# Patient Record
Sex: Male | Born: 1965
Health system: Southern US, Community
[De-identification: ages and names within clinical notes are randomized; demographics above are authoritative.]

## PROBLEM LIST (undated history)

## (undated) DIAGNOSIS — T7840XA Allergy, unspecified, initial encounter: Secondary | ICD-10-CM

## (undated) DIAGNOSIS — E119 Type 2 diabetes mellitus without complications: Secondary | ICD-10-CM

## (undated) DIAGNOSIS — R400 Somnolence: Principal | ICD-10-CM

## (undated) HISTORY — DX: Allergy, unspecified, initial encounter: T78.40XA

## (undated) HISTORY — DX: Somnolence: R40.0

---
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
## 2009-03-25 ENCOUNTER — Encounter (INDEPENDENT_AMBULATORY_CARE_PROVIDER_SITE_OTHER): Payer: Self-pay | Admitting: *Deleted

## 2009-04-11 ENCOUNTER — Encounter (INDEPENDENT_AMBULATORY_CARE_PROVIDER_SITE_OTHER): Payer: Self-pay | Admitting: *Deleted

## 2009-04-12 ENCOUNTER — Ambulatory Visit: Payer: Self-pay | Admitting: Gastroenterology

## 2009-04-16 ENCOUNTER — Encounter (INDEPENDENT_AMBULATORY_CARE_PROVIDER_SITE_OTHER): Payer: Self-pay | Admitting: *Deleted

## 2009-05-13 ENCOUNTER — Ambulatory Visit: Payer: Self-pay | Admitting: Gastroenterology

## 2010-06-10 NOTE — Procedures (Signed)
Summary: Colonoscopy  Patient: Alex Watson Note: All result statuses are Final unless otherwise noted.  Tests: (1) Colonoscopy (COL)   COL Colonoscopy           DONE     [facility]     [address]     [place], [place]  [address]           COLONOSCOPY PROCEDURE REPORT           PATIENT:  Armour, Villanueva  MR#:  [id number]     BIRTHDATE:  November 02, 1965, 43 yrs. old  GENDER:  male           ENDOSCOPIST:  Vania Rea. Jarold Motto, MD, [facility]     Referred by:           PROCEDURE DATE:  05/13/2009     PROCEDURE:  Higher-risk screening colonoscopy G0105           ASA CLASS:  Class I     INDICATIONS:  family history of colon cancer, history of     pre-cancerous (adenomatous) colon polyps           MEDICATIONS:   Fentanyl 100 mcg IV, Versed 10 mg IV           DESCRIPTION OF PROCEDURE:   After the risks benefits and     alternatives of the procedure were thoroughly explained, informed     consent was obtained.  Digital rectal exam was performed and     revealed no abnormalities.   The LB [id number]-[id number] endoscope     was introduced through the anus and advanced to the cecum, which     was identified by both the appendix and ileocecal valve, without     limitations.  The quality of the prep was excellent, using     Nulytley.  The instrument was then slowly withdrawn as the colon     was fully examined.     <<PROCEDUREIMAGES>>           FINDINGS:  External hemorrhoids were found.  No polyps or cancers     were seen.  This was otherwise a normal examination of the colon.     Retroflexed views in the rectum revealed no abnormalities.    The     scope was then withdrawn from the patient and the procedure     completed.           COMPLICATIONS:  None           ENDOSCOPIC IMPRESSION:     1) External hemorrhoids     2) No polyps or cancers     3) Otherwise normal examination     RECOMMENDATIONS:     1) high fiber diet     2) Given your significant family history of colon cancer,  you     should have a repeat colonoscopy in 5 years           REPEAT EXAM:  No           ______________________________     Vania Rea. Jarold Motto, MD, [facility]           CC:           n.     eSIGNED:   Vania Rea. Raima Geathers at 05/13/2009 11:04 AM           Olga Coaster, [id number]  Note: An exclamation mark (!) indicates a result that was not dispersed into the flowsheet. Document Creation Date:  05/13/2009 11:04 AM _______________________________________________________________________  (1) Order result status: Final Collection or observation date-time: 05/13/2009 10:48 Requested date-time:  Receipt date-time:  Reported date-time:  Referring Physician:   Ordering Physician: Sheryn Bison [phone]) Specimen Source:  Source: Launa Grill Order Number: [id number] Lab site:   Appended Document: Colonoscopy    Clinical Lists Changes  Observations: Added new observation of COLONNXTDUE: 05/2014 (05/13/2009 12:48)      Appended Document: Colonoscopy     Procedures Next Due Date:    Colonoscopy: 05/2014

## 2012-05-09 ENCOUNTER — Encounter: Payer: Self-pay | Admitting: Physician Assistant

## 2012-05-16 ENCOUNTER — Ambulatory Visit (INDEPENDENT_AMBULATORY_CARE_PROVIDER_SITE_OTHER): Payer: BC Managed Care – PPO | Admitting: Emergency Medicine

## 2012-05-16 ENCOUNTER — Ambulatory Visit: Payer: BC Managed Care – PPO

## 2012-05-16 ENCOUNTER — Encounter: Payer: Self-pay | Admitting: Emergency Medicine

## 2012-05-16 VITALS — BP 104/74 | HR 80 | Temp 98.7°F | Resp 16 | Ht 75.5 in | Wt 211.0 lb

## 2012-05-16 DIAGNOSIS — F17200 Nicotine dependence, unspecified, uncomplicated: Secondary | ICD-10-CM

## 2012-05-16 DIAGNOSIS — Z0000 Encounter for general adult medical examination without abnormal findings: Secondary | ICD-10-CM

## 2012-05-16 DIAGNOSIS — R0683 Snoring: Secondary | ICD-10-CM

## 2012-05-16 LAB — CBC WITH DIFFERENTIAL/PLATELET
Basophils Absolute: 0.1 10*3/uL (ref 0.0–0.1)
Basophils Relative: 1 % (ref 0–1)
Eosinophils Relative: 1 % (ref 0–5)
HCT: 45.7 % (ref 39.0–52.0)
MCHC: 35.4 g/dL (ref 30.0–36.0)
Monocytes Absolute: 0.7 10*3/uL (ref 0.1–1.0)
Neutro Abs: 5 10*3/uL (ref 1.7–7.7)
Platelets: 337 10*3/uL (ref 150–400)
RDW: 13.9 % (ref 11.5–15.5)

## 2012-05-16 LAB — POCT URINALYSIS DIPSTICK
Glucose, UA: NEGATIVE
Ketones, UA: NEGATIVE
Leukocytes, UA: NEGATIVE
Nitrite, UA: NEGATIVE

## 2012-05-16 LAB — POCT UA - MICROSCOPIC ONLY: Yeast, UA: NEGATIVE

## 2012-05-16 LAB — COMPREHENSIVE METABOLIC PANEL
AST: 15 U/L (ref 0–37)
Alkaline Phosphatase: 76 U/L (ref 39–117)
BUN: 14 mg/dL (ref 6–23)
Creat: 0.9 mg/dL (ref 0.50–1.35)
Potassium: 4.1 mEq/L (ref 3.5–5.3)

## 2012-05-16 LAB — LIPID PANEL
HDL: 34 mg/dL — ABNORMAL LOW (ref >39)
LDL Cholesterol: 108 mg/dL — ABNORMAL HIGH (ref 0–99)
Triglycerides: 74 mg/dL (ref <150)
VLDL: 15 mg/dL (ref 0–40)

## 2012-05-16 LAB — IFOBT (OCCULT BLOOD): IFOBT: NEGATIVE

## 2012-05-16 MED ORDER — VARENICLINE TARTRATE 1 MG PO TABS: 1.0000 mg | ORAL_TABLET | Freq: Two times a day (BID) | ORAL | Status: DC

## 2012-05-16 MED ORDER — VARENICLINE TARTRATE 0.5 MG PO TABS: 0.5000 mg | ORAL_TABLET | Freq: Two times a day (BID) | ORAL | Status: DC

## 2012-05-16 NOTE — Progress Notes (Deleted)
  Subjective:    Patient ID: Alex Watson, male    DOB: [birth year], 47 y.o.   MRN: [id number]  HPI    Review of Systems     Objective:   Physical Exam        Assessment & Plan:

## 2012-05-16 NOTE — Progress Notes (Signed)
  Subjective:    Patient ID: Alex Watson, male    DOB: [birth year], 47 y.o.   MRN: [id number]  HPI    Review of Systems  Constitutional: Positive for fatigue.  HENT: Negative.   Eyes: Positive for visual disturbance.  Respiratory: Positive for cough.   Cardiovascular: Negative.   Gastrointestinal: Positive for constipation.  Genitourinary: Negative.   Musculoskeletal: Positive for myalgias and arthralgias.  Skin: Negative.   Neurological: Negative.   Hematological: Negative.   Psychiatric/Behavioral: Negative.        Objective:   Physical Exam HEENT exam is normal. Pupils equal round regular react to light direct and consensual extraocular muscle movements are intact. Cranial nerves II through XII intact. Gums are inflamed. The pharynx is clear no oral lesions are seen. Neck is supple. Chest is clear to both auscultation and percussion. Cardiac exam reveals a regular rate with a 1/6 systolic murmur at the apical area of the heart and the abdomen is soft liver spleen are not enlarged there no areas of tenderness. GU exam reveals a normal male testicles descended no hernia felt. Echo exam reveals a normal prostate heme sure was done . There is tenderness over the left lateral epicondyles with pain with extension of the left wrist.  [facility] reading (PRIMARY) by  Dr. Cleta Alberts no acute disease Results for orders placed in visit on 05/16/12  IFOBT (OCCULT BLOOD)      Component Value Range   IFOBT Negative    POCT UA - MICROSCOPIC ONLY      Component Value Range   WBC, Ur, HPF, POC 0-2     RBC, urine, microscopic 0-2     Bacteria, U Microscopic neg     Mucus, UA neg     Epithelial cells, urine per micros 0-1     Crystals, Ur, HPF, POC neg     Casts, Ur, LPF, POC neg     Yeast, UA neg    POCT URINALYSIS DIPSTICK      Component Value Range   Color, UA yellow     Clarity, UA clear     Glucose, UA neg     Bilirubin, UA small     Ketones, UA neg     Spec Grav, UA 1.020     Blood, UA neg      pH, UA 6.0     Protein, UA neg     Urobilinogen, UA 0.2     Nitrite, UA neg     Leukocytes, UA Negative     PFT normal      Assessment & Plan:  Patient had routine labs done but I have added celiac panel because of a family history of celiac disease. We'll try Chantix to see if we can get him off cigarettes. Referral is made to the sleep center for evaluation of sleep disorder. Regarding his left elbow pain I suggested he get an elbow strap and ice the area around the lateral epicondyle after work.

## 2012-05-17 LAB — GLIA (IGA/G) + TTG IGA
Gliadin IgA: 12.3 U/mL (ref <20)
Gliadin IgG: 67.6 U/mL — ABNORMAL HIGH (ref <20)
Tissue Transglutaminase Ab, IgA: 6.3 U/mL (ref <20)

## 2012-05-26 ENCOUNTER — Telehealth: Payer: Self-pay | Admitting: Radiology

## 2012-05-26 NOTE — Telephone Encounter (Signed)
Referrals received back the referral request for this patient because not enough in formation in chart for snoring as the diagnosis for the sleep study please let me know when it is added so i can resend the information so they can schedule Please advise, is there any further information we can provide to get this scheduled for him, you may need to do an addendum to the office visit.

## 2012-05-27 NOTE — Telephone Encounter (Signed)
Can you add this information in? Or do we need to do an addendum?

## 2012-05-27 NOTE — Telephone Encounter (Signed)
The patient is also having difficulty with sleeping. He also has issues with fatigue during the day. The study will be done to rule out sleep apnea.

## 2012-09-08 ENCOUNTER — Ambulatory Visit: Payer: BC Managed Care – PPO

## 2012-09-08 ENCOUNTER — Ambulatory Visit (INDEPENDENT_AMBULATORY_CARE_PROVIDER_SITE_OTHER): Payer: BC Managed Care – PPO | Admitting: Family Medicine

## 2012-09-08 VITALS — BP 114/60 | HR 87 | Temp 98.3°F | Resp 16 | Ht 77.5 in | Wt 223.0 lb

## 2012-09-08 DIAGNOSIS — M25462 Effusion, left knee: Secondary | ICD-10-CM

## 2012-09-08 DIAGNOSIS — M25562 Pain in left knee: Secondary | ICD-10-CM

## 2012-09-08 DIAGNOSIS — M25469 Effusion, unspecified knee: Secondary | ICD-10-CM

## 2012-09-08 DIAGNOSIS — M25569 Pain in unspecified knee: Secondary | ICD-10-CM

## 2012-09-08 MED ORDER — NAPROXEN 500 MG PO TABS: 500.0000 mg | ORAL_TABLET | Freq: Two times a day (BID) | ORAL | Status: DC

## 2012-09-08 MED ORDER — CYCLOBENZAPRINE HCL 10 MG PO TABS: 10.0000 mg | ORAL_TABLET | Freq: Every evening | ORAL | Status: DC | PRN

## 2012-09-08 NOTE — Progress Notes (Signed)
[facility]:  Office Visit  Chief Complaint:  Chief Complaint  Patient presents with  . Knee Pain    L knee x 12 days    HPI: Alex Watson is a 47 y.o. male who complains of left knee pain x 12 days. NKI. [place]y, burning pain 5/10. Tried Tylenol without relief. Can't sleep. Constantly sore. No relief. Drives very often 6-10 hrs. No calf pain. Mild swelling at knee. No prior surgeries/injuries. No instability, no crepitus, no popping, clicking. Works for [employer] so he travels a lot and has been away down in [place] and also [place] since weather has been bad.   Past Medical History  Diagnosis Date  . Allergy    Past Surgical History  Procedure Laterality Date  . Mandible surgery  1987   History   Social History  . Marital Status: Married    Spouse Name: N/A    Number of Children: N/A  . Years of Education: N/A   Social History Main Topics  . Smoking status: Current Every Day Smoker -- 24 years    Types: Cigarettes  . Smokeless tobacco: None  . Alcohol Use: Yes     Comment: BEER  . Drug Use: No  . Sexually Active: Yes     Comment: number of sex partners in the last 12 months 1   Other Topics Concern  . None   Social History Narrative  . None   Family History  Problem Relation Age of Onset  . Asthma Mother   . Arthritis Mother   . COPD Father   . Heart disease Sister     eisenberger syndrome  . Lupus Sister    Allergies  Allergen Reactions  . Morphine     REACTION: difficulty breathing   Prior to Admission medications   Medication Sig Start Date End Date Taking? Authorizing Provider  diphenhydramine-acetaminophen (TYLENOL PM) 25-500 MG TABS Take 1 tablet by mouth at bedtime as needed.   Yes Historical Provider, MD  varenicline (CHANTIX CONTINUING MONTH PAK) 1 MG tablet Take 1 tablet (1 mg total) by mouth 2 (two) times daily. 05/16/12   Collene Gobble, MD  varenicline (CHANTIX) 0.5 MG tablet Take 1 tablet (0.5 mg total) by mouth 2 (two)  times daily. 05/16/12   Collene Gobble, MD     ROS: The patient denies fevers, chills, night sweats, unintentional weight loss, chest pain, palpitations, wheezing, dyspnea on exertion, nausea, vomiting, abdominal pain, dysuria, hematuria, melena, numbness, weakness, or tingling.   All other systems have been reviewed and were otherwise negative with the exception of those mentioned in the HPI and as above.    PHYSICAL EXAM: Filed Vitals:   09/08/12 1541  BP: 114/60  Pulse: 87  Temp: 98.3 F (36.8 C)  Resp: 16   Filed Vitals:   09/08/12 1541  Height: 6' 5.5" (1.969 m)  Weight: 223 lb (101.152 kg)   Body mass index is 26.09 kg/(m^2).  General: Alert, no acute distress HEENT:  Normocephalic, atraumatic, oropharynx patent.  Cardiovascular:  Regular rate and rhythm, no rubs murmurs or gallops.  No Carotid bruits, radial pulse intact. No pedal edema.  Respiratory: Clear to auscultation bilaterally.  No wheezes, rales, or rhonchi.  No cyanosis, no use of accessory musculature GI: No organomegaly, abdomen is soft and non-tender, positive bowel sounds.  No masses. Skin: No rashes. Neurologic: Facial musculature symmetric. Psychiatric: Patient is appropriate throughout our interaction. Lymphatic: No cervical lymphadenopathy Musculoskeletal: Gait intact. Left knee-no  deformities, minimal  Medial effusion,no crepitus + tender at jt line ( minimal), tender at MCL, stable to varus and valgus stress Full ROM, 5/5 strength Sensation intact 2/2 DTRs knees, ankles   LABS: Results for orders placed in visit on 05/16/12  CBC WITH DIFFERENTIAL      Result Value Range   WBC 8.4  4.0 - 10.5 K/uL   RBC 4.97  4.22 - 5.81 MIL/uL   Hemoglobin 16.2  13.0 - 17.0 g/dL   HCT 69.6  29.5 - 28.4 %   MCV 92.0  78.0 - 100.0 fL   MCH 32.6  26.0 - 34.0 pg   MCHC 35.4  30.0 - 36.0 g/dL   RDW 13.2  44.0 - 10.2 %   Platelets 337  150 - 400 K/uL   Neutrophils Relative 60  43 - 77 %   Neutro Abs 5.0  1.7 -  7.7 K/uL   Lymphocytes Relative 30  12 - 46 %   Lymphs Abs 2.5  0.7 - 4.0 K/uL   Monocytes Relative 8  3 - 12 %   Monocytes Absolute 0.7  0.1 - 1.0 K/uL   Eosinophils Relative 1  0 - 5 %   Eosinophils Absolute 0.1  0.0 - 0.7 K/uL   Basophils Relative 1  0 - 1 %   Basophils Absolute 0.1  0.0 - 0.1 K/uL   Smear Review Criteria for review not met    COMPREHENSIVE METABOLIC PANEL      Result Value Range   Sodium 139  135 - 145 mEq/L   Potassium 4.1  3.5 - 5.3 mEq/L   Chloride 103  96 - 112 mEq/L   CO2 27  19 - 32 mEq/L   Glucose, Bld 91  70 - 99 mg/dL   BUN 14  6 - 23 mg/dL   Creat 7.25  3.66 - 4.40 mg/dL   Total Bilirubin 0.9  0.3 - 1.2 mg/dL   Alkaline Phosphatase 76  39 - 117 U/L   AST 15  0 - 37 U/L   ALT 10  0 - 53 U/L   Total Protein 6.6  6.0 - 8.3 g/dL   Albumin 4.5  3.5 - 5.2 g/dL   Calcium 9.4  8.4 - 34.7 mg/dL  TSH      Result Value Range   TSH 1.251  0.350 - 4.500 uIU/mL  LIPID PANEL      Result Value Range   Cholesterol 157  0 - 200 mg/dL   Triglycerides 74  <425 mg/dL   HDL 34 (*) >95 mg/dL   Total CHOL/HDL Ratio 4.6     VLDL 15  0 - 40 mg/dL   LDL Cholesterol 638 (*) 0 - 99 mg/dL  PSA      Result Value Range   PSA 0.74  <=4.00 ng/mL  CELIAC PANEL      Result Value Range   Tissue Transglutaminase Ab, IgA 6.3  <20 U/mL   Gliadin IgG 67.6 (*) <20 U/mL   Gliadin IgA 12.3  <20 U/mL  IFOBT (OCCULT BLOOD)      Result Value Range   IFOBT Negative    POCT UA - MICROSCOPIC ONLY      Result Value Range   WBC, Ur, HPF, POC 0-2     RBC, urine, microscopic 0-2     Bacteria, U Microscopic neg     Mucus, UA neg     Epithelial cells, urine per micros 0-1  Crystals, Ur, HPF, POC neg     Casts, Ur, LPF, POC neg     Yeast, UA neg    POCT URINALYSIS DIPSTICK      Result Value Range   Color, UA yellow     Clarity, UA clear     Glucose, UA neg     Bilirubin, UA small     Ketones, UA neg     Spec Grav, UA 1.020     Blood, UA neg     pH, UA 6.0     Protein, UA  neg     Urobilinogen, UA 0.2     Nitrite, UA neg     Leukocytes, UA Negative       EKG/XRAY:   Primary read interpreted by Dr. Conley Rolls at [facility]. No fractures or dislocation  ? Minimal medial jt line narrowing otherwise negative xray   ASSESSMENT/PLAN: Encounter Diagnoses  Name Primary?  . Knee pain, acute, left Yes  . Knee swelling, left    Most likely arthritis vs sprain and strain of left knee with involvement of MCL vs ? Meniscus , meniscus injury unlikey due to [employer].  He drive a lot and sit in his car for long periods of time in one position, ie 7-10 hrs, does not stretch feels better when he stretches Advise to get out more frequently ie every 1-2 hrs to stretch and also to prevent risk of DVTs Rx Brace in house Rx Naproxen, Flexeril  Risks and benefits of medicine given F/u prn steroid injection if no improvement in 2-4 weeks    Lorin Hauck PHUONG, DO 09/08/2012 5:11 PM    Fit and train for hinged knee brace-Erin Cardwell, CMA

## 2012-10-04 ENCOUNTER — Encounter: Payer: Self-pay | Admitting: Family Medicine

## 2013-03-07 ENCOUNTER — Ambulatory Visit (INDEPENDENT_AMBULATORY_CARE_PROVIDER_SITE_OTHER): Payer: BC Managed Care – PPO | Admitting: Neurology

## 2013-03-07 ENCOUNTER — Encounter: Payer: Self-pay | Admitting: Neurology

## 2013-03-07 VITALS — BP 129/61 | HR 69 | Temp 98.5°F | Ht 75.5 in | Wt 231.0 lb

## 2013-03-07 DIAGNOSIS — R400 Somnolence: Secondary | ICD-10-CM

## 2013-03-07 DIAGNOSIS — R404 Transient alteration of awareness: Secondary | ICD-10-CM

## 2013-03-07 DIAGNOSIS — M25561 Pain in right knee: Secondary | ICD-10-CM

## 2013-03-07 DIAGNOSIS — R0609 Other forms of dyspnea: Secondary | ICD-10-CM

## 2013-03-07 DIAGNOSIS — M25569 Pain in unspecified knee: Secondary | ICD-10-CM

## 2013-03-07 DIAGNOSIS — F17200 Nicotine dependence, unspecified, uncomplicated: Secondary | ICD-10-CM

## 2013-03-07 DIAGNOSIS — M542 Cervicalgia: Secondary | ICD-10-CM

## 2013-03-07 DIAGNOSIS — Z789 Other specified health status: Secondary | ICD-10-CM

## 2013-03-07 DIAGNOSIS — M549 Dorsalgia, unspecified: Secondary | ICD-10-CM

## 2013-03-07 DIAGNOSIS — R0683 Snoring: Secondary | ICD-10-CM

## 2013-03-07 DIAGNOSIS — Z9189 Other specified personal risk factors, not elsewhere classified: Secondary | ICD-10-CM

## 2013-03-07 HISTORY — DX: Somnolence: R40.0

## 2013-03-07 NOTE — Patient Instructions (Addendum)
Based on your symptoms and your exam I believe you are at risk for obstructive sleep apnea or OSA, and I think we should proceed with a sleep study to determine whether you do or do not have OSA and how severe it is. If you have more than mild OSA, I want you to consider treatment with CPAP. Please remember, the risks and ramifications of moderate to severe obstructive sleep apnea or OSA are: Cardiovascular disease, including congestive heart failure, stroke, difficult to control hypertension, arrhythmias, and even type 2 diabetes has been linked to untreated OSA. Sleep apnea causes disruption of sleep and sleep deprivation in most cases, which, in turn, can cause recurrent headaches, problems with memory, mood, concentration, focus, and vigilance. Most people with untreated sleep apnea report excessive daytime sleepiness, which can affect their ability to drive. Please do not drive if you feel sleepy.  I will see you back after your sleep study to go over the test results and where to go from there. We will call you after your sleep study and to set up an appointment at the time.   Furthermore, you may have a sleep disorder with excessive daytime sleepiness and I would like to proceed with a nap study as well.   In preparation of your sleep studies, gradually reduce the use of tylenol PM, so you don't have to take it for the nighttime study. Also, gradually reduce your caffeine intake to where you don't have caffeine after 3 PM.   Please remember to try to maintain good sleep hygiene, which means: Keep a regular sleep and wake schedule, try not to exercise or have a meal within 2 hours of your bedtime, try to keep your bedroom conducive for sleep, that is, cool and dark, without light distractors such as an illuminated alarm clock, and refrain from watching TV right before sleep or in the middle of the night and do not keep the TV or radio on during the night. Also, try not to use or play on electronic  devices at bedtime, such as your cell phone, tablet PC or laptop. If you like to read at bedtime on an electronic device, try to dim the background light as much as possible.

## 2013-03-07 NOTE — Progress Notes (Signed)
Subjective:    Patient ID: Alex Watson is a 47 y.o. male.  HPI  Alex Foley, MD, PhD [facility] [address], [address] [address] [place], [place] [address]  Dear Dr. Cleta Alberts,   I saw your patient, Alex Watson, upon your kind request in my neurologic clinic today for initial consultation of his sleep disorder, in particular, concern for obstructive sleep apnea. The patient is accompanied by his wife, Alex Watson, today. As you know, Alex Watson is a very friendly 47 year old right-handed gentleman with an underlying medical history of allergies and smoking who presents with a history of snoring and daytime tiredness. As a teenager he was told he had sleep phase delay. He has had issues with sleep maintenance since childhood.   He works for a [employer] and can be called out at any time. His regular work hours are from 7 AM to 5-6 PM.  His typical bedtime is reported to be around 10 to 11 PM and usual wake time is around 5 to 6 AM. Sleep onset typically occurs within a few minutes. He reports feeling poorly rested upon awakening and feels sluggish for 30 minutes. He wakes up on an average 0 times in the middle of the night and has to go to the bathroom 0 times on a typical night. He does not achieve more than 6 hours of sleep, irrespective of when he goes to sleep. He denies morning headaches.   He reports excessive daytime somnolence (EDS) and His Epworth Sleepiness Score (ESS) is 13/24 today. He has fallen asleep while driving once or twice, but thankfully has not had an accident. He has had veering off the lane. The patient has not been taking a planned nap, but falls asleep in the chair after coming home from work.    He has been known to snore for the past few years. Snoring is reportedly moderate, and rarely associated with choking sounds and witnessed apneas. The patient denies a sense of choking or strangling feeling. There is report of nighttime reflux,  with rare nighttime cough experienced. The patient has not noted any RLS symptoms but is known to kick while asleep or before falling asleep. There is no family history of RLS or OSA.  He is not a very restless sleeper.   He denies cataplexy, sleep paralysis, hypnagogic or hypnopompic hallucinations, or sleep attacks. He does report vivid dreams, and has had dream enactments, and parasomnias, primarily sleep talking and used to sleep walk as a child. The patient has not had a sleep study or a home sleep test.  He consumes 6 to 9 caffeinated beverages per day, usually in the form of 3 cups of coffee in the morning, sodas during the day (2-3), and tea as late as bedtime.  His bedroom is usually dark and cool. There is a TV in the bedroom and usually it is not on at night. He has been taking Tylenol PM each night, mainly for knee pain, and neck and back pain. He indicates, he does not really need the PM part of the medication.   His Past Medical History Is Significant For: Past Medical History  Diagnosis Date  . Allergy     His Past Surgical History Is Significant For: Past Surgical History  Procedure Laterality Date  . Mandible surgery  1987    His Family History Is Significant For: Family History  Problem Relation Age of Onset  . Asthma Mother   . Arthritis Mother   .  COPD Father   . Heart disease Sister     eisenberger syndrome  . Lupus Sister     His Social History Is Significant For: History   Social History  . Marital Status: Married    Spouse Name: N/A    Number of Children: N/A  . Years of Education: N/A   Social History Main Topics  . Smoking status: Current Every Day Smoker -- 24 years    Types: Cigarettes  . Smokeless tobacco: None  . Alcohol Use: Yes     Comment: BEER  . Drug Use: No  . Sexual Activity: Yes     Comment: number of sex partners in the last 12 months 1   Other Topics Concern  . None   Social History Narrative  . None    His Allergies Are:   Allergies  Allergen Reactions  . Morphine     REACTION: difficulty breathing  :   His Current Medications Are:  Outpatient Encounter Prescriptions as of 03/07/2013  Medication Sig Dispense Refill  . cyclobenzaprine (FLEXERIL) 10 MG tablet Take 1 tablet (10 mg total) by mouth at bedtime as needed for muscle spasms.  30 tablet  0  . diphenhydramine-acetaminophen (TYLENOL PM) 25-500 MG TABS Take 1 tablet by mouth at bedtime as needed.      . naproxen (NAPROSYN) 500 MG tablet Take 1 tablet (500 mg total) by mouth 2 (two) times daily with a meal. No other NSAIDs with this medication  30 tablet  0  . varenicline (CHANTIX CONTINUING MONTH PAK) 1 MG tablet Take 1 tablet (1 mg total) by mouth 2 (two) times daily.  60 tablet  1  . varenicline (CHANTIX) 0.5 MG tablet Take 1 tablet (0.5 mg total) by mouth 2 (two) times daily.  60 tablet  0   No facility-administered encounter medications on file as of 03/07/2013.  :  Review of Systems:  Out of a complete 14 point review of systems, all are reviewed and negative with the exception of these symptoms as listed below:  Review of Systems  Constitutional: Positive for fatigue.  HENT: Negative.   Eyes: Positive for visual disturbance (blurred vision).  Respiratory:       Snoring  Cardiovascular: Negative.   Gastrointestinal: Negative.   Endocrine: Negative.   Genitourinary: Negative.   Musculoskeletal: Positive for myalgias.  Skin: Negative.   Allergic/Immunologic: Negative.   Neurological: Negative.   Hematological: Negative.   Psychiatric/Behavioral: Positive for sleep disturbance.    Objective:  Neurologic Exam  Physical Exam Physical Examination:   Filed Vitals:   03/07/13 0833  BP: 129/61  Pulse: 69  Temp: 98.5 F (36.9 C)    General Examination: The patient is a very pleasant 47 y.o. male in no acute distress. He appears well-developed and well-nourished and adequately groomed.   HEENT: Normocephalic, atraumatic, pupils are  equal, round and reactive to light and accommodation. Funduscopic exam is normal with sharp disc margins noted. Extraocular tracking is good without limitation to gaze excursion or nystagmus noted. Normal smooth pursuit is noted. Hearing is grossly intact. Tympanic membranes are clear bilaterally. Face is symmetric with normal facial animation and normal facial sensation. Speech is clear with no dysarthria noted. There is no hypophonia. There is no lip, neck/head, jaw or voice tremor. Neck is supple with full range of passive and active motion. There are no carotid bruits on auscultation. Oropharynx exam reveals: mild mouth dryness, adequate dental hygiene and moderate airway crowding, due to enlarged tongue, narrow  airway entry. Mallampati is class II. Tongue protrudes centrally and palate elevates symmetrically. Tonsils are 1+. Neck size is 16 3/8 inches.   Chest: Clear to auscultation without wheezing, rhonchi or crackles noted.  Heart: S1+S2+0, regular and normal without murmurs, rubs or gallops noted.   Abdomen: Soft, non-tender and non-distended with normal bowel sounds appreciated on auscultation.  Extremities: There is no pitting edema in the distal lower extremities bilaterally. Pedal pulses are intact.  Skin: Warm and dry without trophic changes noted. There are no varicose veins.  Musculoskeletal: exam reveals no obvious joint deformities, tenderness or joint swelling or erythema.   Neurologically:  Mental status: The patient is awake, alert and oriented in all 4 spheres. His memory, attention, language and knowledge are appropriate. There is no aphasia, agnosia, apraxia or anomia. Speech is clear with normal prosody and enunciation. Thought process is linear. Mood is congruent and affect is normal.  Cranial nerves are as described above under HEENT exam. In addition, shoulder shrug is normal with equal shoulder height noted. Motor exam: Normal bulk, strength and tone is noted. There is  no drift, tremor or rebound. Romberg is negative. Reflexes are 2+ throughout. Toes are downgoing bilaterally. Fine motor skills are intact with normal finger taps, normal hand movements, normal rapid alternating patting, normal foot taps and normal foot agility.  Cerebellar testing shows no dysmetria or intention tremor on finger to nose testing. Heel to shin is unremarkable bilaterally. There is no truncal or gait ataxia.  Sensory exam is intact to light touch, pinprick, vibration, temperature sense and proprioception in the upper and lower extremities.  Gait, station and balance are unremarkable. No veering to one side is noted. No leaning to one side is noted. Posture is age-appropriate and stance is narrow based. No problems turning are noted. He turns en bloc. Tandem walk is unremarkable. Intact toe and heel stance is noted.               Assessment and Plan:   In summary, Alex Watson is a very pleasant 47 y.o.-year old male with a history of severe sleepiness and while he may have underlying obstructive sleep apnea (OSA), his history is not telltale for severe OSA. He is not obese either. He has an ESS of 13 despite consuming a lot of caffeine. His daytime somnolence seems out of proportion to his potential underlying sleep related breathing disorder. He does not have historical features consistent with narcolepsy but may have idiopathic hypersomnolence. I had a long chat with the patient and his wife about my findings and the diagnosis of OSA, its prognosis and treatment options. We talked also talked about hypersomnolence disorders, and theier medical treatments and non-pharmacological approaches. We talked about improving sleep hygiene. I also explained in particular the risks and ramifications of untreated moderate to severe OSA, especially with respect to developing cardiovascular disease down the Road, including congestive heart failure, difficult to treat hypertension, cardiac arrhythmias, or  stroke. Even type 2 diabetes has in part been linked to untreated OSA. We talked about smoking cessation and trying to maintain a healthy lifestyle in general, as well as the importance of weight control. I encouraged the patient to eat healthy, exercise daily and keep well hydrated, to keep a scheduled bedtime and wake time routine, to not skip any meals and eat healthy snacks in between meals.  I recommended the following at this time: sleep study with subsequent nap study, unless he has OSA. He has been taking Tylenol  PM each night, mainly for knee pain, and neck and back pain.  In preparation of his sleep studies, I asked him to gradually reduce the use of tylenol PM, so he does not have to take it for the nighttime study; he can take regular tylenol. Also, he is advised to gradually reduce his caffeine intake to where he does not have caffeine after 3 PM.   I explained the sleep test procedure to the patient and also outlined possible surgical and non-surgical treatment options of OSA, including the use of a custom-made dental device, upper airway surgical options, such as pillar implants, radiofrequency surgery, tongue base surgery, and UPPP. I also explained the CPAP treatment option to the patient, who indicated that he would be willing to try CPAP if the need arises. I explained the importance of being compliant with PAP treatment, not only for insurance purposes but primarily to improve His symptoms, and for the patient's long term health benefit, including to reduce His cardiovascular risks. I answered all their questions today and the patient and his wife were in agreement. I would like to see him back after the sleep studies are completed and encouraged them to call with any interim questions, concerns, problems or updates.   Thank you very much for allowing me to participate in the care of this nice patient. If I can be of any further assistance to you please do not hesitate to call me at  [phone].  Sincerely,   Alex Foley, MD, PhD

## 2013-03-08 ENCOUNTER — Telehealth: Payer: Self-pay | Admitting: Neurology

## 2013-03-08 NOTE — Telephone Encounter (Signed)
Wife called to cancel pt's sleep study appointment due to high deductible.  Unable to afford it at this time and will call back after new benefit enrollment period.  Wife reports that the patient did lower his caffeine intake and there is noticeable improvement with his sleep.

## 2013-08-02 ENCOUNTER — Ambulatory Visit (INDEPENDENT_AMBULATORY_CARE_PROVIDER_SITE_OTHER): Payer: BC Managed Care – PPO | Admitting: Emergency Medicine

## 2013-08-02 VITALS — BP 106/70 | HR 92 | Temp 98.0°F | Resp 16 | Ht 76.0 in | Wt 220.0 lb

## 2013-08-02 DIAGNOSIS — L03319 Cellulitis of trunk, unspecified: Principal | ICD-10-CM

## 2013-08-02 DIAGNOSIS — M25519 Pain in unspecified shoulder: Secondary | ICD-10-CM

## 2013-08-02 DIAGNOSIS — L02219 Cutaneous abscess of trunk, unspecified: Secondary | ICD-10-CM

## 2013-08-02 MED ORDER — SULFAMETHOXAZOLE-TMP DS 800-160 MG PO TABS: 1.0000 | ORAL_TABLET | Freq: Two times a day (BID) | ORAL | Status: DC

## 2013-08-02 NOTE — Progress Notes (Signed)
   Patient ID: Alex Watson MRN: [id number], DOB: 31-May-1965, 48 y.o. Date of Encounter: 08/02/2013, 9:31 AM    PROCEDURE NOTE: Verbal consent obtained. Risks and benefits of the procedure were explained to the patient. Patient made an informed decision to proceed with the procedure. Betadine prep per usual protocol. Local anesthesia obtained with 2% lidocaine with epi 2 cc.  1 cm incision made with 11 blade along lesion.  Culture taken. Moderate purulence expressed. Lesion explored revealing no loculations. Irrigated with lidocaine. Packed with 1/4 inch plain packing. Dressed. Wound care instructions including precautions with patient. Patient tolerated the procedure well. Patient is traveling to [place] for the next week. He prefers to not seek follow up while there. He will remove the packing in 48 hours and do salt water soaks following. He was given syringes for this. Follow up if any concerns.       Signed, Eula Listenyan Durk Carmen, MHS, PA-C [facility] [place], [place] [id number] [phone] [facility] 08/02/2013 9:31 AM

## 2013-08-02 NOTE — Patient Instructions (Signed)
Abscess An abscess is an infected area that contains a collection of pus and debris.It can occur in almost any part of the body. An abscess is also known as a furuncle or boil. CAUSES  An abscess occurs when tissue gets infected. This can occur from blockage of oil or sweat glands, infection of hair follicles, or a minor injury to the skin. As the body tries to fight the infection, pus collects in the area and creates pressure under the skin. This pressure causes pain. People with weakened immune systems have difficulty fighting infections and get certain abscesses more often.  SYMPTOMS Usually an abscess develops on the skin and becomes a painful mass that is red, warm, and tender. If the abscess forms under the skin, you may feel a moveable soft area under the skin. Some abscesses break open (rupture) on their own, but most will continue to get worse without care. The infection can spread deeper into the body and eventually into the bloodstream, causing you to feel ill.  DIAGNOSIS  Your caregiver will take your medical history and perform a physical exam. A sample of fluid may also be taken from the abscess to determine what is causing your infection. TREATMENT  Your caregiver may prescribe antibiotic medicines to fight the infection. However, taking antibiotics alone usually does not cure an abscess. Your caregiver may need to make a small cut (incision) in the abscess to drain the pus. In some cases, gauze is packed into the abscess to reduce pain and to continue draining the area. HOME CARE INSTRUCTIONS   Only take over-the-counter or prescription medicines for pain, discomfort, or fever as directed by your caregiver.  If you were prescribed antibiotics, take them as directed. Finish them even if you start to feel better.  If gauze is used, follow your caregiver's directions for changing the gauze.  To avoid spreading the infection:  Keep your draining abscess covered with a  bandage.  Wash your hands well.  Do not share personal care items, towels, or whirlpools with others.  Avoid skin contact with others.  Keep your skin and clothes clean around the abscess.  Keep all follow-up appointments as directed by your caregiver. SEEK MEDICAL CARE IF:   You have increased pain, swelling, redness, fluid drainage, or bleeding.  You have muscle aches, chills, or a general ill feeling.  You have a fever. MAKE SURE YOU:   Understand these instructions.  Will watch your condition.  Will get help right away if you are not doing well or get worse. Document Released: 02/04/2005 Document Revised: 10/27/2011 Document Reviewed: 07/10/2011 Exit[employer] Patient Information 2014 [employer].  

## 2013-08-02 NOTE — Progress Notes (Signed)
[facility] [address], [place] [address] [phone]  Date:  08/02/2013   Name:  Alex Decaul H Ventola   DOB:  Sep 19, 1965   MRN:  [id number]  PCP:  No PCP Per Patient    Chief Complaint: cyst   History of Present Illness:  Alex Watson is a 48 y.o. very pleasant male patient who presents with the following:  Patient has been treating an abscess on the posterior right shoulder for several days with heat.  No fever or chills.  No improvement with over the counter medications or other home remedies. Denies other complaint or health concern today.   Patient Active Problem List   Diagnosis Date Noted  . Uncontrolled daytime somnolence 03/07/2013    Past Medical History  Diagnosis Date  . Allergy   . Uncontrolled daytime somnolence 03/07/2013    Past Surgical History  Procedure Laterality Date  . Mandible surgery  1987    History  Substance Use Topics  . Smoking status: Current Every Day Smoker -- 24 years    Types: Cigarettes  . Smokeless tobacco: Not on file  . Alcohol Use: Yes     Comment: BEER    Family History  Problem Relation Age of Onset  . Asthma Mother   . Arthritis Mother   . COPD Father   . Heart disease Sister     eisenberger syndrome  . Lupus Sister     Allergies  Allergen Reactions  . Morphine     REACTION: difficulty breathing    Medication list has been reviewed and updated.  Current Outpatient Prescriptions on File Prior to Visit  Medication Sig Dispense Refill  . cyclobenzaprine (FLEXERIL) 10 MG tablet Take 1 tablet (10 mg total) by mouth at bedtime as needed for muscle spasms.  30 tablet  0  . diphenhydramine-acetaminophen (TYLENOL PM) 25-500 MG TABS Take 1 tablet by mouth at bedtime as needed.      . naproxen (NAPROSYN) 500 MG tablet Take 1 tablet (500 mg total) by mouth 2 (two) times daily with a meal. No other NSAIDs with this medication  30 tablet  0  . varenicline (CHANTIX CONTINUING MONTH PAK) 1 MG tablet Take  1 tablet (1 mg total) by mouth 2 (two) times daily.  60 tablet  1  . varenicline (CHANTIX) 0.5 MG tablet Take 1 tablet (0.5 mg total) by mouth 2 (two) times daily.  60 tablet  0   No current facility-administered medications on file prior to visit.    Review of Systems:  As per HPI, otherwise negative.    Physical Examination: Filed Vitals:   08/02/13 0828  BP: 106/70  Pulse: 92  Temp: 98 F (36.7 C)  Resp: 16   Filed Vitals:   08/02/13 0828  Height: 6\' 4"  (1.93 m)  Weight: 220 lb (99.791 kg)   Body mass index is 26.79 kg/(m^2). Ideal Body Weight: Weight in (lb) to have BMI = 25: 205   GEN: WDWN, NAD, Non-toxic, Alert & Oriented x 3 HEENT: Atraumatic, Normocephalic.  Ears and Nose: No external deformity. EXTR: No clubbing/cyanosis/edema NEURO: Normal gait.  PSYCH: Normally interactive. Conversant. Not depressed or anxious appearing.  Calm demeanor.  1.5 cm abscess posterior shoulder.    Assessment and Plan: Abscess I&D Septra  Signed,  Phillips OdorJeffery Mikko Lewellen, MD

## 2013-08-04 LAB — WOUND CULTURE
GRAM STAIN: NONE SEEN
GRAM STAIN: NONE SEEN
ORGANISM ID, BACTERIA: NO GROWTH

## 2013-12-06 ENCOUNTER — Ambulatory Visit (INDEPENDENT_AMBULATORY_CARE_PROVIDER_SITE_OTHER): Payer: BC Managed Care – PPO | Admitting: Family Medicine

## 2013-12-06 ENCOUNTER — Ambulatory Visit (INDEPENDENT_AMBULATORY_CARE_PROVIDER_SITE_OTHER): Payer: BC Managed Care – PPO

## 2013-12-06 VITALS — BP 134/82 | HR 87 | Temp 98.0°F | Resp 16 | Ht 76.0 in | Wt 227.8 lb

## 2013-12-06 DIAGNOSIS — R0781 Pleurodynia: Secondary | ICD-10-CM

## 2013-12-06 DIAGNOSIS — F17200 Nicotine dependence, unspecified, uncomplicated: Secondary | ICD-10-CM

## 2013-12-06 DIAGNOSIS — R071 Chest pain on breathing: Secondary | ICD-10-CM

## 2013-12-06 DIAGNOSIS — R079 Chest pain, unspecified: Secondary | ICD-10-CM

## 2013-12-06 NOTE — Progress Notes (Signed)
Subjective: 48 year old male who comes in complaining of chest discomfort. He has had problems over the last week and a half. He had pain in the left low anterior chest, then in the right lower anterior chest, back and forth at different times. Then he developed a more persistent pain in the right chest which was quite bad yesterday. It hurt every time he took a deep breath or cough. He is not wheezing. He does smoke a pack a day, has done so for many years. 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 He does not get a lot of regular exercise. No GI complaints. He would like to stop smoking, but has not made any serious attempts. He had a prescription for Chantix but never took it. He has known others such as his father he did well with that and some coworkers who in Made psychotic, so he has mixed feelings about it.  Objective: Relatively healthy-appearing man in no acute distress. TMs normal. Throat clear. Neck supple without significant nodes. Chest is clear to percussion and auscultation. Heart regular without murmurs. Abdomen soft and nontender.  Assessment: Chest pain, primarily right chest with pleural component Tobacco use disorder  Plan: Chest x-ray and EKG  EKG normal  [facility] reading (PRIMARY) by  Dr. Alwyn RenHopper Probably normal, prominent rll markings.   NSAIDs for pleural pain  8 minute discussion regarding tobacco cessation  Return as needed  If he decides he needs the Chantix he will come back.

## 2013-12-06 NOTE — Patient Instructions (Signed)
Take Aleve 2 twice daily for one to 2 weeks  If lung symptoms persist please return  Think seriously about stopping smoking. Tuesday quit date and do so. If you still feel like Chantix may be a need for you can return about 10-14 days before your designated quit date

## 2014-06-29 ENCOUNTER — Encounter: Payer: Self-pay | Admitting: Gastroenterology

## 2014-07-25 DIAGNOSIS — Z0289 Encounter for other administrative examinations: Secondary | ICD-10-CM

## 2014-08-17 ENCOUNTER — Ambulatory Visit (INDEPENDENT_AMBULATORY_CARE_PROVIDER_SITE_OTHER): Payer: BLUE CROSS/BLUE SHIELD | Admitting: Physician Assistant

## 2014-08-17 VITALS — BP 100/64 | HR 111 | Temp 98.6°F | Resp 16 | Ht 78.0 in | Wt 216.0 lb

## 2014-08-17 DIAGNOSIS — R3589 Other polyuria: Secondary | ICD-10-CM

## 2014-08-17 DIAGNOSIS — R358 Other polyuria: Secondary | ICD-10-CM | POA: Diagnosis not present

## 2014-08-17 DIAGNOSIS — R631 Polydipsia: Secondary | ICD-10-CM | POA: Diagnosis not present

## 2014-08-17 DIAGNOSIS — E119 Type 2 diabetes mellitus without complications: Secondary | ICD-10-CM

## 2014-08-17 LAB — POCT URINALYSIS DIPSTICK
BILIRUBIN UA: NEGATIVE
GLUCOSE UA: 500
Ketones, UA: 15
Leukocytes, UA: NEGATIVE
NITRITE UA: NEGATIVE
PH UA: 5
RBC UA: NEGATIVE
SPEC GRAV UA: 1.025
UROBILINOGEN UA: 0.2

## 2014-08-17 LAB — POCT UA - MICROSCOPIC ONLY
BACTERIA, U MICROSCOPIC: NEGATIVE
CRYSTALS, UR, HPF, POC: NEGATIVE
Mucus, UA: NEGATIVE
RBC, URINE, MICROSCOPIC: NEGATIVE
Yeast, UA: NEGATIVE

## 2014-08-17 LAB — POCT GLYCOSYLATED HEMOGLOBIN (HGB A1C): Hemoglobin A1C: 9.3

## 2014-08-17 LAB — GLUCOSE, POCT (MANUAL RESULT ENTRY): POC GLUCOSE: 235 mg/dL — AB (ref 70–99)

## 2014-08-17 MED ORDER — METFORMIN HCL 500 MG PO TABS: 500.0000 mg | ORAL_TABLET | Freq: Two times a day (BID) | ORAL | Status: DC

## 2014-08-17 MED ORDER — BLOOD GLUCOSE MONITOR KIT: PACK | Status: DC

## 2014-08-17 NOTE — Patient Instructions (Signed)
Take metformin twice a day with meals. This may cause GI upset initially but should get better. Eat breakfast, a mid-day snack and dinner. Eat protein with each meal. Watch white foods (rice, potatoes, pasta) Eliminate sugary drinks - water only. Check glucose a few times a week and record numbers. You will get a phone call to make appt with eye doctor. Return in 3 months to recheck with me.

## 2014-08-17 NOTE — Progress Notes (Signed)
 Subjective:    Patient ID: Alex Watson, male    DOB: 10/14/1965, 48 y.o.   MRN: [id number]  HPI  This is a 48 year old male who is presenting with polyuria and polydipsia x 2 weeks. States his symptoms are gradually worsening. Last night he woke 4 times to urinate. States he usually sleeps through the night. He is drinking 12 16.9 ounce bottles of water a day. Over the past 2 months he has had some intermittent blurry vision - not worse over the past 2 weeks. He had blood work for his insurance 2 months ago which showed an A1C of 6.7. He does not have a PCP. States he does not exercise and his job is very sedentary. He eats one meal a day - dinner - at home. His wife cooks. He generally has a meat, a starch and a vegetable for dinner. States he drinks water, 2 regular pepsis and one cup of coffee a day. Will also sometimes have lemonade, kool-aid and iced-tea at home. He denies dysuria, abdominal pain, CP, SOB, paresthesias.  Review of Systems  Constitutional: Negative for fever and chills.  Eyes: Positive for visual disturbance.  Respiratory: Negative for shortness of breath.   Cardiovascular: Negative for chest pain.  Gastrointestinal: Negative for nausea, vomiting and abdominal pain.  Endocrine: Positive for polydipsia and polyuria. Negative for polyphagia.  Genitourinary: Positive for frequency. Negative for dysuria.  Skin: Negative for rash.  Neurological: Negative for dizziness and numbness.  Psychiatric/Behavioral: Positive for sleep disturbance.    Patient Active Problem List   Diagnosis Date Noted  . Uncontrolled daytime somnolence 03/07/2013   Prior to Admission medications   Not on File   Allergies  Allergen Reactions  . Morphine     REACTION: difficulty breathing   Patient's social and family history were reviewed.     Objective:   Physical Exam  Constitutional: He is oriented to person, place, and time. He appears well-developed and well-nourished. No distress.    HENT:  Head: Normocephalic and atraumatic.  Right Ear: Hearing normal.  Left Ear: Hearing normal.  Nose: Nose normal.  Eyes: Conjunctivae and lids are normal. Right eye exhibits no discharge. Left eye exhibits no discharge. No scleral icterus.  Cardiovascular: Regular rhythm, normal heart sounds, intact distal pulses and normal pulses.  Tachycardia present.   No murmur heard. Pulmonary/Chest: Effort normal and breath sounds normal. No respiratory distress. He has no wheezes. He has no rhonchi. He has no rales.  Abdominal: Soft. Normal appearance. There is no tenderness. There is no CVA tenderness.  Musculoskeletal: Normal range of motion.  Neurological: He is alert and oriented to person, place, and time. No sensory deficit. Gait normal.  Skin: Skin is warm, dry and intact. No lesion and no rash noted.  Psychiatric: He has a normal mood and affect. His speech is normal and behavior is normal. Thought content normal.   BP 100/64 mmHg  Pulse 111  Temp(Src) 98.6 F (37 C)  Resp 16  Ht 6' 6" (1.981 m)  Wt 216 lb (97.977 kg)  BMI 24.97 kg/m2  SpO2 97%  Results for orders placed or performed in visit on 08/17/14  POCT UA - Microscopic Only  Result Value Ref Range   WBC, Ur, HPF, POC 2-4    RBC, urine, microscopic neg    Bacteria, U Microscopic neg    Mucus, UA neg    Epithelial cells, urine per micros 0-1    Crystals, Ur, HPF, POC neg      Casts, Ur, LPF, POC granular    Yeast, UA neg   POCT urinalysis dipstick  Result Value Ref Range   Color, UA YELLOW    Clarity, UA CLEAR    Glucose, UA 500    Bilirubin, UA NEG    Ketones, UA 15    Spec Grav, UA 1.025    Blood, UA NEG    pH, UA 5.0    Protein, UA TRACE    Urobilinogen, UA 0.2    Nitrite, UA NEG    Leukocytes, UA Negative   POCT glycosylated hemoglobin (Hb A1C)  Result Value Ref Range   Hemoglobin A1C 9.3   POCT glucose (manual entry)  Result Value Ref Range   POC Glucose 235 (A) 70 - 99 mg/dl      Assessment &  Plan:  1. Polyuria 2. Polydipsia 3. Type 2 DM without complication Q4B 9.3 and glucose 235. UA negative except for glucose. Spent a lot of time counseling pt on diet, exercise, disease process and management. He will start taking metformin 500 mg BID. He will check bg 2-3 times a week fasting and post-prandial. Sounds like pt gets a lot of sugar from sugary drinks. Pt will work on cutting these out completely. Referral to ophthalmology as new diabetic and d/t new blurry vision over the past 2 months. He will return in 3 months for follow up.  - POCT UA - Microscopic Only - POCT urinalysis dipstick - Comprehensive metabolic panel - CBC - POCT glycosylated hemoglobin (Hb A1C) - POCT glucose (manual entry) - metFORMIN (GLUCOPHAGE) 500 MG tablet; Take 1 tablet (500 mg total) by mouth 2 (two) times daily with a meal.  Dispense: 60 tablet; Refill: 3 - blood glucose meter kit and supplies KIT; Dispense based on patient and insurance preference. Use up to four times daily as directed. (FOR ICD-9 250.00, 250.01).  Dispense: 1 each; Refill: 0 - Ambulatory referral to Ophthalmology   Benjaman Pott. Drenda Freeze, MHS [facility]  08/18/2014

## 2014-08-18 DIAGNOSIS — E119 Type 2 diabetes mellitus without complications: Secondary | ICD-10-CM | POA: Insufficient documentation

## 2014-08-18 LAB — COMPREHENSIVE METABOLIC PANEL
ALBUMIN: 4.7 g/dL (ref 3.5–5.2)
ALT: 15 U/L (ref 0–53)
AST: 11 U/L (ref 0–37)
Alkaline Phosphatase: 107 U/L (ref 39–117)
BILIRUBIN TOTAL: 0.9 mg/dL (ref 0.2–1.2)
BUN: 14 mg/dL (ref 6–23)
CALCIUM: 9.9 mg/dL (ref 8.4–10.5)
CO2: 25 mEq/L (ref 19–32)
CREATININE: 0.96 mg/dL (ref 0.50–1.35)
Chloride: 97 mEq/L (ref 96–112)
GLUCOSE: 233 mg/dL — AB (ref 70–99)
POTASSIUM: 3.9 meq/L (ref 3.5–5.3)
Sodium: 134 mEq/L — ABNORMAL LOW (ref 135–145)
TOTAL PROTEIN: 7.5 g/dL (ref 6.0–8.3)

## 2014-08-18 LAB — CBC
HCT: 48.8 % (ref 39.0–52.0)
HEMOGLOBIN: 17.3 g/dL — AB (ref 13.0–17.0)
MCH: 33.1 pg (ref 26.0–34.0)
MCHC: 35.5 g/dL (ref 30.0–36.0)
MCV: 93.5 fL (ref 78.0–100.0)
MPV: 10 fL (ref 8.6–12.4)
PLATELETS: 383 10*3/uL (ref 150–400)
RBC: 5.22 MIL/uL (ref 4.22–5.81)
RDW: 13.1 % (ref 11.5–15.5)
WBC: 10.1 10*3/uL (ref 4.0–10.5)

## 2014-08-21 ENCOUNTER — Encounter: Payer: Self-pay | Admitting: Family Medicine

## 2015-01-04 ENCOUNTER — Encounter (HOSPITAL_COMMUNITY): Payer: Self-pay | Admitting: Emergency Medicine

## 2015-01-04 ENCOUNTER — Observation Stay (HOSPITAL_COMMUNITY)
Admission: EM | Admit: 2015-01-04 | Discharge: 2015-01-05 | Disposition: A | Discharge status: Home / Self Care | Payer: Worker's Compensation | Attending: Neurosurgery | Admitting: Neurosurgery

## 2015-01-04 ENCOUNTER — Emergency Department (HOSPITAL_COMMUNITY): Payer: Worker's Compensation

## 2015-01-04 ENCOUNTER — Observation Stay (HOSPITAL_COMMUNITY): Payer: Worker's Compensation

## 2015-01-04 DIAGNOSIS — R52 Pain, unspecified: Secondary | ICD-10-CM

## 2015-01-04 DIAGNOSIS — IMO0001 Reserved for inherently not codable concepts without codable children: Secondary | ICD-10-CM

## 2015-01-04 DIAGNOSIS — E119 Type 2 diabetes mellitus without complications: Secondary | ICD-10-CM | POA: Insufficient documentation

## 2015-01-04 DIAGNOSIS — W11XXXA Fall on and from ladder, initial encounter: Secondary | ICD-10-CM | POA: Diagnosis not present

## 2015-01-04 DIAGNOSIS — F17210 Nicotine dependence, cigarettes, uncomplicated: Secondary | ICD-10-CM | POA: Insufficient documentation

## 2015-01-04 DIAGNOSIS — S06339A Contusion and laceration of cerebrum, unspecified, with loss of consciousness of unspecified duration, initial encounter: Secondary | ICD-10-CM | POA: Diagnosis present

## 2015-01-04 DIAGNOSIS — S066X9A Traumatic subarachnoid hemorrhage with loss of consciousness of unspecified duration, initial encounter: Secondary | ICD-10-CM | POA: Diagnosis not present

## 2015-01-04 DIAGNOSIS — I609 Nontraumatic subarachnoid hemorrhage, unspecified: Secondary | ICD-10-CM

## 2015-01-04 DIAGNOSIS — T148 Other injury of unspecified body region: Secondary | ICD-10-CM | POA: Diagnosis present

## 2015-01-04 HISTORY — DX: Type 2 diabetes mellitus without complications: E11.9

## 2015-01-04 LAB — CBC
HCT: 44.7 % (ref 39.0–52.0)
HEMOGLOBIN: 15.5 g/dL (ref 13.0–17.0)
MCH: 33.1 pg (ref 26.0–34.0)
MCHC: 34.7 g/dL (ref 30.0–36.0)
MCV: 95.5 fL (ref 78.0–100.0)
Platelets: 269 10*3/uL (ref 150–400)
RBC: 4.68 MIL/uL (ref 4.22–5.81)
RDW: 13.8 % (ref 11.5–15.5)
WBC: 15 10*3/uL — ABNORMAL HIGH (ref 4.0–10.5)

## 2015-01-04 LAB — COMPREHENSIVE METABOLIC PANEL
ALT: 19 U/L (ref 17–63)
ANION GAP: 10 (ref 5–15)
AST: 23 U/L (ref 15–41)
Albumin: 4.1 g/dL (ref 3.5–5.0)
Alkaline Phosphatase: 73 U/L (ref 38–126)
BUN: 14 mg/dL (ref 6–20)
CALCIUM: 8.5 mg/dL — AB (ref 8.9–10.3)
CHLORIDE: 104 mmol/L (ref 101–111)
CO2: 22 mmol/L (ref 22–32)
Creatinine, Ser: 0.94 mg/dL (ref 0.61–1.24)
GFR calc non Af Amer: >60 mL/min (ref >60)
Glucose, Bld: 144 mg/dL — ABNORMAL HIGH (ref 65–99)
Potassium: 3.9 mmol/L (ref 3.5–5.1)
SODIUM: 136 mmol/L (ref 135–145)
Total Bilirubin: 0.8 mg/dL (ref 0.3–1.2)
Total Protein: 6.7 g/dL (ref 6.5–8.1)

## 2015-01-04 LAB — MRSA PCR SCREENING: MRSA by PCR: NEGATIVE

## 2015-01-04 LAB — ETHANOL

## 2015-01-04 LAB — CDS SEROLOGY

## 2015-01-04 MED ORDER — HYDROMORPHONE HCL 1 MG/ML IJ SOLN
1.0000 mg | Freq: Once | INTRAMUSCULAR | Status: AC
  Administered 2015-01-04: 1 mg via INTRAVENOUS
  Filled 2015-01-04: qty 1

## 2015-01-04 MED ORDER — HYDROMORPHONE HCL 1 MG/ML IJ SOLN
0.5000 mg | INTRAMUSCULAR | Status: DC | PRN
  Administered 2015-01-04 – 2015-01-05 (×2): 0.5 mg via INTRAVENOUS
  Filled 2015-01-04 (×2): qty 1

## 2015-01-04 MED ORDER — DOCUSATE SODIUM 100 MG PO CAPS
100.0000 mg | ORAL_CAPSULE | Freq: Two times a day (BID) | ORAL | Status: DC
  Administered 2015-01-04: 100 mg via ORAL
  Filled 2015-01-04 (×3): qty 1

## 2015-01-04 MED ORDER — OXYCODONE HCL 5 MG PO TABS
5.0000 mg | ORAL_TABLET | ORAL | Status: DC | PRN
  Administered 2015-01-04 – 2015-01-05 (×3): 5 mg via ORAL
  Filled 2015-01-04 (×3): qty 1

## 2015-01-04 MED ORDER — ONDANSETRON HCL 4 MG PO TABS
4.0000 mg | ORAL_TABLET | Freq: Four times a day (QID) | ORAL | Status: DC | PRN
  Administered 2015-01-05: 4 mg via ORAL
  Filled 2015-01-04: qty 1

## 2015-01-04 MED ORDER — ONDANSETRON HCL 4 MG/2ML IJ SOLN
INTRAMUSCULAR | Status: AC
  Filled 2015-01-04: qty 2

## 2015-01-04 MED ORDER — FENTANYL CITRATE (PF) 100 MCG/2ML IJ SOLN
50.0000 ug | Freq: Once | INTRAMUSCULAR | Status: AC
  Administered 2015-01-04: 50 ug via INTRAVENOUS
  Filled 2015-01-04: qty 2

## 2015-01-04 MED ORDER — NICOTINE 21 MG/24HR TD PT24
21.0000 mg | MEDICATED_PATCH | Freq: Once | TRANSDERMAL | Status: DC
  Administered 2015-01-04: 21 mg via TRANSDERMAL
  Filled 2015-01-04: qty 1

## 2015-01-04 MED ORDER — ACETAMINOPHEN 325 MG PO TABS
650.0000 mg | ORAL_TABLET | Freq: Four times a day (QID) | ORAL | Status: DC | PRN
  Administered 2015-01-05: 650 mg via ORAL
  Filled 2015-01-04: qty 2

## 2015-01-04 MED ORDER — IOHEXOL 300 MG/ML  SOLN
100.0000 mL | Freq: Once | INTRAMUSCULAR | Status: AC | PRN
  Administered 2015-01-04: 100 mL via INTRAVENOUS

## 2015-01-04 MED ORDER — PANTOPRAZOLE SODIUM 40 MG IV SOLR
40.0000 mg | INTRAVENOUS | Status: DC
  Administered 2015-01-04: 40 mg via INTRAVENOUS
  Filled 2015-01-04 (×2): qty 40

## 2015-01-04 MED ORDER — SODIUM CHLORIDE 0.9 % IV BOLUS (SEPSIS)
500.0000 mL | Freq: Once | INTRAVENOUS | Status: AC
  Administered 2015-01-04: 500 mL via INTRAVENOUS

## 2015-01-04 MED ORDER — ACETAMINOPHEN 650 MG RE SUPP: 650.0000 mg | Freq: Four times a day (QID) | RECTAL | Status: DC | PRN

## 2015-01-04 MED ORDER — POTASSIUM CHLORIDE 2 MEQ/ML IV SOLN
INTRAVENOUS | Status: DC
  Administered 2015-01-04: 21:00:00 via INTRAVENOUS
  Filled 2015-01-04 (×3): qty 1000

## 2015-01-04 MED ORDER — ONDANSETRON HCL 4 MG/2ML IJ SOLN: 4.0000 mg | Freq: Four times a day (QID) | INTRAMUSCULAR | Status: DC | PRN

## 2015-01-04 MED ORDER — ONDANSETRON HCL 4 MG/2ML IJ SOLN
4.0000 mg | Freq: Once | INTRAMUSCULAR | Status: AC
  Administered 2015-01-04: 4 mg via INTRAVENOUS
  Filled 2015-01-04: qty 2

## 2015-01-04 MED ORDER — LIDOCAINE HCL (PF) 1 % IJ SOLN
5.0000 mL | Freq: Once | INTRAMUSCULAR | Status: AC
  Administered 2015-01-04: 5 mL via INTRADERMAL
  Filled 2015-01-04: qty 5

## 2015-01-04 MED ORDER — ALUM & MAG HYDROXIDE-SIMETH 200-200-20 MG/5ML PO SUSP
30.0000 mL | Freq: Four times a day (QID) | ORAL | Status: DC | PRN
Start: 2015-01-04 — End: 2015-01-05

## 2015-01-04 NOTE — ED Provider Notes (Signed)
CSN: [id number]     Arrival date & time 01/04/15  1152 History   First MD Initiated Contact with Patient 01/04/15 1239     Chief Complaint  Patient presents with  . Fall     (Consider location/radiation/quality/duration/timing/severity/associated sxs/prior Treatment) Patient is a 49 y.o. male presenting with fall.  Fall This is a new problem. The current episode started 1 to 2 hours ago. The problem occurs constantly. The problem has been gradually worsening. Associated symptoms include headaches. Pertinent negatives include no chest pain, no abdominal pain and no shortness of breath.    Past Medical History  Diagnosis Date  . Allergy   . Uncontrolled daytime somnolence 03/07/2013  . Diabetes mellitus without complication    Past Surgical History  Procedure Laterality Date  . Mandible surgery  1987   Family History  Problem Relation Age of Onset  . Asthma Mother   . Arthritis Mother   . COPD Father   . Heart disease Sister     eisenberger syndrome  . Lupus Sister    Social History  Substance Use Topics  . Smoking status: Current Every Day Smoker -- 24 years    Types: Cigarettes  . Smokeless tobacco: None  . Alcohol Use: Yes     Comment: BEER    Review of Systems  Constitutional: Negative for fever.  HENT: Negative for sore throat.   Eyes: Negative for visual disturbance.  Respiratory: Negative for shortness of breath.   Cardiovascular: Negative for chest pain.  Gastrointestinal: Negative for nausea, vomiting, abdominal pain and constipation.  Genitourinary: Negative for difficulty urinating.  Musculoskeletal: Positive for arthralgias (right shoulder) and neck pain. Negative for back pain and neck stiffness.  Skin: Negative for rash.  Neurological: Positive for seizures (possible convulsion after head trauma), syncope, numbness (only tip of left pinky finger (dislocation)) and headaches. Negative for facial asymmetry and weakness.      Allergies   Morphine  Home Medications   Prior to Admission medications   Medication Sig Start Date End Date Taking? Authorizing Provider  blood glucose meter kit and supplies KIT Dispense based on patient and insurance preference. Use up to four times daily as directed. (FOR ICD-9 250.00, 250.01). 08/17/14  Yes Bennett Scrape V, PA-C  diphenhydramine-acetaminophen (TYLENOL PM) 25-500 MG TABS Take 1 tablet by mouth at bedtime as needed.   Yes Historical Provider, MD  metFORMIN (GLUCOPHAGE) 500 MG tablet Take 1 tablet (500 mg total) by mouth 2 (two) times daily with a meal. 08/17/14  Yes Bennett Scrape V, PA-C   BP 117/67 mmHg  Pulse 85  Temp(Src) 97.9 F (36.6 C) (Oral)  Resp 21  Ht 6' 6"  (1.981 m)  Wt 200 lb (90.719 kg)  BMI 23.12 kg/m2  SpO2 97% Physical Exam  Constitutional: He is oriented to person, place, and time. He appears well-developed and well-nourished. No distress.  HENT:  Head: Normocephalic and atraumatic.  Eyes: Conjunctivae and EOM are normal.  Neck: Normal range of motion.  Cardiovascular: Normal rate, regular rhythm, normal heart sounds and intact distal pulses.  Exam reveals no gallop and no friction rub.   No murmur heard. Pulmonary/Chest: Effort normal and breath sounds normal. No respiratory distress. He has no wheezes. He has no rales.  Abdominal: Soft. He exhibits no distension. There is no tenderness. There is no guarding.  Musculoskeletal: He exhibits no edema.       Right shoulder: He exhibits tenderness and bony tenderness.       Left shoulder:  He exhibits tenderness and bony tenderness.       Cervical back: He exhibits no tenderness and no bony tenderness.       Thoracic back: He exhibits no tenderness and no bony tenderness.       Lumbar back: He exhibits no tenderness and no bony tenderness.       Left hand: He exhibits tenderness (left pinky) and deformity (left pinky DIP). He exhibits normal capillary refill, no laceration and no swelling. Decreased sensation (distal  left pinky) noted.  Neurological: He is alert and oriented to person, place, and time.  Skin: Skin is warm and dry. He is not diaphoretic.  Abrasion l shoulder   Nursing note and vitals reviewed.   ED Course  NERVE BLOCK Date/Time: 01/04/2015 10:59 PM Performed by: Gareth Morgan Authorized by: Gareth Morgan Consent: Verbal consent obtained. Risks and benefits: risks, benefits and alternatives were discussed Required items: required blood products, implants, devices, and special equipment available Time out: Immediately prior to procedure a "time out" was called to verify the correct patient, procedure, equipment, support staff and site/side marked as required. Indications: pain relief and dislocation Body area: upper extremity Laterality: left Preparation: Patient was prepped and draped in the usual sterile fashion. Local anesthetic: lidocaine 1% without epinephrine Anesthetic total: 3 ml Outcome: pain improved Patient tolerance: Patient tolerated the procedure well with no immediate complications  Reduction of dislocation Date/Time: 01/04/2015 11:00 PM Performed by: Gareth Morgan Authorized by: Gareth Morgan Consent: Verbal consent obtained. Risks and benefits: risks, benefits and alternatives were discussed Consent given by: patient Required items: required blood products, implants, devices, and special equipment available Time out: Immediately prior to procedure a "time out" was called to verify the correct patient, procedure, equipment, support staff and site/side marked as required. Preparation: Patient was prepped and draped in the usual sterile fashion. Local anesthesia used: digital block. Patient sedated: no Patient tolerance: Patient tolerated the procedure well with no immediate complications   (including critical care time) Labs Review Labs Reviewed  COMPREHENSIVE METABOLIC PANEL - Abnormal; Notable for the following:    Glucose, Bld 144 (*)     Calcium 8.5 (*)    All other components within normal limits  CBC - Abnormal; Notable for the following:    WBC 15.0 (*)    All other components within normal limits  MRSA PCR SCREENING  CDS SEROLOGY  ETHANOL    Imaging Review Dg Shoulder Right  01/04/2015   CLINICAL DATA:  Pain over both shoulders as well as the left hand.  Pt was 8 ft on top of ladder- fell. Unknown cause of fall. Pt lost consciousness. Pt woke up within approx 1 min. Pt complaining of pain in his head, bilateral shoulder pain, left pink deformity per EMS. Pt alert to self, place- not oriented to time and event.  EXAM: RIGHT SHOULDER - 2+ VIEW  COMPARISON:  None.  FINDINGS: There is no evidence of fracture or dislocation. There is no evidence of arthropathy or other focal bone abnormality. Soft tissues are unremarkable.  IMPRESSION: Negative.   Electronically Signed   By: Lajean Manes M.D.   On: 01/04/2015 14:49   Ct Head Wo Contrast  01/04/2015   CLINICAL DATA:  49 year old male who fell backwards off a ladder approximately 8 feet. Loss of consciousness. Initial encounter.  EXAM: CT HEAD WITHOUT CONTRAST  CT CERVICAL SPINE WITHOUT CONTRAST  TECHNIQUE: Multidetector CT imaging of the head and cervical spine was performed following the standard protocol without  intravenous contrast. Multiplanar CT image reconstructions of the cervical spine were also generated.  COMPARISON:  None.  FINDINGS: CT HEAD FINDINGS  No definite scalp hematoma. Calvarium appears intact. Visualized paranasal sinuses and mastoids are clear. Visualized orbit soft tissues are within normal limits.  Hemorrhagic contusion of the anterior right inferior frontal gyrus (series 2, image 15). There is a 9 mm focus of hemorrhage along the undersurface of the right thalamus near the midbrain. This appears intra-axial. Trace right hemisphere subarachnoid hemorrhage also suspected (series 2, image 22).  No intraventricular hemorrhage. Basilar cisterns are patent. No  midline shift or significant intracranial mass effect at this time. Outside of the anterior right frontal lobe, gray-white matter differentiation is within normal limits. No superimposed acute cortically based infarct identified.  CT CERVICAL SPINE FINDINGS  Preserved cervical lordosis. Visualized skull base is intact. No atlanto-occipital dissociation. C1-C2 alignment is normal, and both of those levels appear intact. Small nonspecific lucent area in the left C2 vertebral body (sagittal image 36). Bone mineralization elsewhere is within normal limits. Cervicothoracic junction alignment is within normal limits. Bilateral posterior element alignment is within normal limits. No acute cervical spine fracture identified. Negative prevertebral/retropharyngeal soft tissues. Grossly intact visualized upper thoracic levels.  There is multilevel cervical disc and endplate degeneration. Up to mild degenerative cervical spinal stenosis is suspected.  Negative lung apices.  Negative noncontrast neck soft tissues.  IMPRESSION: 1. Acute intracranial hemorrhage. Right inferior frontal gyrus hemorrhagic contusion, trace right subarachnoid hemorrhage, and 9 mm probable shear hemorrhage at the right thalamus. 2. No intracranial mass effect at this time. 3. No acute fracture or listhesis identified in the cervical spine. Ligamentous injury is not excluded. 4. Multilevel cervical spine degeneration with mild degenerative spinal stenosis. Critical Value/emergent results were called by telephone at the time of interpretation on 01/04/2015 at 1511 hours to Dr. Gareth Morgan , who verbally acknowledged these results.   Electronically Signed   By: Genevie Ann M.D.   On: 01/04/2015 15:44   Ct Chest W Contrast  01/04/2015   CLINICAL DATA:  Fall from ladder. Loss of consciousness. Bilateral shoulder pain.  EXAM: CT CHEST, ABDOMEN, AND PELVIS WITH CONTRAST  TECHNIQUE: Multidetector CT imaging of the chest, abdomen and pelvis was performed  following the standard protocol during bolus administration of intravenous contrast.  CONTRAST:  157m OMNIPAQUE IOHEXOL 300 MG/ML  SOLN  COMPARISON:  None.  FINDINGS: CT CHEST FINDINGS  Mediastinum: The heart size appears normal. No pericardial effusion identified. There is no mediastinal or hilar adenopathy identified. The trachea appears patent and is midline. Normal appearance of the esophagus.  Lungs/Pleura: No pleural effusion. The lungs appear clear. No pneumothorax or evidence of pulmonary contusion. Perifissural nodule is identified within the right midlung measuring 11 mm. No solid parenchymal nodules identified.  Musculoskeletal: No acute bone abnormality identified.  CT ABDOMEN AND PELVIS FINDINGS  Hepatobiliary: No suspicious liver abnormality identified. The gallbladder appears normal. There is no biliary dilatation.  Pancreas: Normal appearance of the pancreas.  Spleen: Negative  Adrenals/Urinary Tract: Normal appearance of the adrenal glands. The kidneys are unremarkable. The urinary bladder appears normal.  Stomach/Bowel: The stomach is within normal limits. The small bowel loops have a normal course and caliber. No obstruction. Normal appearance of the colon. The appendix is visualized and appears normal.  Vascular/Lymphatic: Normal appearance of the abdominal aorta. No enlarged retroperitoneal or mesenteric adenopathy. No enlarged pelvic or inguinal lymph nodes.  Reproductive: Prostate gland and seminal vesicles appear normal.  Other:  There is no ascites or focal fluid collections within the abdomen or pelvis.  Musculoskeletal: The visualized osseous structures appear intact. No acute bone abnormality noted.  IMPRESSION: 1. No acute findings identified within the chest, abdomen or pelvis.   Electronically Signed   By: Kerby Moors M.D.   On: 01/04/2015 15:53   Ct Cervical Spine Wo Contrast  01/04/2015   CLINICAL DATA:  49 year old male who fell backwards off a ladder approximately 8 feet.  Loss of consciousness. Initial encounter.  EXAM: CT HEAD WITHOUT CONTRAST  CT CERVICAL SPINE WITHOUT CONTRAST  TECHNIQUE: Multidetector CT imaging of the head and cervical spine was performed following the standard protocol without intravenous contrast. Multiplanar CT image reconstructions of the cervical spine were also generated.  COMPARISON:  None.  FINDINGS: CT HEAD FINDINGS  No definite scalp hematoma. Calvarium appears intact. Visualized paranasal sinuses and mastoids are clear. Visualized orbit soft tissues are within normal limits.  Hemorrhagic contusion of the anterior right inferior frontal gyrus (series 2, image 15). There is a 9 mm focus of hemorrhage along the undersurface of the right thalamus near the midbrain. This appears intra-axial. Trace right hemisphere subarachnoid hemorrhage also suspected (series 2, image 22).  No intraventricular hemorrhage. Basilar cisterns are patent. No midline shift or significant intracranial mass effect at this time. Outside of the anterior right frontal lobe, gray-white matter differentiation is within normal limits. No superimposed acute cortically based infarct identified.  CT CERVICAL SPINE FINDINGS  Preserved cervical lordosis. Visualized skull base is intact. No atlanto-occipital dissociation. C1-C2 alignment is normal, and both of those levels appear intact. Small nonspecific lucent area in the left C2 vertebral body (sagittal image 36). Bone mineralization elsewhere is within normal limits. Cervicothoracic junction alignment is within normal limits. Bilateral posterior element alignment is within normal limits. No acute cervical spine fracture identified. Negative prevertebral/retropharyngeal soft tissues. Grossly intact visualized upper thoracic levels.  There is multilevel cervical disc and endplate degeneration. Up to mild degenerative cervical spinal stenosis is suspected.  Negative lung apices.  Negative noncontrast neck soft tissues.  IMPRESSION: 1. Acute  intracranial hemorrhage. Right inferior frontal gyrus hemorrhagic contusion, trace right subarachnoid hemorrhage, and 9 mm probable shear hemorrhage at the right thalamus. 2. No intracranial mass effect at this time. 3. No acute fracture or listhesis identified in the cervical spine. Ligamentous injury is not excluded. 4. Multilevel cervical spine degeneration with mild degenerative spinal stenosis. Critical Value/emergent results were called by telephone at the time of interpretation on 01/04/2015 at 1511 hours to Dr. Gareth Morgan , who verbally acknowledged these results.   Electronically Signed   By: Genevie Ann M.D.   On: 01/04/2015 15:44   Ct Abdomen Pelvis W Contrast  01/04/2015   CLINICAL DATA:  Fall from ladder. Loss of consciousness. Bilateral shoulder pain.  EXAM: CT CHEST, ABDOMEN, AND PELVIS WITH CONTRAST  TECHNIQUE: Multidetector CT imaging of the chest, abdomen and pelvis was performed following the standard protocol during bolus administration of intravenous contrast.  CONTRAST:  162m OMNIPAQUE IOHEXOL 300 MG/ML  SOLN  COMPARISON:  None.  FINDINGS: CT CHEST FINDINGS  Mediastinum: The heart size appears normal. No pericardial effusion identified. There is no mediastinal or hilar adenopathy identified. The trachea appears patent and is midline. Normal appearance of the esophagus.  Lungs/Pleura: No pleural effusion. The lungs appear clear. No pneumothorax or evidence of pulmonary contusion. Perifissural nodule is identified within the right midlung measuring 11 mm. No solid parenchymal nodules identified.  Musculoskeletal: No acute bone  abnormality identified.  CT ABDOMEN AND PELVIS FINDINGS  Hepatobiliary: No suspicious liver abnormality identified. The gallbladder appears normal. There is no biliary dilatation.  Pancreas: Normal appearance of the pancreas.  Spleen: Negative  Adrenals/Urinary Tract: Normal appearance of the adrenal glands. The kidneys are unremarkable. The urinary bladder appears  normal.  Stomach/Bowel: The stomach is within normal limits. The small bowel loops have a normal course and caliber. No obstruction. Normal appearance of the colon. The appendix is visualized and appears normal.  Vascular/Lymphatic: Normal appearance of the abdominal aorta. No enlarged retroperitoneal or mesenteric adenopathy. No enlarged pelvic or inguinal lymph nodes.  Reproductive: Prostate gland and seminal vesicles appear normal.  Other: There is no ascites or focal fluid collections within the abdomen or pelvis.  Musculoskeletal: The visualized osseous structures appear intact. No acute bone abnormality noted.  IMPRESSION: 1. No acute findings identified within the chest, abdomen or pelvis.   Electronically Signed   By: Kerby Moors M.D.   On: 01/04/2015 15:53   Dg Pelvis Portable  01/04/2015   CLINICAL DATA:  Fall from 8 foot ladder with pelvic pain, initial encounter  EXAM: PORTABLE PELVIS 1-2 VIEWS  COMPARISON:  None.  FINDINGS: There is no evidence of pelvic fracture or diastasis. No pelvic bone lesions are seen.  IMPRESSION: No acute abnormality noted.   Electronically Signed   By: Inez Catalina M.D.   On: 01/04/2015 13:26   Dg Chest Portable 1 View  01/04/2015   CLINICAL DATA:  Fall from 8 foot ladder with shoulder pain and chest pain, initial encounter  EXAM: PORTABLE CHEST - 1 VIEW  COMPARISON:  None.  FINDINGS: The heart size and mediastinal contours are within normal limits. Both lungs are clear. The visualized skeletal structures are unremarkable.  IMPRESSION: No acute abnormality noted.   Electronically Signed   By: Inez Catalina M.D.   On: 01/04/2015 13:26   Dg Shoulder Left  01/04/2015   CLINICAL DATA:  Fall from 8 feet.  EXAM: LEFT SHOULDER - 2+ VIEW  COMPARISON:  None.  FINDINGS: Two views of the left shoulder are provided. Osseous alignment is normal. No fracture line or displaced fracture fragment. Left humeral head is well positioned relative to the glenoid fossa. Acromioclavicular  joint space appears well aligned. Adjacent left upper ribs appear intact and well aligned. Soft tissues about the left shoulder are unremarkable.  IMPRESSION: Normal plain film examination of the left shoulder. No fracture or dislocation seen.   Electronically Signed   By: Franki Cabot M.D.   On: 01/04/2015 14:51   Dg Hand Complete Left  01/04/2015   CLINICAL DATA:  Pain over both shoulders and over the left hand.  Patient fell 8 feet from a ladder.  Loss of consciousness.  EXAM: LEFT HAND - COMPLETE 3+ VIEW  COMPARISON:  None.  FINDINGS: No fracture.  There is posterior dislocation of the distal phalanx of the fifth finger at the DIP joint. Remaining joints are normally spaced and aligned.  There is a lucent cortical bone lesion in the distal fourth metacarpal consistent with a fibrous cortical lesion. This is incidental.  Soft tissues are unremarkable.  IMPRESSION: No fracture.  Dislocated DIP joint of the left fifth finger.   Electronically Signed   By: Lajean Manes M.D.   On: 01/04/2015 14:56   Dg Finger Little Left  01/04/2015   CLINICAL DATA:  Dislocation of fifth digit. Fell from step ladder at work.  EXAM: LEFT LITTLE FINGER 2+V  COMPARISON:  Earlier today at 1420 hours.  FINDINGS: Interval relocation of the previously described dislocation involving the distal interphalangeal joint. No complicating fracture identified.  IMPRESSION: Interval relocation of the distal interphalangeal joint of the fifth digit.   Electronically Signed   By: Abigail Miyamoto M.D.   On: 01/04/2015 18:01   I have personally reviewed and evaluated these images and lab results as part of my medical decision-making.   EKG Interpretation   Date/Time:  Friday January 04 2015 12:07:07 EDT Ventricular Rate:  72 PR Interval:  134 QRS Duration: 103 QT Interval:  393 QTC Calculation: 430 R Axis:   60 Text Interpretation:  Sinus rhythm No previous ECGs available Confirmed by  [facility] MD, Neave Lenger ([id number]) on 01/04/2015 11:01:18  PM      MDM   Final diagnoses:  Dislocation  SAH Intraparenchymal contusion Fall from ladder  49yo male with no significant medical problems presents with concern for fall 30f off of ladder onto concrete with LOC for several minutes, headache, bilateral shoulder pain, left pinky pain.  CT head/csp/c/a/p obtained and showed SAH and IPH. Not on anticoagulation.  Pt otherwise alert, NV intact with exception of distal pinky which was dislocated.  [facility] consulted. Trauma scans do not show other injuries.  XR shoulders WNL and pinky shows dislocation. Digital block and reduction performed with realignment. Numbness to tip of finger, however good cap refill. Placed in finger brace which he should maintain for 3 wk and follow up with PCP or hand physician. Pt admitted for observation to [facility] for continued care.      EGareth Morgan MD 01/04/15 2306

## 2015-01-04 NOTE — ED Notes (Signed)
Pt alert and oriented to time, place, self. Unaware of event that caused him to fall and fall itself. Pt complaining of left pinky pain, right leg pain and right shoulder pain at this time

## 2015-01-04 NOTE — Progress Notes (Addendum)
eLink Physician-Brief Progress Note Patient Name: Alex Watson DOB: Nov 23, 1965 MRN: [id number]   Date of Service  01/04/2015  HPI/Events of Note  The patient is a 49 year old white male who fell off an 8 foot ladder today. There was a brief loss of consciousness. The patient remembers the latter being unsteady and then woke up in the ambulance. He was brought to [facility] emergency room where a head CT was obtained and demonstrated a small right frontal contusion and a small right shear type hemorrhage. Assessment - Traumatic brain injury, concussion, subarachnoid hemorrhage, cerebral contusion. Needs Stress Ulcer prophylaxis.  Management per Neurosurgery.   eICU Interventions  Continue current management. Will order Protonix IV.      Intervention Category Evaluation Type: New Patient Evaluation  Lenell Antu 01/04/2015, 8:49 PM

## 2015-01-04 NOTE — ED Notes (Signed)
Pt was 8 ft on top of ladder- fell. Unknown cause of fall. Pt lost consciousness. Pt woke up within approx 1 min. Pt complaining of pain in his head, bilateral shoulder pain, left pink deformity per EMS. Pt alert to self, place- not oriented to time and event. Pt having repetitive questions. BP 132/75, CBG 141, SR HR 82. Denies SOB.

## 2015-01-04 NOTE — H&P (Signed)
Subjective: The patient is a 49 year old white male who fell off an 8 foot ladder today. There was a brief loss of consciousness. The patient remembers the latter being unsteady and then woke up in the ambulance. He was brought to [facility] emergency room where a head CT was obtained and demonstrated a small right frontal contusion and a small right shear type hemorrhage. A neurosurgical consultation was requested.  Presently the patient is accompanied by his wife and mother. He admits to a history of migraine headaches and chronic nausea. He complains of pain in his bilateral shoulders and clavicles. He denies neck pain, back pain, numbness, tingling, weakness, seizures, etc.  Past Medical History  Diagnosis Date  . Allergy   . Uncontrolled daytime somnolence 03/07/2013  . Diabetes mellitus without complication     Past Surgical History  Procedure Laterality Date  . Mandible surgery  1987    Allergies  Allergen Reactions  . Morphine     REACTION: difficulty breathing    Social History  Substance Use Topics  . Smoking status: Current Every Day Smoker -- 24 years    Types: Cigarettes  . Smokeless tobacco: Not on file  . Alcohol Use: Yes     Comment: BEER    Family History  Problem Relation Age of Onset  . Asthma Mother   . Arthritis Mother   . COPD Father   . Heart disease Sister     eisenberger syndrome  . Lupus Sister    Prior to Admission medications   Medication Sig Start Date End Date Taking? Authorizing Provider  blood glucose meter kit and supplies KIT Dispense based on patient and insurance preference. Use up to four times daily as directed. (FOR ICD-9 250.00, 250.01). 08/17/14  Yes Bennett Scrape V, PA-C  diphenhydramine-acetaminophen (TYLENOL PM) 25-500 MG TABS Take 1 tablet by mouth at bedtime as needed.   Yes Historical Provider, MD  metFORMIN (GLUCOPHAGE) 500 MG tablet Take 1 tablet (500 mg total) by mouth 2 (two) times daily with a meal. 08/17/14  Yes Bennett Scrape V,  PA-C     Review of Systems  Positive ROS: As above  All other systems have been reviewed and were otherwise negative with the exception of those mentioned in the HPI and as above.  Objective: Vital signs in last 24 hours: Temp:  [97.9 F (36.6 C)] 97.9 F (36.6 C) (08/26 1157) Pulse Rate:  [63-76] 63 (08/26 1730) Resp:  [13-25] 19 (08/26 1730) BP: (108-138)/(61-83) 122/68 mmHg (08/26 1730) SpO2:  [95 %-98 %] 95 % (08/26 1730) Weight:  [90.719 kg (200 lb)] 90.719 kg (200 lb) (08/26 1157)  Physical exam:  General: An alert and pleasant 50 year old white male in no apparent distress.  HEENT: Normocephalic, atraumatic, his pupils are equal round and reactive to light. Extraocular muscles are intact. There is no evidence of battle signs, raccoon's eyes, CSF otorrhea or rhinorrhea.  Neck: Supple without masses or deformities. He has a mildly decreased range of motion. Spurling's testing is negative.  Thorax: Symmetric  Abdomen: Soft  Extremities: The patient has a splint on his left fifth digit / Pinky. His extremities are otherwise unremarkable.  Back exam: Unremarkable  Neurologic exam: The patient is alert and oriented 3. Glasgow Coma Scale 15. Cranial nerves II through XII were examined bilaterally and grossly normal. Vision and hearing are grossly normal bilaterally. The patient's motor strength is 5 over 5 in his bilateral deltoid, biceps, triceps, hand grip, quadriceps, gastrocnemius, dorsiflexors. Cerebellar function is  intact to rapid alternating movement of the upper extremities bilaterally. Sensory function is intact to light touch and sensation all tested dermatomes bilaterally.  I have reviewed the patient's head CT performed at [facility] today. He has a small right subarachnoid hemorrhage, right frontal contusion, and right shear hemorrhage. There is no significant mass effect.  I've also reviewed the patient's cervical CT: It demonstrates some mild  degenerative changes. Nothing acute.   Data Review Lab Results  Component Value Date   WBC 15.0* 01/04/2015   HGB 15.5 01/04/2015   HCT 44.7 01/04/2015   MCV 95.5 01/04/2015   PLT 269 01/04/2015   Lab Results  Component Value Date   NA 136 01/04/2015   K 3.9 01/04/2015   CL 104 01/04/2015   CO2 22 01/04/2015   BUN 14 01/04/2015   CREATININE 0.94 01/04/2015   GLUCOSE 144* 01/04/2015   No results found for: INR, PROTIME  Assessment/Plan: Traumatic brain injury, concussion, subarachnoid hemorrhage, cerebral contusion: I have discussed the situation with the patient and his family. The patient has been admitted to the ICU for observation. We will plan to repeat his CAT scan tomorrow. If it looks good we will label to discharge him to home. I have answered all their questions.   Anaid Haney D 01/04/2015 7:59 PM

## 2015-01-04 NOTE — ED Notes (Signed)
Removed C collar per verbal order from MD 

## 2015-01-05 ENCOUNTER — Observation Stay (HOSPITAL_COMMUNITY): Payer: Worker's Compensation

## 2015-01-05 LAB — GLUCOSE, CAPILLARY
Glucose-Capillary: 175 mg/dL — ABNORMAL HIGH (ref 65–99)
Glucose-Capillary: 197 mg/dL — ABNORMAL HIGH (ref 65–99)

## 2015-01-05 MED ORDER — OXYCODONE HCL 5 MG PO TABS: 5.0000 mg | ORAL_TABLET | ORAL | Status: DC | PRN

## 2015-01-05 NOTE — Discharge Summary (Signed)
Physician Discharge Summary  Patient ID: Alex Watson MRN: [id number] DOB/AGE: 1965/05/21 49 y.o.  Admit date: 01/04/2015 Discharge date: 01/05/2015  Admission Diagnoses: Traumatic brain injury, cerebral contusion, subarachnoid hemorrhage,  Discharge Diagnoses: The same Active Problems:   Subarachnoid bleed   Cerebral contusion and laceration with loss of consciousness   Discharged Condition: good  Hospital Course: The patient was admitted with a diagnosis of a traumatic brain injury, cerebral contusion, subarachnoid hemorrhage, etc. He was observed in the ICU. His clinical course was stable. A follow-up CT on 01/05/2015 demonstrated no significant change in the small hemorrhages. The patient requested discharge to home. He was given oral and written discharge instructions. All his questions were answered.  Consults: None Significant Diagnostic Studies: Head CT's Treatments: Observation Discharge Exam: Blood pressure 117/58, pulse 69, temperature 98.3 F (36.8 C), temperature source Oral, resp. rate 19, height 6' 6"  (1.981 m), weight 90.719 kg (200 lb), SpO2 98 %. The patient is alert and oriented 3. Glasgow Coma Scale 15. His speech is normal. His pupils are equal. He is moving all 4 extremities well.  Disposition: Home  Discharge Instructions    Call MD for:  difficulty breathing, headache or visual disturbances    Complete by:  As directed      Call MD for:  extreme fatigue    Complete by:  As directed      Call MD for:  hives    Complete by:  As directed      Call MD for:  persistant dizziness or light-headedness    Complete by:  As directed      Call MD for:  persistant nausea and vomiting    Complete by:  As directed      Call MD for:  redness, tenderness, or signs of infection (pain, swelling, redness, odor or green/yellow discharge around incision site)    Complete by:  As directed      Call MD for:  severe uncontrolled pain    Complete by:  As directed      Call  MD for:  temperature >100.4    Complete by:  As directed      Diet - low sodium heart healthy    Complete by:  As directed      Discharge instructions    Complete by:  As directed   Call [phone] for a followup appointment. Take a stool softener while you are using pain medications.     Driving Restrictions    Complete by:  As directed   Do not drive for 2 weeks.     Increase activity slowly    Complete by:  As directed      Lifting restrictions    Complete by:  As directed   Do not lift more than 5 pounds. No excessive bending or twisting.     May shower / Bathe    Complete by:  As directed   He may shower after the pain she is removed 3 days after surgery. Leave the incision alone.     No wound care    Complete by:  As directed             Medication List    TAKE these medications        blood glucose meter kit and supplies Kit  Dispense based on patient and insurance preference. Use up to four times daily as directed. (FOR ICD-9 250.00, 250.01).     diphenhydramine-acetaminophen 25-500 MG Tabs  Commonly known  as:  TYLENOL PM  Take 1 tablet by mouth at bedtime as needed.     metFORMIN 500 MG tablet  Commonly known as:  GLUCOPHAGE  Take 1 tablet (500 mg total) by mouth 2 (two) times daily with a meal.     oxyCODONE 5 MG immediate release tablet  Commonly known as:  Oxy IR/ROXICODONE  Take 1 tablet (5 mg total) by mouth every 4 (four) hours as needed for moderate pain.         SignedOphelia Charter 01/05/2015, 6:12 AM

## 2015-01-05 NOTE — Progress Notes (Signed)
Pt given d/c instructions and prescription by previous RN and verbalized understanding to current RN. Pt also instructed to f/u with Dr. Lovell Sheehan in two weeks and to call office to make appointment. Pt also instructed when to seek emergency help and TBI form and finger dislocation form given and reviewed with pt and wife. Pt tolerated ambulating around unit. Pt IV removed and pt dressed. Pt reported pain controlled before d/c home and ate breakfast. Copies of d/c instructions placed in chart. Pt wheeled to car to d/c home with wife.

## 2015-01-08 ENCOUNTER — Emergency Department (HOSPITAL_COMMUNITY)
Admission: EM | Admit: 2015-01-08 | Discharge: 2015-01-08 | Disposition: A | Discharge status: Home / Self Care | Payer: Worker's Compensation | Attending: Emergency Medicine | Admitting: Emergency Medicine

## 2015-01-08 ENCOUNTER — Emergency Department (HOSPITAL_COMMUNITY): Payer: Worker's Compensation

## 2015-01-08 ENCOUNTER — Encounter (HOSPITAL_COMMUNITY): Payer: Self-pay | Admitting: Emergency Medicine

## 2015-01-08 DIAGNOSIS — S069X0D Unspecified intracranial injury without loss of consciousness, subsequent encounter: Secondary | ICD-10-CM

## 2015-01-08 DIAGNOSIS — S4992XD Unspecified injury of left shoulder and upper arm, subsequent encounter: Secondary | ICD-10-CM | POA: Diagnosis not present

## 2015-01-08 DIAGNOSIS — S06300D Unspecified focal traumatic brain injury without loss of consciousness, subsequent encounter: Secondary | ICD-10-CM | POA: Insufficient documentation

## 2015-01-08 DIAGNOSIS — S199XXD Unspecified injury of neck, subsequent encounter: Secondary | ICD-10-CM | POA: Insufficient documentation

## 2015-01-08 DIAGNOSIS — Z720 Tobacco use: Secondary | ICD-10-CM | POA: Diagnosis not present

## 2015-01-08 DIAGNOSIS — W1839XD Other fall on same level, subsequent encounter: Secondary | ICD-10-CM | POA: Diagnosis not present

## 2015-01-08 DIAGNOSIS — M542 Cervicalgia: Secondary | ICD-10-CM

## 2015-01-08 DIAGNOSIS — Z79899 Other long term (current) drug therapy: Secondary | ICD-10-CM | POA: Insufficient documentation

## 2015-01-08 DIAGNOSIS — M25512 Pain in left shoulder: Secondary | ICD-10-CM

## 2015-01-08 DIAGNOSIS — E119 Type 2 diabetes mellitus without complications: Secondary | ICD-10-CM | POA: Insufficient documentation

## 2015-01-08 DIAGNOSIS — S0990XD Unspecified injury of head, subsequent encounter: Secondary | ICD-10-CM | POA: Diagnosis present

## 2015-01-08 LAB — CBC WITH DIFFERENTIAL/PLATELET
Basophils Absolute: 0 10*3/uL (ref 0.0–0.1)
Basophils Relative: 0 % (ref 0–1)
EOS PCT: 2 % (ref 0–5)
Eosinophils Absolute: 0.2 10*3/uL (ref 0.0–0.7)
HEMATOCRIT: 45.1 % (ref 39.0–52.0)
HEMOGLOBIN: 15.4 g/dL (ref 13.0–17.0)
LYMPHS ABS: 2.5 10*3/uL (ref 0.7–4.0)
LYMPHS PCT: 29 % (ref 12–46)
MCH: 32.6 pg (ref 26.0–34.0)
MCHC: 34.1 g/dL (ref 30.0–36.0)
MCV: 95.6 fL (ref 78.0–100.0)
Monocytes Absolute: 0.6 10*3/uL (ref 0.1–1.0)
Monocytes Relative: 6 % (ref 3–12)
NEUTROS ABS: 5.6 10*3/uL (ref 1.7–7.7)
Neutrophils Relative %: 63 % (ref 43–77)
PLATELETS: 308 10*3/uL (ref 150–400)
RBC: 4.72 MIL/uL (ref 4.22–5.81)
RDW: 13.4 % (ref 11.5–15.5)
WBC: 8.8 10*3/uL (ref 4.0–10.5)

## 2015-01-08 LAB — BASIC METABOLIC PANEL
ANION GAP: 8 (ref 5–15)
BUN: 6 mg/dL (ref 6–20)
CHLORIDE: 104 mmol/L (ref 101–111)
CO2: 27 mmol/L (ref 22–32)
Calcium: 9.2 mg/dL (ref 8.9–10.3)
Creatinine, Ser: 0.9 mg/dL (ref 0.61–1.24)
GFR calc Af Amer: >60 mL/min (ref >60)
GFR calc non Af Amer: >60 mL/min (ref >60)
GLUCOSE: 127 mg/dL — AB (ref 65–99)
POTASSIUM: 4.2 mmol/L (ref 3.5–5.1)
Sodium: 139 mmol/L (ref 135–145)

## 2015-01-08 MED ORDER — ACETAMINOPHEN 325 MG PO TABS
650.0000 mg | ORAL_TABLET | Freq: Once | ORAL | Status: AC
  Administered 2015-01-08: 650 mg via ORAL
  Filled 2015-01-08: qty 2

## 2015-01-08 MED ORDER — AMITRIPTYLINE HCL 25 MG PO TABS: 25.0000 mg | ORAL_TABLET | Freq: Every day | ORAL | Status: DC

## 2015-01-08 NOTE — ED Notes (Signed)
Patient states fell off ladder last week and came in as a trauma.  Patient states he was admitted to trauma icu and was released on Saturday.   Patient states that he has been having trouble managing pain to shoulders, head and hip.   Patient states he has been increasingly agitated, which is new for him.   Patient states he was diagnosed with 2 small brain bleeds last week.   Patient states his R hand sometimes feels like he has glass in it.

## 2015-01-08 NOTE — ED Provider Notes (Signed)
CSN: [id number]     Arrival date & time 01/08/15  1340 History   First MD Initiated Contact with Patient 01/08/15 1640     Chief Complaint  Patient presents with  . Fall     (Consider location/radiation/quality/duration/timing/severity/associated sxs/prior Treatment) Patient is a 49 y.o. male presenting with fall. The history is provided by the patient. No language interpreter was used.  Fall Associated symptoms include headaches. Pertinent negatives include no chills, fever or weakness.  Alex Watson is a 49 year old male with a history of diabetes who presents for ongoing headache for the past 4 days since his fall from a ladder which resulted in TBI, SAH, and intraparenchymal right frontal lobe contusion. He is having trouble sleeping at night. He states the headaches have been relieved by Tylenol but that he has to take them often. He is also complaining of left shoulder pain and right arm tingling. His wife also states he is having mood swings. He denies any vision changes, photophobia, fever, chills, chest pain, abdominal pain, nausea, vomiting.   Past Medical History  Diagnosis Date  . Allergy   . Uncontrolled daytime somnolence 03/07/2013  . Diabetes mellitus without complication    Past Surgical History  Procedure Laterality Date  . Mandible surgery  1987   Family History  Problem Relation Age of Onset  . Asthma Mother   . Arthritis Mother   . COPD Father   . Heart disease Sister     eisenberger syndrome  . Lupus Sister    Social History  Substance Use Topics  . Smoking status: Current Every Day Smoker -- 24 years    Types: Cigarettes  . Smokeless tobacco: None  . Alcohol Use: Yes     Comment: BEER    Review of Systems  Constitutional: Negative for fever and chills.  Neurological: Positive for headaches. Negative for dizziness, seizures, syncope, facial asymmetry, speech difficulty and weakness.  All other systems reviewed and are negative.     Allergies   Morphine  Home Medications   Prior to Admission medications   Medication Sig Start Date End Date Taking? Authorizing Provider  amitriptyline (ELAVIL) 25 MG tablet Take 1 tablet (25 mg total) by mouth at bedtime. 01/08/15   Alex Hinzman Patel-Mills, PA-C  blood glucose meter kit and supplies KIT Dispense based on patient and insurance preference. Use up to four times daily as directed. (FOR ICD-9 250.00, 250.01). 08/17/14   Alex Slocumb, PA-C  diphenhydramine-acetaminophen (TYLENOL PM) 25-500 MG TABS Take 1 tablet by mouth at bedtime as needed.    Historical Provider, MD  metFORMIN (GLUCOPHAGE) 500 MG tablet Take 1 tablet (500 mg total) by mouth 2 (two) times daily with a meal. 08/17/14   Alex Slocumb, PA-C  oxyCODONE (OXY IR/ROXICODONE) 5 MG immediate release tablet Take 1 tablet (5 mg total) by mouth every 4 (four) hours as needed for moderate pain. 01/05/15   Alex Pies, MD   BP 112/68 mmHg  Pulse 62  Temp(Src) 98.6 F (37 C) (Oral)  Resp 12  Ht _0  (1.981 m)  Wt 200 lb (90.719 kg)  BMI 23.12 kg/m2  SpO2 100% Physical Exam  Constitutional: He is oriented to person, place, and time. He appears well-developed and well-nourished.  HENT:  Head: Normocephalic and atraumatic.  Eyes: Conjunctivae are normal.  Neck: Normal range of motion. Neck supple. No Brudzinski's sign and no Kernig's sign noted.  No midline cervical tenderness to palpation.  Cardiovascular: Normal rate, regular rhythm and normal heart  sounds.   Pulmonary/Chest: Effort normal and breath sounds normal. No respiratory distress. He has no wheezes.  Abdominal: Soft. There is no tenderness.  Musculoskeletal: Normal range of motion.  Full ROM of the left shoulder and elbow without difficulty. TTP of the posterior left shoulder without deformity. There is no ecchymosis or signs of re injury or trauma.  Neurological: He is alert and oriented to person, place, and time.  Skin: Skin is warm and dry.  Psychiatric: He has a normal  mood and affect. His behavior is normal.  Nursing note and vitals reviewed.   ED Course  Procedures (including critical care time) Labs Review Labs Reviewed  BASIC METABOLIC PANEL - Abnormal; Notable for the following:    Glucose, Bld 127 (*)    All other components within normal limits  CBC WITH DIFFERENTIAL/PLATELET    Imaging Review Ct Head Wo Contrast  01/08/2015   CLINICAL DATA:  Fall from ladder 4 days ago. Now with right arm numbness and headache. Known intracranial hemorrhage. Subsequent encounter.  EXAM: CT HEAD WITHOUT CONTRAST  TECHNIQUE: Contiguous axial images were obtained from the base of the skull through the vertex without intravenous contrast.  COMPARISON:  01/04/2015 and 01/05/2015.  FINDINGS: Hemorrhagic contusion inferiorly in the right frontal lobe is similar to the prior study, although there is increased surrounding vasogenic edema. 9 mm focus of hemorrhage in the right thalamus is stable. No new areas of intracranial hemorrhage identified. The gray-white differentiation is slightly less distinct overall, suspicious for mild cerebral edema. However, there is no effacement of the basilar cisterns or ventricles. There is no midline shift or extra-axial fluid collection.  The mastoid air cells, middle ears and visualized paranasal sinuses remain clear. The calvarium is intact.  IMPRESSION: Increased edema surrounding the hemorrhagic contusion in the inferior right frontal lobe. Possible mild increase in generalized cerebral edema without basilar cistern effacement or hydrocephalus. Stable right thalamic hematoma.   Electronically Signed   By: Alex Watson M.D.   On: 01/08/2015 15:22   I have personally reviewed and evaluated these images and lab results as part of my medical decision-making.   EKG Interpretation None      MDM   Final diagnoses:  TBI (traumatic brain injury), without loss of consciousness, subsequent encounter  Left shoulder pain  Neck pain on  right side  Patient presents for continued headaches after TBI with right frontal lobe contusion that occurred 4 days ago. CT head showed some increased edema surrounding the hemorrhagic contusion in the right inferior lobe. I Spoke to Alex Watson, with neurosurgery, who recommended amitriptyline 25 mg to help the patient sleep at night. He can continue taking Tylenol for pain. He stated that the edema and pain were expected after this type of head trauma.  He has a follow-up appointment with Alex Watson, with neurosurgery, in 10 days. He was given orthopedic referral for left shoulder pain since he does not have a pcp except the urgent care he sees.  He stated that he did not want pcp referral since he travels for work.  I discussed return precautions with the patient and he verbally agrees with the plan.    Alex Glazier, PA-C 01/09/15 1935  Alex Biles, MD 01/11/15 1745

## 2015-01-21 ENCOUNTER — Ambulatory Visit (INDEPENDENT_AMBULATORY_CARE_PROVIDER_SITE_OTHER): Payer: BLUE CROSS/BLUE SHIELD | Admitting: Physician Assistant

## 2015-01-21 VITALS — BP 110/70 | HR 74 | Temp 97.6°F | Resp 18 | Ht 76.0 in | Wt 193.5 lb

## 2015-01-21 DIAGNOSIS — E118 Type 2 diabetes mellitus with unspecified complications: Secondary | ICD-10-CM

## 2015-01-21 DIAGNOSIS — E119 Type 2 diabetes mellitus without complications: Secondary | ICD-10-CM | POA: Diagnosis not present

## 2015-01-21 LAB — POCT GLYCOSYLATED HEMOGLOBIN (HGB A1C): Hemoglobin A1C: 7.5

## 2015-01-21 LAB — GLUCOSE, POCT (MANUAL RESULT ENTRY): POC GLUCOSE: 221 mg/dL — AB (ref 70–99)

## 2015-01-21 MED ORDER — LANCETS 30G MISC: Status: DC

## 2015-01-21 MED ORDER — GLUCOSE BLOOD VI STRP: ORAL_STRIP | Status: DC

## 2015-01-21 MED ORDER — METFORMIN HCL 500 MG PO TABS: ORAL_TABLET | ORAL | Status: DC

## 2015-01-21 NOTE — Progress Notes (Signed)
[facility] [address], [place] [address] [phone]  Date:  01/21/2015   Name:  Alex Watson   DOB:  [birth year]   MRN:  [id number]  PCP:  No PCP Per Patient    History of Present Illness:  Alex Watson is a 49 y.o. male patient who presents to [facility] for follow up of elevated glucose.  He was hospitalized due to subarachnoid hemorrhage after a fall from an elevated ladder 2.5 weeks ago.  He has injury of hip, back, and shoulder as well.  Found to have elevated glucose (144-175), during days in hospital.  The hip pain was treated with medrol 4 mg for about 1 week.   He has noticed that his glucose is elevated at 240-360 fasting.  And has treated shoulder pain with a shoulder steroid injection today.  He eats a large meal once per day.  He does not like to eat during the day, because it makes him incredibly tired. He is exercising 5 days per week for 30 minutes.          Patient Active Problem List   Diagnosis Date Noted  . Subarachnoid bleed 01/04/2015  . Cerebral contusion and laceration with loss of consciousness 01/04/2015  . Type 2 diabetes mellitus without complication 93/71/6967  . Uncontrolled daytime somnolence 03/07/2013    Past Medical History  Diagnosis Date  . Allergy   . Uncontrolled daytime somnolence 03/07/2013  . Diabetes mellitus without complication     Past Surgical History  Procedure Laterality Date  . Mandible surgery  1987    Social History  Substance Use Topics  . Smoking status: Current Every Day Smoker -- 24 years    Types: Cigarettes  . Smokeless tobacco: None  . Alcohol Use: Yes     Comment: BEER    Family History  Problem Relation Age of Onset  . Asthma Mother   . Arthritis Mother   . COPD Father   . Heart disease Sister     eisenberger syndrome  . Lupus Sister     Allergies  Allergen Reactions  . Morphine     REACTION: difficulty breathing    Medication list has been reviewed and updated.  Current  Outpatient Prescriptions on File Prior to Visit  Medication Sig Dispense Refill  . metFORMIN (GLUCOPHAGE) 500 MG tablet Take 1 tablet (500 mg total) by mouth 2 (two) times daily with a meal. 60 tablet 3  . oxyCODONE (OXY IR/ROXICODONE) 5 MG immediate release tablet Take 1 tablet (5 mg total) by mouth every 4 (four) hours as needed for moderate pain. 30 tablet 0  . amitriptyline (ELAVIL) 25 MG tablet Take 1 tablet (25 mg total) by mouth at bedtime. (Patient not taking: Reported on 01/21/2015) 20 tablet 0  . blood glucose meter kit and supplies KIT Dispense based on patient and insurance preference. Use up to four times daily as directed. (FOR ICD-9 250.00, 250.01). (Patient not taking: Reported on 01/21/2015) 1 each 0  . diphenhydramine-acetaminophen (TYLENOL PM) 25-500 MG TABS Take 1 tablet by mouth at bedtime as needed.     No current facility-administered medications on file prior to visit.    ROS ROS otherwise unremarkable unless listed above.    Physical Examination: BP 110/70 mmHg  Pulse 74  Temp(Src) 97.6 F (36.4 C) (Oral)  Resp 18  Ht 6' 4" (1.93 m)  Wt 193 lb 8 oz (87.771 kg)  BMI 23.56 kg/m2  SpO2 96% Ideal  Body Weight: Weight in (lb) to have BMI = 25: 205  Physical Exam  Constitutional: He is oriented to person, place, and time. He appears well-developed and well-nourished. No distress.  HENT:  Head: Normocephalic and atraumatic.  Eyes: Conjunctivae and EOM are normal. Pupils are equal, round, and reactive to light.  Cardiovascular: Normal rate, regular rhythm and normal heart sounds.   Pulmonary/Chest: Effort normal. No respiratory distress.  Neurological: He is alert and oriented to person, place, and time.  Skin: Skin is warm and dry. He is not diaphoretic.  Psychiatric: He has a normal mood and affect. His behavior is normal.     Results for orders placed or performed in visit on 01/21/15  POCT glucose (manual entry)  Result Value Ref Range   POC Glucose 221 (A)  70 - 99 mg/dl  POCT glycosylated hemoglobin (Hb A1C)  Result Value Ref Range   Hemoglobin A1C 7.5      Assessment and Plan: 49 year old male is here today for chief complaint of elevated glucose.  It is elevated at this time.   -The elevation could be secondary to trauma, then followed by steroid intake.  He is completing the medrol in 2 days.  I have advised that he increase his metformin to 2 tablets per night. -Advised to attempt 3 meals per day. -I am referring him to endocrinology at this time.  -He will check glucose every morning, and 2-3 times 2 hours after dinner meal, and symptomatically. -Advised to journal meals, glucose testing, and time he becomes symptomatic (he will check glucose at this time as well)  Type 2 diabetes mellitus with complication - Plan: POCT glucose (manual entry), POCT glycosylated hemoglobin (Hb A1C), Ambulatory referral to Endocrinology, Lancets 30G MISC,   Type 2 diabetes mellitus without complication - Plan: metFORMIN (GLUCOPHAGE) 500 MG tablet   Ivar Drape, PA-C [facility] 01/21/2015 7:31 PM

## 2015-01-21 NOTE — Patient Instructions (Signed)
Please increase your metformin to one tablet morning, and 2 tablets at night.   Diabetes Mellitus and Food It is important for you to manage your blood sugar (glucose) level. Your blood glucose level can be greatly affected by what you eat. Eating healthier foods in the appropriate amounts throughout the day at about the same time each day will help you control your blood glucose level. It can also help slow or prevent worsening of your diabetes mellitus. Healthy eating may even help you improve the level of your blood pressure and reach or maintain a healthy weight.  HOW CAN FOOD AFFECT ME? Carbohydrates Carbohydrates affect your blood glucose level more than any other type of food. Your dietitian will help you determine how many carbohydrates to eat at each meal and teach you how to count carbohydrates. Counting carbohydrates is important to keep your blood glucose at a healthy level, especially if you are using insulin or taking certain medicines for diabetes mellitus. Alcohol Alcohol can cause sudden decreases in blood glucose (hypoglycemia), especially if you use insulin or take certain medicines for diabetes mellitus. Hypoglycemia can be a life-threatening condition. Symptoms of hypoglycemia (sleepiness, dizziness, and disorientation) are similar to symptoms of having too much alcohol.  If your health care provider has given you approval to drink alcohol, do so in moderation and use the following guidelines:  Women should not have more than one drink per day, and men should not have more than two drinks per day. One drink is equal to:  12 oz of beer.  5 oz of wine.  1 oz of hard liquor.  Do not drink on an empty stomach.  Keep yourself hydrated. Have water, diet soda, or unsweetened iced tea.  Regular soda, juice, and other mixers might contain a lot of carbohydrates and should be counted. WHAT FOODS ARE NOT RECOMMENDED? As you make food choices, it is important to remember that all  foods are not the same. Some foods have fewer nutrients per serving than other foods, even though they might have the same number of calories or carbohydrates. It is difficult to get your body what it needs when you eat foods with fewer nutrients. Examples of foods that you should avoid that are high in calories and carbohydrates but low in nutrients include:  Trans fats (most processed foods list trans fats on the Nutrition Facts label).  Regular soda.  Juice.  Candy.  Sweets, such as cake, pie, doughnuts, and cookies.  Fried foods. WHAT FOODS CAN I EAT? Have nutrient-rich foods, which will nourish your body and keep you healthy. The food you should eat also will depend on several factors, including:  The calories you need.  The medicines you take.  Your weight.  Your blood glucose level.  Your blood pressure level.  Your cholesterol level. You also should eat a variety of foods, including:  Protein, such as meat, poultry, fish, tofu, nuts, and seeds (lean animal proteins are best).  Fruits.  Vegetables.  Dairy products, such as milk, cheese, and yogurt (low fat is best).  Breads, grains, pasta, cereal, rice, and beans.  Fats such as olive oil, trans fat-free margarine, canola oil, avocado, and olives. DOES EVERYONE WITH DIABETES MELLITUS HAVE THE SAME MEAL PLAN? Because every person with diabetes mellitus is different, there is not one meal plan that works for everyone. It is very important that you meet with a dietitian who will help you create a meal plan that is just right for you. Document  Released: 01/22/2005 Document Revised: 05/02/2013 Document Reviewed: 03/24/2013 [facility] Patient Information 2015 [place], [place]. This information is not intended to replace advice given to you by your health care provider. Make sure you discuss any questions you have with your health care provider.   Diabetes and Exercise Diabetes mellitus is a common, chronic disease, in  which the pancreas is unable to adequately control blood glucose (sugar) levels. There are 2 types of diabetes. Type 1 diabetes patients are unable to produce insulin, a hormone that causes sugar in the blood to be stored in the body. People with type 1 diabetes may compensate by giving themselves injections of insulin. Type 2 diabetes involves not producing adequate amounts of insulin to control blood glucose levels. People with type 2 diabetes control their blood glucose by monitoring their food intake or by taking medicine. Exercise is an important part of diabetes treatment. During exercise, the muscles use a greater amount of glucose from the blood for energy. This lowers your blood glucose, which is the same effect you would get from taking insulin. It has been shown that endurance athletes are more sensitive to insulin than inactive people. SYMPTOMS  Many people with a mild case of diabetes have no symptoms. However, if left uncontrolled, diabetes can lead to several complications that could be prevented with treatment of the disease. General symptoms of diabetes include:   Frequent urination (polyuria).  Frequent thirst and drinking (polydipsia).  Increased food consumption (polyphagia).  Fatigue.  Poor exercise performance.  Blurred vision.  Inflammation of the vagina (vaginitis) caused by fungal infections.  Skin infections (uncommon).  Numbness in the feet, caused by nerve injury.  Kidney disease. CAUSES  The cause of most cases of diabetes is unknown. In children, diabetes is often due to an autoimmune response to the cells in the pancreas that make insulin. It is also linked with other diseases, such as cystic fibrosis. Diabetes may have a genetic link. PREVENTION  Athletes should strive to begin exercise with blood glucose in a well-controlled state.  Feet should always be kept clean and dry.  Activities in which low blood sugar levels cannot be treated easily (scuba  diving, rock climbing, swimming) should be avoided.  Anticipate alterations in diet or training to avoid low blood sugar (hypoglycemia) and high blood sugar (hyperglycemia).  Athletes should try to increase sugar consumption after strenuous exercise to avoid hypoglycemia.  Short-acting insulin should not be injected into an actively exercising muscle. The athlete should rest the injection site for about 1 hour after exercise.  Patients with diabetes should get routine checkups of the feet to prevent complications. PROGNOSIS  Exercise provides many benefits to the person with diabetes:   Reduced body fat.  Lower blood pressure.  Often, reduced need for medicines.  Improved exercise tolerance.  Lower insulin levels.  Weight loss.  Improved lipid profile (decreased cholesterol and low-density lipoproteins). RELATED COMPLICATIONS  If performed incorrectly, exercise can result in complications of diabetes:   Poor control of blood sugar, when exercise is performed at the wrong time.  Increase in renal disease, from loss of body fluids (dehydration).  Increased risk of nerve injury (neuropathy) when performing exercises that increase foot injury.  Increased risk of eye problems when performing activities that involve breath holding or lowering or jarring the head.  Increased risk of sudden death from exercise in patients with heart disease.  Worsening of hypertension with heavy lifting (more than 10 lb/4.5 kg). Altered blood glucose and insulin dose as a  result of mild illness that produces loss of appetite.  Altered uptake of insulin after injection when insulin injection site is changed. NOTE: Exercise can lower blood glucose effectively, but the effects are short-lasting (no more than a couple of days). Exercise has been shown to improve your sensitivity to insulin. This may alter how your body responds to a given dose of injected insulin. It is important for every patient with  diabetes to know how his or her body may react to exercise, and to adjust insulin dosages accordingly. TREATMENT  Eat about 1 to 3 hours before exercise.  Check blood glucose immediately before and after exercise.  Stop exercise if blood glucose is more than 250 mg/dL.  Stop exercise if blood glucose is less than 100 mg/dL.  Do not exercise within 1 hour of an insulin injection.  Be prepared to treat low blood glucose while exercising. Keep some sugar product with you, such as a candy bar.  For prolonged exercise, use a sports drink to maintain your glucose level.  Replace used-up glucose in the body after exercise.  Consume fluids during and after exercise to avoid dehydration. SEEK MEDICAL CARE IF:  You have vision changes after a run.  You notice a loss of sensation in your feet after exercise.  You have increased numbness, tingling, or pins and needles sensations after exercise.  You have chest pain during or after exercise.  You have a fast, irregular heartbeat (palpitations) during or after exercise.  Your exercise tolerance gets worse.  You have fainting or dizzy spells for brief periods during or after exercise. Document Released: 04/27/2005 Document Revised: 09/11/2013 Document Reviewed: 08/09/2008 [facility] Patient Information 2015 [place], [place]. This information is not intended to replace advice given to you by your health care provider. Make sure you discuss any questions you have with your health care provider.

## 2015-01-22 ENCOUNTER — Other Ambulatory Visit: Payer: Self-pay | Admitting: Physician Assistant

## 2015-02-01 ENCOUNTER — Encounter: Payer: Self-pay | Admitting: Endocrinology

## 2015-02-01 ENCOUNTER — Ambulatory Visit (INDEPENDENT_AMBULATORY_CARE_PROVIDER_SITE_OTHER): Payer: BLUE CROSS/BLUE SHIELD | Admitting: Endocrinology

## 2015-02-01 VITALS — BP 122/86 | HR 92 | Temp 98.1°F | Ht 78.0 in | Wt 200.0 lb

## 2015-02-01 DIAGNOSIS — Z23 Encounter for immunization: Secondary | ICD-10-CM | POA: Diagnosis not present

## 2015-02-01 DIAGNOSIS — E119 Type 2 diabetes mellitus without complications: Secondary | ICD-10-CM | POA: Diagnosis not present

## 2015-02-01 NOTE — Progress Notes (Signed)
Subjective:    Patient ID: Alex Watson, male    DOB: [birth year], 49 y.o.   MRN: [id number]  HPI pt states DM was dx'ed in early 2016; he has mild if any neuropathy of the lower extremities; he is unaware of any associated chronic complications; he has never been on insulin; pt says his diet and exercise are; he has never had pancreatitis, severe hypoglycemia or DKA.  He works out of town.  Metformin was increased last week.  A few weeks ago, he was rx'ed medrol for a back injury.  He says cbg's are in the low-100's.   Past Medical History  Diagnosis Date  . Allergy   . Uncontrolled daytime somnolence 03/07/2013  . Diabetes mellitus without complication     Past Surgical History  Procedure Laterality Date  . Mandible surgery  1987    Social History   Social History  . Marital Status: Married    Spouse Name: N/A  . Number of Children: N/A  . Years of Education: N/A   Occupational History  . Not on file.   Social History Main Topics  . Smoking status: Current Every Day Smoker -- 24 years    Types: Cigarettes  . Smokeless tobacco: Not on file  . Alcohol Use: Yes     Comment: BEER  . Drug Use: No  . Sexual Activity: Yes     Comment: number of sex partners in the last 14 months 1   Other Topics Concern  . Not on file   Social History Narrative    Current Outpatient Prescriptions on File Prior to Visit  Medication Sig Dispense Refill  . blood glucose meter kit and supplies KIT Dispense based on patient and insurance preference. Use up to four times daily as directed. (FOR ICD-9 250.00, 250.01). 1 each 0  . gabapentin (NEURONTIN) 300 MG capsule Take 300 mg by mouth 3 (three) times daily.    . Lancets 30G MISC Use 1 Lancet 1-3 times per day for glucose check 100 each 1  . metFORMIN (GLUCOPHAGE) 500 MG tablet Take 1 tablet PO morning, and 2 tablets PO at night. 90 tablet 3  . ONE TOUCH ULTRA TEST test strip TEST four times a day 100 each 11  . oxyCODONE (OXY  IR/ROXICODONE) 5 MG immediate release tablet Take 1 tablet (5 mg total) by mouth every 4 (four) hours as needed for moderate pain. 30 tablet 0  . amitriptyline (ELAVIL) 25 MG tablet Take 1 tablet (25 mg total) by mouth at bedtime. (Patient not taking: Reported on 01/21/2015) 20 tablet 0  . diphenhydramine-acetaminophen (TYLENOL PM) 25-500 MG TABS Take 1 tablet by mouth at bedtime as needed.    . methylPREDNISolone (MEDROL) 4 MG tablet Take 4 mg by mouth daily.     No current facility-administered medications on file prior to visit.    Allergies  Allergen Reactions  . Morphine     REACTION: difficulty breathing    Family History  Problem Relation Age of Onset  . Asthma Mother   . Arthritis Mother   . COPD Father   . Heart disease Sister     eisenberger syndrome  . Lupus Sister   . Diabetes Father     BP 122/86 mmHg  Pulse 92  Temp(Src) 98.1 F (36.7 C) (Oral)  Ht 6' 6"  (1.981 m)  Wt 200 lb (90.719 kg)  BMI 23.12 kg/m2  SpO2 93%   Review of Systems denies weight loss, headache, chest pain, sob,  n/v, urinary frequency, muscle cramps, depression, cold intolerance, rhinorrhea, and easy bruising.  He has slight blurry vision.  He has diaphoresis at night.    Objective:   Physical Exam VS: see vs page GEN: no distress HEAD: head: no deformity eyes: no periorbital swelling, no proptosis external nose and ears are normal mouth: no lesion seen NECK: supple, thyroid is not enlarged CHEST WALL: no deformity LUNGS: clear to auscultation BREASTS:  No gynecomastia CV: reg rate and rhythm, no murmur.   ABD: abdomen is soft, nontender.  no hepatosplenomegaly.  not distended.  no hernia MUSCULOSKELETAL: muscle bulk and strength are grossly normal.  no obvious joint swelling.  gait is normal and steady EXTEMITIES: no deformity.  no ulcer on the feet.  feet are of normal color and temp.  no edema.  Left great toenail is deformed and ingrown.   PULSES: dorsalis pedis intact bilat.  no  carotid bruit NEURO:  cn 2-12 grossly intact.   readily moves all 4's.  sensation is intact to touch on the feet SKIN:  Normal texture and temperature.  No rash or suspicious lesion is visible.   NODES:  None palpable at the neck PSYCH: alert, well-oriented.  Does not appear anxious nor depressed.    Lab Results  Component Value Date   HGBA1C 7.5 01/21/2015   i personally reviewed electrocardiogram tracing (01/04/15): Indication: DM Impression: normal    Assessment & Plan:  DM: new to me: he needs increased rx, if it can be done with a regimen that avoids or minimizes hypoglycemia.    Patient is advised the following: Patient Instructions  good diet and exercise significantly improve the control of your diabetes.  please let me know if you wish to be referred to a dietician.  high blood sugar is very risky to your health.  you should see an eye doctor and dentist every year.  It is very important to get all recommended vaccinations.  controlling your blood pressure and cholesterol drastically reduces the damage diabetes does to your body.  Those who smoke should quit.  please discuss these with your doctor.  check your blood sugar once a day.  vary the time of day when you check, between before the 3 meals, and at bedtime.  also check if you have symptoms of your blood sugar being too high or too low.  please keep a record of the readings and bring it to your next appointment here.  You can write it on any piece of paper.  please call us sooner if your blood sugar goes below 70.   Please call in a 1-2 weeks if you have a lot of readings in the mid-100's, so we can add another med, called "tradjenta." Please come back for a follow-up appointment in 2-3 months You may be developing type 1 (what we used to call "childhood onset") diabetes, which can only be treated with insulin.  Therefore, please see Vaughan Basta the same day, to learn about injecting insulin.

## 2015-02-01 NOTE — Patient Instructions (Addendum)
good diet and exercise significantly improve the control of your diabetes.  please let me know if you wish to be referred to a dietician.  high blood sugar is very risky to your health.  you should see an eye doctor and dentist every year.  It is very important to get all recommended vaccinations.  controlling your blood pressure and cholesterol drastically reduces the damage diabetes does to your body.  Those who smoke should quit.  please discuss these with your doctor.  check your blood sugar once a day.  vary the time of day when you check, between before the 3 meals, and at bedtime.  also check if you have symptoms of your blood sugar being too high or too low.  please keep a record of the readings and bring it to your next appointment here.  You can write it on any piece of paper.  please call us sooner if your blood sugar goes below 70.   Please call in a 1-2 weeks if you have a lot of readings in the mid-100's, so we can add another med, called "tradjenta." Please come back for a follow-up appointment in 2-3 months You may be developing type 1 (what we used to call "childhood onset") diabetes, which can only be treated with insulin.  Therefore, please see Bonita Quin the same day, to learn about injecting insulin.

## 2015-04-01 ENCOUNTER — Encounter: Payer: Self-pay | Admitting: Gastroenterology

## 2015-04-01 ENCOUNTER — Ambulatory Visit (INDEPENDENT_AMBULATORY_CARE_PROVIDER_SITE_OTHER): Payer: BLUE CROSS/BLUE SHIELD | Admitting: Endocrinology

## 2015-04-01 ENCOUNTER — Encounter: Payer: BLUE CROSS/BLUE SHIELD | Attending: Endocrinology | Admitting: Nutrition

## 2015-04-01 ENCOUNTER — Encounter: Payer: Self-pay | Admitting: Endocrinology

## 2015-04-01 ENCOUNTER — Encounter: Payer: Self-pay | Admitting: Nutrition

## 2015-04-01 VITALS — BP 110/64 | HR 85 | Temp 98.1°F | Ht 78.0 in | Wt 201.0 lb

## 2015-04-01 DIAGNOSIS — E119 Type 2 diabetes mellitus without complications: Secondary | ICD-10-CM

## 2015-04-01 DIAGNOSIS — Z713 Dietary counseling and surveillance: Secondary | ICD-10-CM | POA: Diagnosis present

## 2015-04-01 LAB — POCT GLYCOSYLATED HEMOGLOBIN (HGB A1C): HEMOGLOBIN A1C: 8.3

## 2015-04-01 MED ORDER — LINAGLIPTIN-METFORMIN HCL ER 2.5-1000 MG PO TB24: 2.0000 | ORAL_TABLET | ORAL | Status: DC

## 2015-04-01 MED ORDER — VARENICLINE TARTRATE 0.5 MG PO TABS: 0.5000 mg | ORAL_TABLET | Freq: Two times a day (BID) | ORAL | Status: DC

## 2015-04-01 NOTE — Progress Notes (Signed)
Subjective:    Patient ID: Alex Watson, male    DOB: 11-Mar-1966, 49 y.o.   MRN: [id number]  HPI Pt returns for f/u of diabetes mellitus: DM type: 2, but he is presumed to be developing type 1, due to lean body habitus. Dx'ed: 4098 Complications: none Therapy: metformin DKA: never Severe hypoglycemia: never Pancreatitis: never Other: he has never been on insulin; he works out of town Interval history: No recent steroids.  no cbg record, but states cbg's are in the 200's.  He eats 1 meal per day, at supper.  Past Medical History  Diagnosis Date  . Allergy   . Uncontrolled daytime somnolence 03/07/2013  . Diabetes mellitus without complication [facility])     Past Surgical History  Procedure Laterality Date  . Mandible surgery  1987    Social History   Social History  . Marital Status: Married    Spouse Name: N/A  . Number of Children: N/A  . Years of Education: N/A   Occupational History  . Not on file.   Social History Main Topics  . Smoking status: Current Every Day Smoker -- 24 years    Types: Cigarettes  . Smokeless tobacco: Not on file  . Alcohol Use: Yes     Comment: BEER  . Drug Use: No  . Sexual Activity: Yes     Comment: number of sex partners in the last 37 months 1   Other Topics Concern  . Not on file   Social History Narrative    Current Outpatient Prescriptions on File Prior to Visit  Medication Sig Dispense Refill  . blood glucose meter kit and supplies KIT Dispense based on patient and insurance preference. Use up to four times daily as directed. (FOR ICD-9 250.00, 250.01). 1 each 0  . diphenhydramine-acetaminophen (TYLENOL PM) 25-500 MG TABS Take 1 tablet by mouth at bedtime as needed.    . Lancets 30G MISC Use 1 Lancet 1-3 times per day for glucose check 100 each 1  . ONE TOUCH ULTRA TEST test strip TEST four times a day 100 each 11  . gabapentin (NEURONTIN) 300 MG capsule Take 300 mg by mouth 3 (three) times daily.     No current  facility-administered medications on file prior to visit.    Allergies  Allergen Reactions  . Morphine     REACTION: difficulty breathing    Family History  Problem Relation Age of Onset  . Asthma Mother   . Arthritis Mother   . COPD Father   . Heart disease Sister     eisenberger syndrome  . Lupus Sister   . Diabetes Father     BP 110/64 mmHg  Pulse 85  Temp(Src) 98.1 F (36.7 C) (Oral)  Ht 6' 6"  (1.981 m)  Wt 201 lb (91.173 kg)  BMI 23.23 kg/m2  SpO2 98%   Review of Systems He denies hypoglycemia.      Objective:   Physical Exam VITAL SIGNS:  See vs page GENERAL: no distress Pulses: dorsalis pedis intact bilat.   MSK: no deformity of the feet CV: no leg edema Skin:  no ulcer on the feet.  normal color and temp on the feet. Neuro: sensation is intact to touch on the feet.    A1c=8.3%    Assessment & Plan:  DM: he needs increased rx.  I discussed with Leonia Reader, RN, the need to learn about insulin, in case he needs it on short notice.  Patient is advised the following:  Patient Instructions  check your blood sugar once a day.  vary the time of day when you check, between before the 3 meals, and at bedtime.  also check if you have symptoms of your blood sugar being too high or too low.  please keep a record of the readings and bring it to your next appointment here.  You can write it on any piece of paper.  please call us sooner if your blood sugar goes below 70.   i have sent a prescription to your pharmacy, to change metformin to "jentadueto" (contains metformin and another med).   Please call next week, to tell us how the blood sugar is doing.  If necessary, we'll add "glimepiride." i have also sent a prescription to your pharmacy, for chantix.  Please come back for a follow-up appointment in 2-3 months.  You may be developing type 1 (what we used to call "childhood onset") diabetes, which can only be treated with insulin.  Therefore, please see Vaughan Basta  today, to learn about injecting insulin.

## 2015-04-01 NOTE — Patient Instructions (Signed)
Inject insulin once a day as instructed by Dr. Everardo AllEllison Call if questions:  [phone]

## 2015-04-01 NOTE — Patient Instructions (Addendum)
check your blood sugar once a day.  vary the time of day when you check, between before the 3 meals, and at bedtime.  also check if you have symptoms of your blood sugar being too high or too low.  please keep a record of the readings and bring it to your next appointment here.  You can write it on any piece of paper.  please call us sooner if your blood sugar goes below 70.   i have sent a prescription to your pharmacy, to change metformin to "jentadueto" (contains metformin and another med).   Please call next week, to tell us how the blood sugar is doing.  If necessary, we'll add "glimepiride." i have also sent a prescription to your pharmacy, for chantix.  Please come back for a follow-up appointment in 2-3 months.  You may be developing type 1 (what we used to call "childhood onset") diabetes, which can only be treated with insulin.  Therefore, please see Bonita QuinLinda today, to learn about injecting insulin.

## 2015-04-01 NOTE — Progress Notes (Signed)
Patient was instructed in the use of an insulin pen.  We discussed the steps for this as well has how and where to inject this insulin.  We also reviewed the need to rotate injection  Sites.  He reported good understanding of this. We also reviewed low blood sugars--symptoms and treatments.  He had no final questions.

## 2015-04-02 ENCOUNTER — Telehealth: Payer: Self-pay | Admitting: Endocrinology

## 2015-04-02 MED ORDER — METFORMIN HCL ER 500 MG PO TB24: 1000.0000 mg | ORAL_TABLET | Freq: Two times a day (BID) | ORAL | Status: DC

## 2015-04-02 MED ORDER — GLIMEPIRIDE 4 MG PO TABS: 4.0000 mg | ORAL_TABLET | Freq: Every day | ORAL | Status: DC

## 2015-04-02 NOTE — Telephone Encounter (Signed)
I contacted the pt's wife and advised of note below. She voiced understanding.  

## 2015-04-02 NOTE — Telephone Encounter (Signed)
Patients wife called to see if there was a generic or something else that her husband can take for newly prescribed Rx  Rx: Jentadueto  Please advise    Thank you

## 2015-04-02 NOTE — Telephone Encounter (Signed)
Ok, i have sent 2 prescriptions to your pharmacy: Metformin and glimepiride--both are cheap i'll see you next time.

## 2015-04-02 NOTE — Telephone Encounter (Signed)
See note below and please advise, Thanks! 

## 2015-04-09 ENCOUNTER — Telehealth: Payer: Self-pay | Admitting: Endocrinology

## 2015-04-09 MED ORDER — PIOGLITAZONE HCL 45 MG PO TABS: 45.0000 mg | ORAL_TABLET | Freq: Every day | ORAL | Status: DC

## 2015-04-09 MED ORDER — GLIMEPIRIDE 4 MG PO TABS: 2.0000 mg | ORAL_TABLET | Freq: Two times a day (BID) | ORAL | Status: DC

## 2015-04-09 MED ORDER — GLIMEPIRIDE 4 MG PO TABS: 2.0000 mg | ORAL_TABLET | ORAL | Status: DC

## 2015-04-09 NOTE — Telephone Encounter (Signed)
please call patient: i have received your blood sugar record Please reduce the glimepiride to 2 mg each morning i have sent a prescription to your pharmacy, to add "pioglitizone."  Please note that this takes time to work (a few weeks) i'll see you next time.

## 2015-04-10 NOTE — Telephone Encounter (Signed)
I contacted the patient and left a voicemail advising of the note below. Requested a call back it the patient would like to discus.

## 2015-04-19 ENCOUNTER — Telehealth: Payer: Self-pay | Admitting: Endocrinology

## 2015-04-19 DIAGNOSIS — E119 Type 2 diabetes mellitus without complications: Secondary | ICD-10-CM

## 2015-04-19 NOTE — Telephone Encounter (Signed)
Patient wife ask if patient could get a referral to see a opthalmologic, please Phone # [phone] please advise

## 2015-04-19 NOTE — Telephone Encounter (Signed)
Patient's wife advised that referral has been completed.

## 2015-04-19 NOTE — Telephone Encounter (Signed)
done

## 2015-04-29 ENCOUNTER — Telehealth: Payer: Self-pay | Admitting: Endocrinology

## 2015-04-29 MED ORDER — VARENICLINE TARTRATE 1 MG PO TABS: 1.0000 mg | ORAL_TABLET | Freq: Two times a day (BID) | ORAL | Status: DC

## 2015-04-29 NOTE — Telephone Encounter (Signed)
Patients wife called stating that her husband is still smoking and would like a refill on his Rx  His progress went from 1.5 pack to 1/2 pack daily   Rx: Chantix (can we prescribe a higher dosage)  Pharmacy: [facility] on [address]   Please advise patient of this change    Thank you

## 2015-04-29 NOTE — Telephone Encounter (Signed)
Pt's wife advised of note below and voiced understanding.  

## 2015-04-29 NOTE — Telephone Encounter (Signed)
Ok, i have sent a prescription to your pharmacy  

## 2015-04-29 NOTE — Telephone Encounter (Signed)
See note below and please advise, Thanks! 

## 2015-05-02 DIAGNOSIS — Z7689 Persons encountering health services in other specified circumstances: Secondary | ICD-10-CM

## 2015-05-15 ENCOUNTER — Telehealth: Payer: Self-pay | Admitting: Endocrinology

## 2015-05-15 NOTE — Telephone Encounter (Signed)
I contacted the pt's wife and advised of note below. She verbalized understanding.

## 2015-05-15 NOTE — Telephone Encounter (Signed)
please call patient: Ins is declining chantix until you meet several criteria. Please see your pcp to consider this rx.

## 2015-05-22 ENCOUNTER — Encounter: Payer: Self-pay | Admitting: Endocrinology

## 2015-05-22 LAB — HM DIABETES EYE EXAM

## 2015-07-02 ENCOUNTER — Ambulatory Visit: Payer: Self-pay | Admitting: Endocrinology

## 2015-07-04 ENCOUNTER — Ambulatory Visit (INDEPENDENT_AMBULATORY_CARE_PROVIDER_SITE_OTHER): Payer: 59 | Admitting: Family Medicine

## 2015-07-04 ENCOUNTER — Telehealth: Payer: Self-pay

## 2015-07-04 VITALS — BP 116/60 | HR 89 | Temp 98.8°F | Resp 16 | Ht 78.0 in | Wt 206.4 lb

## 2015-07-04 DIAGNOSIS — M4802 Spinal stenosis, cervical region: Secondary | ICD-10-CM | POA: Diagnosis not present

## 2015-07-04 DIAGNOSIS — M5030 Other cervical disc degeneration, unspecified cervical region: Secondary | ICD-10-CM

## 2015-07-04 DIAGNOSIS — M5412 Radiculopathy, cervical region: Secondary | ICD-10-CM | POA: Diagnosis not present

## 2015-07-04 MED ORDER — IBUPROFEN 800 MG PO TABS: 800.0000 mg | ORAL_TABLET | Freq: Three times a day (TID) | ORAL | Status: DC

## 2015-07-04 MED ORDER — VARENICLINE TARTRATE 1 MG PO TABS: 1.0000 mg | ORAL_TABLET | Freq: Two times a day (BID) | ORAL | Status: DC

## 2015-07-04 MED ORDER — BUPROPION HCL ER (SR) 150 MG PO TB12: ORAL_TABLET | ORAL | Status: DC

## 2015-07-04 NOTE — Progress Notes (Addendum)
Subjective:    Patient ID: Alex Watson, male    DOB: 1965-08-26, 50 y.o.   MRN: [id number] By signing my name below, I, Zola Button, attest that this documentation has been prepared under the direction and in the presence of Delman Cheadle, MD.  Electronically Signed: Zola Button, Medical Scribe. 07/04/2015. 1:44 PM.  Chief Complaint  Patient presents with  . Shoulder Pain    pt states he has numbness from his shoulder down his right arm to his thumb/x 3 weeks  . Neck Pain    x 3 weeks  . Arm Pain  . Medication Refill    pt would like to get back on chantix    HPI HPI Comments: Alex Watson is a 50 y.o. male who presents to the [facility] complaining of right shoulder pain that started 3 weeks ago.   Patient was hospitalized in August of 2016 for traumatic brain injury and subarachnoid hemorrhage. Has tried Chantix multiple times. He did have a CT of his C-spine during his hospitalization which showed multi-level cervical spine degeneration and spinal stenosis. He also had shoulder XRs at that time which were normal. He has type II DM followed by Dr. Loanne Drilling. Apparently on Chantix, he had decreased smoking from 1.5 ppd to 0.5 ppd.   He has burning/tingling from the shoulder to his right arm. He reports having numbness to his right thumb which is now starting to affect the radial aspect of the wrist. Patient also reports having some neck tightness which worsens throughout the day. He has been taking Tylenol for the pain. The pain is improved when his shoulders are fully abducted. Patient denies weakness, indigestion, and other GI symptoms. He also denies history of similar symptoms. He missed his appointment with Dr. Loanne Drilling yesterday. He was seen at [facility] 1 month ago and was released with no restrictions and no cautions regarding NSAID use. Told him to call back if he has additional problems.  Patient is also requesting to get back on Chantix. He changed  insurances and states he has to get the prescription from a PCP. He states his insurance may not cover Chantix and may only cover Wellbutrin. Patient has started smoking more again because he has been out of the Chantix for a month. He has never been on Wellbutrin.  Depression screen [facility] 2/9 07/04/2015 01/21/2015  Decreased Interest 0 0  Down, Depressed, Hopeless 0 0  PHQ - 2 Score 0 0    Past Medical History  Diagnosis Date  . Allergy   . Uncontrolled daytime somnolence 03/07/2013  . Diabetes mellitus without complication [facility])    Past Surgical History  Procedure Laterality Date  . Mandible surgery  1987   Current Outpatient Prescriptions on File Prior to Visit  Medication Sig Dispense Refill  . blood glucose meter kit and supplies KIT Dispense based on patient and insurance preference. Use up to four times daily as directed. (FOR ICD-9 250.00, 250.01). 1 each 0  . gabapentin (NEURONTIN) 300 MG capsule Take 300 mg by mouth 3 (three) times daily. Reported on 07/04/2015    . glimepiride (AMARYL) 4 MG tablet Take 0.5 tablets (2 mg total) by mouth every morning. 30 tablet 3  . Lancets 30G MISC Use 1 Lancet 1-3 times per day for glucose check 100 each 1  . metFORMIN (GLUCOPHAGE-XR) 500 MG 24 hr tablet Take 2 tablets (1,000 mg total) by mouth 2 (two) times daily. 120 tablet 11  .  ONE TOUCH ULTRA TEST test strip TEST four times a day 100 each 11  . diphenhydramine-acetaminophen (TYLENOL PM) 25-500 MG TABS Take 1 tablet by mouth at bedtime as needed. Reported on 07/04/2015    . pioglitazone (ACTOS) 45 MG tablet Take 1 tablet (45 mg total) by mouth daily. (Patient not taking: Reported on 07/04/2015) 30 tablet 11   No current facility-administered medications on file prior to visit.   Allergies  Allergen Reactions  . Morphine     REACTION: difficulty breathing   Family History  Problem Relation Age of Onset  . Asthma Mother   . Arthritis Mother   . COPD Father   . Heart disease Sister      eisenberger syndrome  . Lupus Sister   . Diabetes Father    Social History   Social History  . Marital Status: Married    Spouse Name: N/A  . Number of Children: N/A  . Years of Education: N/A   Social History Main Topics  . Smoking status: Current Every Day Smoker -- 24 years    Types: Cigarettes  . Smokeless tobacco: None  . Alcohol Use: Yes     Comment: BEER  . Drug Use: No  . Sexual Activity: Yes     Comment: number of sex partners in the last 87 months 1   Other Topics Concern  . None   Social History Narrative     Review of Systems  Constitutional: Negative for fever, chills, diaphoresis, activity change and appetite change.  Eyes: Negative for visual disturbance.  Respiratory: Positive for cough and shortness of breath.   Gastrointestinal: Negative.  Negative for nausea, vomiting, abdominal pain, diarrhea and constipation.  Musculoskeletal: Positive for myalgias, arthralgias, gait problem and neck stiffness. Negative for back pain, joint swelling and neck pain.  Skin: Negative for color change, pallor and wound.  Neurological: Positive for numbness and headaches. Negative for dizziness, weakness and light-headedness.  Hematological: Negative for adenopathy. Does not bruise/bleed easily.  Psychiatric/Behavioral: Positive for sleep disturbance. Negative for dysphoric mood.       Objective:  BP 116/60 mmHg  Pulse 89  Temp(Src) 98.8 F (37.1 C) (Oral)  Resp 16  Ht 6' 6"  (1.981 m)  Wt 206 lb 6.4 oz (93.622 kg)  BMI 23.86 kg/m2  SpO2 98%  Physical Exam  Constitutional: He is oriented to person, place, and time. He appears well-developed and well-nourished. No distress.  HENT:  Head: Normocephalic and atraumatic.  Mouth/Throat: Oropharynx is clear and moist. No oropharyngeal exudate.  Eyes: Pupils are equal, round, and reactive to light.  Neck: Neck supple.  Cardiovascular: Normal rate.   Pulmonary/Chest: Effort normal.  Musculoskeletal: He exhibits no  edema.  Normal ROM.  Neurological: He is alert and oriented to person, place, and time. No cranial nerve deficit.  Reflex Scores:      Tricep reflexes are 2+ on the right side and 1+ on the left side.      Bicep reflexes are 0 on the right side and 1+ on the left side.      Brachioradialis reflexes are 0 on the right side and 0 on the left side. 4+/5 on right wrist extension and right bicep. 5/5 on deltoids, triceps, and wrist flexion. 4+/5 on right rotator cuff.  Skin: Skin is warm and dry. No rash noted.  Psychiatric: He has a normal mood and affect. His behavior is normal.  Nursing note and vitals reviewed.         Assessment &  Plan:  If Chantix is not approved, will try patient on low-dose bupropion. 1. Cervical radiculopathy at C6 - cannot do trial of prednisone due ot T2DM so high dose inflammatories, ice neck, do not strain.   2. Spinal stenosis in cervical region - seen on CT - needs MRI since spinal stenosis seen on CT with sx now sig worsening.  3. DDD (degenerative disc disease), cervical     Orders Placed This Encounter  Procedures  . MR Cervical Spine Wo Contrast    Standing Status: Future     Number of Occurrences:      Standing Expiration Date: 08/31/2016    Order Specific Question:  Reason for Exam (SYMPTOM  OR DIAGNOSIS REQUIRED)    Answer:  worsening C6 radiculopathy with numbness and weakness, loss of DTRs, spinal stenosis seen on CT 12/2014    Order Specific Question:  Preferred imaging location?    Answer:  External    Order Specific Question:  Does the patient have a pacemaker or implanted devices?    Answer:  No    Order Specific Question:  What is the patient's sedation requirement?    Answer:  No Sedation  . Ambulatory referral to Neurosurgery    Referral Priority:  Urgent    Referral Type:  Surgical    Referral Reason:  Specialty Services Required    Requested Specialty:  Neurosurgery    Number of Visits Requested:  1    Meds ordered this  encounter  Medications  . DISCONTD: varenicline (CHANTIX) 1 MG tablet    Sig: Take 1 tablet (1 mg total) by mouth 2 (two) times daily.    Dispense:  60 tablet    Refill:  5  . ibuprofen (ADVIL,MOTRIN) 800 MG tablet    Sig: Take 1 tablet (800 mg total) by mouth 3 (three) times daily.    Dispense:  90 tablet    Refill:  0  . buPROPion (WELLBUTRIN SR) 150 MG 12 hr tablet    Sig: Take 1 a day for 3 days then increase to twice a day    Dispense:  60 tablet    Refill:  2    I personally performed the services described in this documentation, which was scribed in my presence. The recorded information has been reviewed and considered, and addended by me as needed.  Delman Cheadle, MD MPH

## 2015-07-04 NOTE — Telephone Encounter (Addendum)
Will try bupropion as we discussed at OV.  If it is not working, let us know and maybe we can complete a PA for the chantix.  He should start the bupropion in the morning for 3 days then increase to twice a day. He should separate the doses by 8 hours and try to take the second dose no later than 6 pm to not disturb sleep.  He should try to stop smoking after he has been on the medicine for a week.

## 2015-07-04 NOTE — Telephone Encounter (Signed)
Pt's spouse called stating that insurance will not cover Chantix; pt would like consultation for alt. Rx  [phone]

## 2015-07-04 NOTE — Patient Instructions (Signed)

## 2015-07-08 NOTE — Telephone Encounter (Signed)
Advised message to his wife.

## 2015-07-10 ENCOUNTER — Encounter: Payer: Self-pay | Admitting: Endocrinology

## 2015-07-10 ENCOUNTER — Ambulatory Visit (INDEPENDENT_AMBULATORY_CARE_PROVIDER_SITE_OTHER): Payer: Self-pay | Admitting: Endocrinology

## 2015-07-10 VITALS — BP 122/70 | HR 78 | Temp 97.9°F | Ht 78.0 in | Wt 208.0 lb

## 2015-07-10 DIAGNOSIS — E119 Type 2 diabetes mellitus without complications: Secondary | ICD-10-CM

## 2015-07-10 LAB — POCT GLYCOSYLATED HEMOGLOBIN (HGB A1C): HEMOGLOBIN A1C: 6.5

## 2015-07-10 MED ORDER — GLIMEPIRIDE 1 MG PO TABS: 1.0000 mg | ORAL_TABLET | Freq: Every day | ORAL | Status: DC

## 2015-07-10 NOTE — Progress Notes (Signed)
Subjective:    Patient ID: Alex Watson, male    DOB: [birth year], 50 y.o.   MRN: [id number]  HPI Pt returns for f/u of diabetes mellitus: DM type: he is presumed to be developing type 1, due to lean body habitus. Dx'ed: 1747 Complications: none.   Therapy: metformin. DKA: never. Severe hypoglycemia: never. Pancreatitis: never. Other: he has never been on insulin; he works out of town; he requests generic meds only Interval history: No recent steroids.  no cbg record, but states cbg's vary from 55-150.  He eats 1 meal per day, at supper.  Past Medical History  Diagnosis Date  . Allergy   . Uncontrolled daytime somnolence 03/07/2013  . Diabetes mellitus without complication [facility])     Past Surgical History  Procedure Laterality Date  . Mandible surgery  1987    Social History   Social History  . Marital Status: Married    Spouse Name: N/A  . Number of Children: N/A  . Years of Education: N/A   Occupational History  . Not on file.   Social History Main Topics  . Smoking status: Current Every Day Smoker -- 24 years    Types: Cigarettes  . Smokeless tobacco: Not on file  . Alcohol Use: Yes     Comment: BEER  . Drug Use: No  . Sexual Activity: Yes     Comment: number of sex partners in the last 21 months 1   Other Topics Concern  . Not on file   Social History Narrative    Current Outpatient Prescriptions on File Prior to Visit  Medication Sig Dispense Refill  . blood glucose meter kit and supplies KIT Dispense based on patient and insurance preference. Use up to four times daily as directed. (FOR ICD-9 250.00, 250.01). 1 each 0  . buPROPion (WELLBUTRIN SR) 150 MG 12 hr tablet Take 1 a day for 3 days then increase to twice a day 60 tablet 2  . diphenhydramine-acetaminophen (TYLENOL PM) 25-500 MG TABS Take 1 tablet by mouth at bedtime as needed. Reported on 07/04/2015    . ibuprofen (ADVIL,MOTRIN) 800 MG tablet Take 1 tablet (800 mg total) by mouth 3 (three) times  daily. 90 tablet 0  . Lancets 30G MISC Use 1 Lancet 1-3 times per day for glucose check 100 each 1  . metFORMIN (GLUCOPHAGE-XR) 500 MG 24 hr tablet Take 2 tablets (1,000 mg total) by mouth 2 (two) times daily. 120 tablet 11  . ONE TOUCH ULTRA TEST test strip TEST four times a day 100 each 11  . pioglitazone (ACTOS) 45 MG tablet Take 1 tablet (45 mg total) by mouth daily. 30 tablet 11  . gabapentin (NEURONTIN) 300 MG capsule Take 300 mg by mouth 3 (three) times daily. Reported on 07/10/2015     No current facility-administered medications on file prior to visit.    Allergies  Allergen Reactions  . Morphine     REACTION: difficulty breathing    Family History  Problem Relation Age of Onset  . Asthma Mother   . Arthritis Mother   . COPD Father   . Heart disease Sister     eisenberger syndrome  . Lupus Sister   . Diabetes Father     BP 122/70 mmHg  Pulse 78  Temp(Src) 97.9 F (36.6 C) (Oral)  Ht 6' 6"  (1.981 m)  Wt 208 lb (94.348 kg)  BMI 24.04 kg/m2  SpO2 98%  Review of Systems Denies LOC    Objective:  Physical Exam VITAL SIGNS:  See vs page.   GENERAL: no distress.   Pulses: dorsalis pedis intact bilat.   MSK: no deformity of the feet.   CV: no leg edema.  Skin:  no ulcer on the feet.  normal color and temp on the feet.  Neuro: sensation is intact to touch on the feet. Ext: the right great toe is absent.    A1c=6.5%    Assessment & Plan:  DM: overcontrolled, for this SU-containing regimen.    Patient is advised the following: Patient Instructions  check your blood sugar once a day.  vary the time of day when you check, between before the 3 meals, and at bedtime.  also check if you have symptoms of your blood sugar being too high or too low.  please keep a record of the readings and bring it to your next appointment here.  You can write it on any piece of paper.  please call us sooner if your blood sugar goes below 70.   Please reduce the glimepiride to 1 mg  daily (with your biggest meal).  i have sent a prescription to your pharmacy.  Please come back for a follow-up appointment in 5-6 months.  You may be developing type 1 (what we used to call "childhood onset") diabetes, which can only be treated with insulin.  Therefore, please see Vaughan Basta today, to learn about injecting insulin.

## 2015-07-10 NOTE — Patient Instructions (Signed)
check your blood sugar once a day.  vary the time of day when you check, between before the 3 meals, and at bedtime.  also check if you have symptoms of your blood sugar being too high or too low.  please keep a record of the readings and bring it to your next appointment here.  You can write it on any piece of paper.  please call us sooner if your blood sugar goes below 70.   Please reduce the glimepiride to 1 mg daily (with your biggest meal).  i have sent a prescription to your pharmacy.  Please come back for a follow-up appointment in 5-6 months.  You may be developing type 1 (what we used to call "childhood onset") diabetes, which can only be treated with insulin.  Therefore, please see Bonita Quin today, to learn about injecting insulin.

## 2015-07-16 ENCOUNTER — Ambulatory Visit
Admission: RE | Admit: 2015-07-16 | Discharge: 2015-07-16 | Disposition: A | Discharge status: Home / Self Care | Payer: 59 | Source: Ambulatory Visit | Attending: Family Medicine | Admitting: Family Medicine

## 2015-07-16 ENCOUNTER — Other Ambulatory Visit: Payer: Self-pay | Admitting: Family Medicine

## 2015-07-16 DIAGNOSIS — M5412 Radiculopathy, cervical region: Secondary | ICD-10-CM

## 2015-07-16 DIAGNOSIS — Z77018 Contact with and (suspected) exposure to other hazardous metals: Secondary | ICD-10-CM

## 2015-07-16 DIAGNOSIS — M5030 Other cervical disc degeneration, unspecified cervical region: Secondary | ICD-10-CM

## 2015-07-16 DIAGNOSIS — M4802 Spinal stenosis, cervical region: Secondary | ICD-10-CM

## 2015-08-06 ENCOUNTER — Telehealth: Payer: Self-pay

## 2015-08-06 NOTE — Telephone Encounter (Signed)
Pt is calling stating that he would like to change the stop smoking medication to chantix and that it is going to need prior authorization   Best number [phone]

## 2015-08-07 NOTE — Telephone Encounter (Signed)
Called pt who reported that he has tried the nicotine patches in the past and neither these or the wellbutrin have been effective. He stated he tried the Chantix a couple of months ago and it seemed to be helping, but just as his dose was increasing he changed ins and was no longer able to get it. I completed PA on covermymeds. Pending.

## 2015-08-08 NOTE — Telephone Encounter (Signed)
PA approved through 08/06/16. Notified pt's wife.

## 2015-08-14 ENCOUNTER — Telehealth: Payer: Self-pay

## 2015-08-14 DIAGNOSIS — M5412 Radiculopathy, cervical region: Secondary | ICD-10-CM

## 2015-08-14 NOTE — Telephone Encounter (Signed)
Pam would like the Dr to refer her husband to Dr Anne HahnWillis, we had referred him before somewhere but they would like to have a second opinion. Please call [phone]

## 2015-08-15 NOTE — Telephone Encounter (Signed)
Dr Shaw

## 2015-08-15 NOTE — Telephone Encounter (Signed)
I had referred him to neurosurgery for this - yes absolutely support him getting a second opinion and have placed a referral to neurology Dr. Anne HahnWillis per pt (and wife) request. However, if Dr. Anne HahnWillis is hard to get in to there are some excellent orthopedic spinal surgeons in town who would also likely be of help to pt such as [facility] or [facility] - let me know if they would like to proceed with ortho depending on response from Dr. Anne HahnWillis' office.

## 2015-08-16 NOTE — Telephone Encounter (Signed)
The referral has been [facility] to [facility], Dr Anne HahnWillis' office.

## 2015-08-16 NOTE — Telephone Encounter (Signed)
FYI referrals.

## 2015-08-21 ENCOUNTER — Telehealth: Payer: Self-pay

## 2015-08-21 DIAGNOSIS — M5412 Radiculopathy, cervical region: Secondary | ICD-10-CM

## 2015-08-21 NOTE — Telephone Encounter (Signed)
SHAW - The second opinion referral was to be for [place] [facility] [place] [place]. [place] [facility] [place].  Please refer to [facility] [place] [facility] [place].

## 2015-08-22 NOTE — Telephone Encounter (Signed)
Ok -- order in 

## 2015-09-02 ENCOUNTER — Ambulatory Visit: Payer: 59 | Admitting: Neurology

## 2015-11-15 DIAGNOSIS — M4722 Other spondylosis with radiculopathy, cervical region: Secondary | ICD-10-CM | POA: Insufficient documentation

## 2015-12-09 ENCOUNTER — Telehealth: Payer: Self-pay | Admitting: Endocrinology

## 2015-12-09 NOTE — Telephone Encounter (Signed)
PT wife called and said this weekend PT was having extremely high blood sugars and would like to know what to do until he is able to see Dr. Everardo All

## 2015-12-09 NOTE — Telephone Encounter (Signed)
He will need to see Alex Watson tomorrow to start insulin: He will start with either NovoLog or Humalog mix insulin 10 units twice a day before meals.  I may need to see him on Friday to reassess

## 2015-12-09 NOTE — Telephone Encounter (Signed)
See below. I contacted the pt's wife and she stated on Saturday at 9:00 am (not fasting) his blood sugar was 465. 1 hour later after taking his medications his blood sugar was 320. Pt's wife did not have any other readings to report to me at this time. Current medications are amaryl 1 mg daily, metformin 500 mg bid and actos 45 mg daily.  Could you review and please advise during Dr. George Hugh absence? Thanks!

## 2015-12-09 NOTE — Telephone Encounter (Signed)
Attempted to reach the pt's wife. She was unavailable. Will try again at a later time.

## 2015-12-09 NOTE — Telephone Encounter (Signed)
I contacted the pt's wife and advised of note below. She voiced understanding and will advise pt. Pt scheduled with Bonita Quin for 1045 am on 12/11/2015.

## 2015-12-11 ENCOUNTER — Other Ambulatory Visit: Payer: Self-pay

## 2015-12-11 ENCOUNTER — Encounter: Payer: 59 | Attending: Endocrinology | Admitting: Nutrition

## 2015-12-11 DIAGNOSIS — Z029 Encounter for administrative examinations, unspecified: Secondary | ICD-10-CM | POA: Insufficient documentation

## 2015-12-11 DIAGNOSIS — E119 Type 2 diabetes mellitus without complications: Secondary | ICD-10-CM

## 2015-12-11 MED ORDER — INSULIN LISPRO PROT & LISPRO (75-25 MIX) 100 UNIT/ML KWIKPEN: PEN_INJECTOR | SUBCUTANEOUS | 4 refills | Status: DC

## 2015-12-11 NOTE — Assessment & Plan Note (Signed)
Patient is here with his wife to learn how to inject insulin.   We discussed how the insulin works, how to take the insulin and what to expect.  He was shown how to use the pen and given a handout on the Humalog Mix pen with directions for use.  He was also given samples of Nano needles to use. Written instructions were given for 10u to be given 5-10 min. Before breakfast and supper.  He was told to test his blood sugars before taking the insulin.   We also reviewed low blood sugars--symptoms and treatments. We reviewed his diet.  He eats no breakfasts, and only 1/2 the time will eat lunch. It was stressed that he needs to eat a little something in the AM as well as some lunch each day.  Normal day: Up at 4AM.  Work at [employer] No snack or nothing to eat until around 1-2PM.  2 days/wk he may eat a 12 inch sub, other days he eats nothing. Discussed the need to eat something in the AM on the way to work, and some lunch each day--to make sure there is protein at each meal.  Suggestions were given for 30 grams of carbohydrate at breakfast and 2 ounces of protein.  He is very active during the morning and early afternoon.  Discussed the need to limit carbs to 1 6inch sub at lunch, and discussed some other options for lunch.   He reported no questions about the insulin and diet suggestions that were given to him. Believe he is in need of further dietary help.  Discussed the need to not drink sweet drinks, fruit juices and cold cereal and milk.  He agreed to do this and had no final questions.

## 2015-12-11 NOTE — Patient Instructions (Signed)
Take 6u of Humalog mix in the AM-10 min. Before breakfast, and 10u before supper. Test blood sugars before breakfast and supper. Always carry some form of hard candy, regular soda or fruit juice in case the blood sugar drops Call Friday, if blood sugars still in the 200s, or drop below 70.

## 2015-12-13 ENCOUNTER — Telehealth: Payer: Self-pay | Admitting: Endocrinology

## 2015-12-13 NOTE — Telephone Encounter (Signed)
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

## 2015-12-13 NOTE — Telephone Encounter (Signed)
Called patient back, he stated he was going to be out of town in two weeks, and has an appointment with Dr.Ellison on the 21st. Advised patient if he had high sugars to go to the ER to be seen until Monday if anything needed to be done. Advised with Alex Millet, LPN about situation.

## 2015-12-13 NOTE — Telephone Encounter (Signed)
I contacted the pt and advised we have received his letter containing his blood sugars. Dr. Lucianne Muss reviewd the blood sugars and stated he would like the pt to be seen. Pt was advised via vm to call and schedule an appointment per Dr. Remus Blake request.

## 2015-12-13 NOTE — Telephone Encounter (Signed)
PT calling about the readings he dropped off earlier, he wants to know if he needs to change anything with his medication.

## 2015-12-13 NOTE — Telephone Encounter (Signed)
Check to see how

## 2015-12-16 ENCOUNTER — Ambulatory Visit (INDEPENDENT_AMBULATORY_CARE_PROVIDER_SITE_OTHER): Payer: 59 | Admitting: Family Medicine

## 2015-12-16 VITALS — BP 106/70 | HR 76 | Temp 97.5°F | Resp 16

## 2015-12-16 DIAGNOSIS — Z794 Long term (current) use of insulin: Secondary | ICD-10-CM

## 2015-12-16 DIAGNOSIS — M545 Low back pain, unspecified: Secondary | ICD-10-CM

## 2015-12-16 DIAGNOSIS — S39012A Strain of muscle, fascia and tendon of lower back, initial encounter: Secondary | ICD-10-CM

## 2015-12-16 DIAGNOSIS — E119 Type 2 diabetes mellitus without complications: Secondary | ICD-10-CM | POA: Diagnosis not present

## 2015-12-16 LAB — BASIC METABOLIC PANEL
BUN: 13 mg/dL (ref 7–25)
CHLORIDE: 102 mmol/L (ref 98–110)
CO2: 24 mmol/L (ref 20–31)
Calcium: 9.6 mg/dL (ref 8.6–10.3)
Creat: 0.84 mg/dL (ref 0.60–1.35)
Glucose, Bld: 191 mg/dL — ABNORMAL HIGH (ref 65–99)
POTASSIUM: 4.9 mmol/L (ref 3.5–5.3)
SODIUM: 137 mmol/L (ref 135–146)

## 2015-12-16 LAB — GLUCOSE, POCT (MANUAL RESULT ENTRY): POC GLUCOSE: 188 mg/dL — AB (ref 70–99)

## 2015-12-16 LAB — POCT URINALYSIS DIP (MANUAL ENTRY)
Bilirubin, UA: NEGATIVE
GLUCOSE UA: NEGATIVE
Ketones, POC UA: NEGATIVE
Leukocytes, UA: NEGATIVE
Nitrite, UA: NEGATIVE
Protein Ur, POC: NEGATIVE
SPEC GRAV UA: 1.015
UROBILINOGEN UA: 0.2
pH, UA: 5.5

## 2015-12-16 LAB — POC MICROSCOPIC URINALYSIS (UMFC): Mucus: ABSENT

## 2015-12-16 MED ORDER — TRAMADOL HCL 50 MG PO TABS: 50.0000 mg | ORAL_TABLET | Freq: Three times a day (TID) | ORAL | 0 refills | Status: DC | PRN

## 2015-12-16 MED ORDER — METHOCARBAMOL 500 MG PO TABS: ORAL_TABLET | ORAL | 0 refills | Status: DC

## 2015-12-16 MED ORDER — DICLOFENAC SODIUM 75 MG PO TBEC: 75.0000 mg | DELAYED_RELEASE_TABLET | Freq: Two times a day (BID) | ORAL | 0 refills | Status: DC

## 2015-12-16 MED ORDER — KETOROLAC TROMETHAMINE 30 MG/ML IJ SOLN
30.0000 mg | Freq: Once | INTRAMUSCULAR | Status: AC
  Administered 2015-12-16: 30 mg via INTRAMUSCULAR

## 2015-12-16 NOTE — Patient Instructions (Signed)
Take diclofenac one twice daily at breakfast and supper for anti-inflammatory pain affect. Do not take ibuprofen or Aleve while you're taking this  Take the muscle relaxant, methocarbamol, 1 in the morning, 1 in the afternoon, and 2 at bedtime  If additional pain relief as needed, you can take tramadol 1 every 6 hours as needed  Use ice or heat or alternate ice and heat for about 15 minutes for 5 times a day  Gentle stretches  Return if worse or not improving  Increase your insulin a small amount as discussed if you are unable to get the advice from your diabetic doctor today   Back Exercises The following exercises strengthen the muscles that help to support the back. They also help to keep the lower back flexible. Doing these exercises can help to prevent back pain or lessen existing pain. If you have back pain or discomfort, try doing these exercises 2-3 times each day or as told by your health care provider. When the pain goes away, do them once each day, but increase the number of times that you repeat the steps for each exercise (do more repetitions). If you do not have back pain or discomfort, do these exercises once each day or as told by your health care provider. EXERCISES Single Knee to Chest Repeat these steps 3-5 times for each leg: 1. Lie on your back on a firm bed or the floor with your legs extended. 2. Bring one knee to your chest. Your other leg should stay extended and in contact with the floor. 3. Hold your knee in place by grabbing your knee or thigh. 4. Pull on your knee until you feel a gentle stretch in your lower back. 5. Hold the stretch for 10-30 seconds. 6. Slowly release and straighten your leg. Pelvic Tilt Repeat these steps 5-10 times: 1. Lie on your back on a firm bed or the floor with your legs extended. 2. Bend your knees so they are pointing toward the ceiling and your feet are flat on the floor. 3. Tighten your lower abdominal muscles to press your  lower back against the floor. This motion will tilt your pelvis so your tailbone points up toward the ceiling instead of pointing to your feet or the floor. 4. With gentle tension and even breathing, hold this position for 5-10 seconds. Cat-Cow Repeat these steps until your lower back becomes more flexible: 1. Get into a hands-and-knees position on a firm surface. Keep your hands under your shoulders, and keep your knees under your hips. You may place padding under your knees for comfort. 2. Let your head hang down, and point your tailbone toward the floor so your lower back becomes rounded like the back of a cat. 3. Hold this position for 5 seconds. 4. Slowly lift your head and point your tailbone up toward the ceiling so your back forms a sagging arch like the back of a cow. 5. Hold this position for 5 seconds. Press-Ups Repeat these steps 5-10 times: 1. Lie on your abdomen (face-down) on the floor. 2. Place your palms near your head, about shoulder-width apart. 3. While you keep your back as relaxed as possible and keep your hips on the floor, slowly straighten your arms to raise the top half of your body and lift your shoulders. Do not use your back muscles to raise your upper torso. You may adjust the placement of your hands to make yourself more comfortable. 4. Hold this position for 5 seconds while  you keep your back relaxed. 5. Slowly return to lying flat on the floor. Bridges Repeat these steps 10 times: 1. Lie on your back on a firm surface. 2. Bend your knees so they are pointing toward the ceiling and your feet are flat on the floor. 3. Tighten your buttocks muscles and lift your buttocks off of the floor until your waist is at almost the same height as your knees. You should feel the muscles working in your buttocks and the back of your thighs. If you do not feel these muscles, slide your feet 1-2 inches farther away from your buttocks. 4. Hold this position for 3-5  seconds. 5. Slowly lower your hips to the starting position, and allow your buttocks muscles to relax completely. If this exercise is too easy, try doing it with your arms crossed over your chest. Abdominal Crunches Repeat these steps 5-10 times: 1. Lie on your back on a firm bed or the floor with your legs extended. 2. Bend your knees so they are pointing toward the ceiling and your feet are flat on the floor. 3. Cross your arms over your chest. 4. Tip your chin slightly toward your chest without bending your neck. 5. Tighten your abdominal muscles and slowly raise your trunk (torso) high enough to lift your shoulder blades a tiny bit off of the floor. Avoid raising your torso higher than that, because it can put too much stress on your low back and it does not help to strengthen your abdominal muscles. 6. Slowly return to your starting position. Back Lifts Repeat these steps 5-10 times: 1. Lie on your abdomen (face-down) with your arms at your sides, and rest your forehead on the floor. 2. Tighten the muscles in your legs and your buttocks. 3. Slowly lift your chest off of the floor while you keep your hips pressed to the floor. Keep the back of your head in line with the curve in your back. Your eyes should be looking at the floor. 4. Hold this position for 3-5 seconds. 5. Slowly return to your starting position. SEEK MEDICAL CARE IF:  Your back pain or discomfort gets much worse when you do an exercise.  Your back pain or discomfort does not lessen within 2 hours after you exercise. If you have any of these problems, stop doing these exercises right away. Do not do them again unless your health care provider says that you can. SEEK IMMEDIATE MEDICAL CARE IF:  You develop sudden, severe back pain. If this happens, stop doing the exercises right away. Do not do them again unless your health care provider says that you can.   This information is not intended to replace advice given to  you by your health care provider. Make sure you discuss any questions you have with your health care provider.   Document Released: 06/04/2004 Document Revised: 01/16/2015 Document Reviewed: 06/21/2014 Elsevier Interactive Patient Education [employer]2016 [employer] Inc[employer]

## 2015-12-16 NOTE — Progress Notes (Signed)
Patient ID: Alex Watson, male    DOB: [birth year]  Age: 50 y.o. MRN: [id number]  No chief complaint on file. Patient is here with problems with back pain.  Subjective:  He cuts wood Saturday with a chainsaw. He did not do splitting of the wound. However that evening and then yesterday he developed more and more back pain in the right low back. It hurts a great deal to move around. He has had a disc problem sometime ago, but he does not describe any radicular pain from this.  He does have diabetes which they have been having trouble controlling. He was placed on insulin last week, Humalog, and is still running blood sugars primarily in the 300s. He has plan to call his diabetic doctor today. He is concerned that the pain could come up from a urinary tract infection.   Current allergies, medications, problem list, past/family and social histories reviewed.  Objective:  There were no vitals taken for this visit.  Obvious distress from his back. Straightening of the lumbar spine. He's tender in the right paraspinous muscles of the low back. Pain on flexion and extension. Stool left-sided tilt is satisfactory but right side tilt also pains. Straight leg raising test essentially negative. Deep tendons symmetrical.   Assessment & Plan:   Assessment: 1. Right-sided low back pain without sciatica   2. Type 2 diabetes mellitus without complication, with long-term current use of insulin (HCC)   3. Back strain, initial encounter       Plan: Check urine. Also did renal function studies to make sure the diabetes has not affected his kidneys.   Orders Placed This Encounter  Procedures  . Basic metabolic panel  . POCT urinalysis dipstick  . POCT Microscopic Urinalysis ([facility])  . POCT glucose (manual entry)    Meds ordered this encounter  Medications  . diclofenac (VOLTAREN) 75 MG EC tablet    Sig: Take 1 tablet (75 mg total) by mouth 2 (two) times daily.    Dispense:  20 tablet    Refill:  0    . traMADol (ULTRAM) 50 MG tablet    Sig: Take 1 tablet (50 mg total) by mouth every 8 (eight) hours as needed.    Dispense:  20 tablet    Refill:  0  . ketorolac (TORADOL) 30 MG/ML injection 30 mg  . methocarbamol (ROBAXIN) 500 MG tablet    Sig: Take 1 in the morning, 1 in the afternoon, and 2 at bedtime for muscle relaxant    Dispense:  40 tablet    Refill:  0   Results for orders placed or performed in visit on 12/16/15  POCT urinalysis dipstick  Result Value Ref Range   Color, UA yellow yellow   Clarity, UA clear clear   Glucose, UA negative negative   Bilirubin, UA negative negative   Ketones, POC UA negative negative   Spec Grav, UA 1.015    Blood, UA trace-intact (A) negative   pH, UA 5.5    Protein Ur, POC negative negative   Urobilinogen, UA 0.2    Nitrite, UA Negative Negative   Leukocytes, UA Negative Negative  POCT Microscopic Urinalysis ([facility])  Result Value Ref Range   WBC,UR,HPF,POC Few (A) None WBC/hpf   RBC,UR,HPF,POC None None RBC/hpf   Bacteria Few (A) None, Too numerous to count   Mucus Absent Absent   Epithelial Cells, UR Per Microscopy None None, Too numerous to count cells/hpf  POCT glucose (manual entry)  Result Value  Ref Range   POC Glucose 188 (A) 70 - 99 mg/dl         Patient Instructions  Take diclofenac one twice daily at breakfast and supper for anti-inflammatory pain affect. Do not take ibuprofen or Aleve while you're taking this  Take the muscle relaxant, methocarbamol, 1 in the morning, 1 in the afternoon, and 2 at bedtime  If additional pain relief as needed, you can take tramadol 1 every 6 hours as needed  Use ice or heat or alternate ice and heat for about 15 minutes for 5 times a day  Gentle stretches  Return if worse or not improving  Increase your insulin a small amount as discussed if you are unable to get the advice from your diabetic doctor today   Back Exercises The following exercises strengthen the muscles that  help to support the back. They also help to keep the lower back flexible. Doing these exercises can help to prevent back pain or lessen existing pain. If you have back pain or discomfort, try doing these exercises 2-3 times each day or as told by your health care provider. When the pain goes away, do them once each day, but increase the number of times that you repeat the steps for each exercise (do more repetitions). If you do not have back pain or discomfort, do these exercises once each day or as told by your health care provider. EXERCISES Single Knee to Chest Repeat these steps 3-5 times for each leg: 1. Lie on your back on a firm bed or the floor with your legs extended. 2. Bring one knee to your chest. Your other leg should stay extended and in contact with the floor. 3. Hold your knee in place by grabbing your knee or thigh. 4. Pull on your knee until you feel a gentle stretch in your lower back. 5. Hold the stretch for 10-30 seconds. 6. Slowly release and straighten your leg. Pelvic Tilt Repeat these steps 5-10 times: 1. Lie on your back on a firm bed or the floor with your legs extended. 2. Bend your knees so they are pointing toward the ceiling and your feet are flat on the floor. 3. Tighten your lower abdominal muscles to press your lower back against the floor. This motion will tilt your pelvis so your tailbone points up toward the ceiling instead of pointing to your feet or the floor. 4. With gentle tension and even breathing, hold this position for 5-10 seconds. Cat-Cow Repeat these steps until your lower back becomes more flexible: 1. Get into a hands-and-knees position on a firm surface. Keep your hands under your shoulders, and keep your knees under your hips. You may place padding under your knees for comfort. 2. Let your head hang down, and point your tailbone toward the floor so your lower back becomes rounded like the back of a cat. 3. Hold this position for 5  seconds. 4. Slowly lift your head and point your tailbone up toward the ceiling so your back forms a sagging arch like the back of a cow. 5. Hold this position for 5 seconds. Press-Ups Repeat these steps 5-10 times: 1. Lie on your abdomen (face-down) on the floor. 2. Place your palms near your head, about shoulder-width apart. 3. While you keep your back as relaxed as possible and keep your hips on the floor, slowly straighten your arms to raise the top half of your body and lift your shoulders. Do not use your back muscles to raise  your upper torso. You may adjust the placement of your hands to make yourself more comfortable. 4. Hold this position for 5 seconds while you keep your back relaxed. 5. Slowly return to lying flat on the floor. Bridges Repeat these steps 10 times: 1. Lie on your back on a firm surface. 2. Bend your knees so they are pointing toward the ceiling and your feet are flat on the floor. 3. Tighten your buttocks muscles and lift your buttocks off of the floor until your waist is at almost the same height as your knees. You should feel the muscles working in your buttocks and the back of your thighs. If you do not feel these muscles, slide your feet 1-2 inches farther away from your buttocks. 4. Hold this position for 3-5 seconds. 5. Slowly lower your hips to the starting position, and allow your buttocks muscles to relax completely. If this exercise is too easy, try doing it with your arms crossed over your chest. Abdominal Crunches Repeat these steps 5-10 times: 1. Lie on your back on a firm bed or the floor with your legs extended. 2. Bend your knees so they are pointing toward the ceiling and your feet are flat on the floor. 3. Cross your arms over your chest. 4. Tip your chin slightly toward your chest without bending your neck. 5. Tighten your abdominal muscles and slowly raise your trunk (torso) high enough to lift your shoulder blades a tiny bit off of the floor.  Avoid raising your torso higher than that, because it can put too much stress on your low back and it does not help to strengthen your abdominal muscles. 6. Slowly return to your starting position. Back Lifts Repeat these steps 5-10 times: 1. Lie on your abdomen (face-down) with your arms at your sides, and rest your forehead on the floor. 2. Tighten the muscles in your legs and your buttocks. 3. Slowly lift your chest off of the floor while you keep your hips pressed to the floor. Keep the back of your head in line with the curve in your back. Your eyes should be looking at the floor. 4. Hold this position for 3-5 seconds. 5. Slowly return to your starting position. SEEK MEDICAL CARE IF:  Your back pain or discomfort gets much worse when you do an exercise.  Your back pain or discomfort does not lessen within 2 hours after you exercise. If you have any of these problems, stop doing these exercises right away. Do not do them again unless your health care provider says that you can. SEEK IMMEDIATE MEDICAL CARE IF:  You develop sudden, severe back pain. If this happens, stop doing the exercises right away. Do not do them again unless your health care provider says that you can.   This information is not intended to replace advice given to you by your health care provider. Make sure you discuss any questions you have with your health care provider.   Document Released: 06/04/2004 Document Revised: 01/16/2015 Document Reviewed: 06/21/2014 Elsevier Interactive Patient Education [employer]       Return if symptoms worsen or fail to improve.   Paola Flynt, MD 12/16/2015

## 2015-12-30 ENCOUNTER — Ambulatory Visit (INDEPENDENT_AMBULATORY_CARE_PROVIDER_SITE_OTHER): Payer: 59 | Admitting: Endocrinology

## 2015-12-30 ENCOUNTER — Encounter: Payer: Self-pay | Admitting: Endocrinology

## 2015-12-30 VITALS — BP 118/70 | HR 83 | Wt 215.0 lb

## 2015-12-30 DIAGNOSIS — E119 Type 2 diabetes mellitus without complications: Secondary | ICD-10-CM

## 2015-12-30 LAB — POCT GLYCOSYLATED HEMOGLOBIN (HGB A1C): Hemoglobin A1C: 10.4

## 2015-12-30 MED ORDER — INSULIN LISPRO PROT & LISPRO (75-25 MIX) 100 UNIT/ML KWIKPEN: 15.0000 [IU] | PEN_INJECTOR | Freq: Two times a day (BID) | SUBCUTANEOUS | 4 refills | Status: DC

## 2015-12-30 NOTE — Patient Instructions (Addendum)
check your blood sugar once a day.  vary the time of day when you check, between before the 3 meals, and at bedtime.  also check if you have symptoms of your blood sugar being too high or too low.  please keep a record of the readings and bring it to your next appointment here.  You can write it on any piece of paper.  please call us sooner if your blood sugar goes below 70.   Please stop the 3 diabetes pills.   Please increase the insulin to 15 units, twice a day, with the first and last meals of the day, no matter what your blood sugar is.   Please come back for a follow-up appointment in 1 month.

## 2015-12-30 NOTE — Progress Notes (Signed)
Subjective:    Patient ID: Alex Watson, male    DOB: 1965/09/15, 50 y.o.   MRN: [id number]  HPI Pt returns for f/u of diabetes mellitus: DM type: he is presumed to be developing type 1, due to lean body habitus.  Dx'ed: 3612 Complications: none.   Therapy: insulin since mid-2017.   DKA: never. Severe hypoglycemia: never.   Pancreatitis: never.  Other: he has never been on insulin; he works out of town; he requests generic meds only.  Interval history: No recent steroids.  no cbg record, but states cbg's vary from 50-400.  He eats 2 meals per day.  He takes 10-15 units, BID.  it is in general higher as the day goes on.   Past Medical History:  Diagnosis Date  . Allergy   . Diabetes mellitus without complication ([place])   . Uncontrolled daytime somnolence 03/07/2013    Past Surgical History:  Procedure Laterality Date  . [place]    Social History   Social History  . Marital status: Married    Spouse name: N/A  . Number of children: N/A  . Years of education: N/A   Occupational History  . Not on file.   Social History Main Topics  . Smoking status: Current Every Day Smoker    Years: 24.00    Types: Cigarettes  . Smokeless tobacco: Not on file  . Alcohol use Yes     Comment: BEER  . Drug use: No  . Sexual activity: Yes     Comment: number of sex partners in the last 12 months 1   Other Topics Concern  . Not on file   Social History Narrative  . No narrative on file    Current Outpatient Prescriptions on File Prior to Visit  Medication Sig Dispense Refill  . blood glucose meter kit and supplies KIT Dispense based on patient and insurance preference. Use up to four times daily as directed. (FOR ICD-9 250.00, 250.01). 1 each 0  . diclofenac (VOLTAREN) 75 MG EC tablet Take 1 tablet (75 mg total) by mouth 2 (two) times daily. 20 tablet 0  . diphenhydramine-acetaminophen (TYLENOL PM) 25-500 MG TABS Take 1 tablet by mouth at bedtime as needed.  Reported on 07/04/2015    . ibuprofen (ADVIL,MOTRIN) 800 MG tablet Take 1 tablet (800 mg total) by mouth 3 (three) times daily. 90 tablet 0  . Lancets 30G MISC Use 1 Lancet 1-3 times per day for glucose check 100 each 1  . methocarbamol (ROBAXIN) 500 MG tablet Take 1 in the morning, 1 in the afternoon, and 2 at bedtime for muscle relaxant 40 tablet 0  . ONE TOUCH ULTRA TEST test strip TEST four times a day 100 each 11  . traMADol (ULTRAM) 50 MG tablet Take 1 tablet (50 mg total) by mouth every 8 (eight) hours as needed. 20 tablet 0   No current facility-administered medications on file prior to visit.     Allergies  Allergen Reactions  . Morphine     REACTION: difficulty breathing    Family History  Problem Relation Age of Onset  . Asthma Mother   . Arthritis Mother   . COPD Father   . Heart disease Sister     eisenberger syndrome  . Lupus Sister   . Diabetes Father     BP 118/70   Pulse 83   Wt 215 lb (97.5 kg)   SpO2 97%   BMI 24.85 kg/m  Review of Systems Denies weight change.      Objective:   Physical Exam VITAL SIGNS:  See vs page GENERAL: no distress Pulses: dorsalis pedis intact bilat.   MSK: no deformity of the feet CV: no leg edema Skin:  no ulcer on the feet.  normal color and temp on the feet. Neuro: sensation is intact to touch on the feet.  Ext: right great toenail is absent.    A1c=10.4%    Assessment & Plan:  Insulin-requiring type 2 DM: he needs increased rx.

## 2016-01-08 ENCOUNTER — Telehealth: Payer: Self-pay | Admitting: Endocrinology

## 2016-01-08 MED ORDER — INSULIN LISPRO 100 UNIT/ML ~~LOC~~ SOLN: SUBCUTANEOUS | 4 refills | Status: DC

## 2016-01-08 MED ORDER — "INSULIN SYRINGE 30G X 1/2"" 1 ML MISC": 2 refills | Status: DC

## 2016-01-08 NOTE — Telephone Encounter (Signed)
I contacted the patients wife and advised prescriptions have been sent.

## 2016-01-08 NOTE — Telephone Encounter (Signed)
Pt works for [employer] and is going to be going to [place] for one month can we call in a rx for needles and vials of the humalog 75/25 called to [facility] on [address]

## 2016-01-09 ENCOUNTER — Other Ambulatory Visit: Payer: Self-pay | Admitting: Physician Assistant

## 2016-01-09 DIAGNOSIS — E118 Type 2 diabetes mellitus with unspecified complications: Secondary | ICD-10-CM

## 2016-01-30 ENCOUNTER — Ambulatory Visit: Payer: Self-pay | Admitting: Endocrinology

## 2016-01-31 ENCOUNTER — Ambulatory Visit (INDEPENDENT_AMBULATORY_CARE_PROVIDER_SITE_OTHER): Payer: 59

## 2016-01-31 ENCOUNTER — Ambulatory Visit: Payer: 59 | Admitting: Physician Assistant

## 2016-01-31 VITALS — BP 110/70 | HR 87 | Temp 98.1°F | Resp 16 | Ht 76.0 in | Wt 201.0 lb

## 2016-01-31 DIAGNOSIS — F17200 Nicotine dependence, unspecified, uncomplicated: Secondary | ICD-10-CM

## 2016-01-31 DIAGNOSIS — M545 Low back pain, unspecified: Secondary | ICD-10-CM

## 2016-01-31 DIAGNOSIS — Z720 Tobacco use: Secondary | ICD-10-CM

## 2016-01-31 MED ORDER — METHOCARBAMOL 500 MG PO TABS: ORAL_TABLET | ORAL | 3 refills | Status: DC

## 2016-01-31 MED ORDER — TRAMADOL HCL 50 MG PO TABS: 50.0000 mg | ORAL_TABLET | Freq: Three times a day (TID) | ORAL | 3 refills | Status: DC | PRN

## 2016-01-31 MED ORDER — DICLOFENAC SODIUM 75 MG PO TBEC: 75.0000 mg | DELAYED_RELEASE_TABLET | Freq: Two times a day (BID) | ORAL | 3 refills | Status: DC

## 2016-01-31 NOTE — Progress Notes (Signed)
01/31/2016 1:41 PM   DOB: [birth year] / MRN: [id number]  SUBJECTIVE:  Alex Watson is a 50 y.o. male presenting for right sided low back pain that started over a month ago.  He saw Dr. Alwyn Ren here and was given an NSAID, muscle relaxor, and tramadol and reports these helped him greatly.  His symptoms remised after a couple of days of treatment and then came back about 1 weeks ago.  States the pain wakes him up at night, and is worse with lying down. He is a current everyday smoker.  Does not complain of weight loss and night sweats. No bowel or bladder incontinence.   He is allergic to morphine.   He  has a past medical history of Allergy; Diabetes mellitus without complication (HCC); and Uncontrolled daytime somnolence (03/07/2013).    He  reports that he has been smoking Cigarettes.  He has smoked for the past 24.00 years. He has never used smokeless tobacco. He reports that he drinks alcohol. He reports that he does not use drugs. He  reports that he currently engages in sexual activity. The patient  has a past surgical history that includes Mandible surgery (1987).  His family history includes Arthritis in his mother; Asthma in his mother; COPD in his father; Diabetes in his father; Heart disease in his sister; Lupus in his sister.  Review of Systems  Constitutional: Negative for chills, fever, malaise/fatigue and weight loss.  Cardiovascular: Negative for chest pain.  Musculoskeletal: Positive for back pain and joint pain.  Skin: Negative for rash.  Neurological: Negative for dizziness, tingling, tremors, sensory change, focal weakness and headaches.  Psychiatric/Behavioral: Negative for depression.    The problem list and medications were reviewed and updated by myself where necessary and exist elsewhere in the encounter.   OBJECTIVE:  BP 110/70   Pulse 87   Temp 98.1 F (36.7 C) (Oral)   Resp 16   Ht 6\' 4"  (1.93 m)   Wt 201 lb (91.2 kg)   SpO2 97%   BMI 24.47 kg/m    Physical Exam  Constitutional: He is oriented to person, place, and time.  Cardiovascular: Normal rate and regular rhythm.   Pulmonary/Chest: Effort normal and breath sounds normal.  Musculoskeletal: Normal range of motion. He exhibits tenderness (about the right lubar paraspinals. ).  Neurological: He is alert and oriented to person, place, and time. He has normal reflexes. He displays normal reflexes. No cranial nerve deficit. He exhibits normal muscle tone. Coordination normal.      No results found for this or any previous visit (from the past 72 hour(s)).  No results found.  ASSESSMENT AND PLAN  [name] was seen today for back pain.  Diagnoses and all orders for this visit:  Right-sided low back pain without sciatica: His rads and negative for fullminant DJD and masses.  Advised he quit smoking. No radicular symptoms and diabetic thus pred not the best option now.  Repeating Dr. Frederik Pear pan as this worked well for him.  -     DG Lumbar Spine Complete; Future -     diclofenac (VOLTAREN) 75 MG EC tablet; Take 1 tablet (75 mg total) by mouth 2 (two) times daily. -     methocarbamol (ROBAXIN) 500 MG tablet; Take 1 in the morning, 1 in the afternoon, and 2 at bedtime for muscle relaxant -     traMADol (ULTRAM) 50 MG tablet; Take 1 tablet (50 mg total) by mouth every 8 (eight) hours as needed.  Smoker: Not ready to quit.    The patient is advised to call or return to clinic if he does not see an improvement in symptoms, or to seek the care of the closest emergency department if he worsens with the above plan.   Deliah BostonMichael Clark, MHS, PA-C [facility] 01/31/2016 1:41 PM

## 2016-01-31 NOTE — Patient Instructions (Signed)
     IF you received an x-ray today, you will receive an invoice from [facility]. Please contact [facility] at [phone] with questions or concerns regarding your invoice.   IF you received labwork today, you will receive an invoice from [facility]. Please contact [facility] at [phone] with questions or concerns regarding your invoice.   Our billing staff will not be able to assist you with questions regarding bills from these companies.  You will be contacted with the lab results as soon as they are available. The fastest way to get your results is to activate your My Chart account. Instructions are located on the last page of this paperwork. If you have not heard from us regarding the results in 2 weeks, please contact this office.      

## 2016-03-16 ENCOUNTER — Encounter: Payer: Self-pay | Admitting: Endocrinology

## 2016-03-16 ENCOUNTER — Ambulatory Visit (INDEPENDENT_AMBULATORY_CARE_PROVIDER_SITE_OTHER): Payer: 59 | Admitting: Endocrinology

## 2016-03-16 VITALS — BP 122/70 | HR 78 | Ht 76.0 in | Wt 197.0 lb

## 2016-03-16 DIAGNOSIS — E119 Type 2 diabetes mellitus without complications: Secondary | ICD-10-CM

## 2016-03-16 LAB — POCT GLYCOSYLATED HEMOGLOBIN (HGB A1C): Hemoglobin A1C: 9.6

## 2016-03-16 MED ORDER — INSULIN LISPRO 100 UNIT/ML ~~LOC~~ SOLN: 15.0000 [IU] | Freq: Every day | SUBCUTANEOUS | 4 refills | Status: DC

## 2016-03-16 MED ORDER — BASAGLAR KWIKPEN 100 UNIT/ML ~~LOC~~ SOPN: 15.0000 [IU] | PEN_INJECTOR | Freq: Every day | SUBCUTANEOUS | 11 refills | Status: DC

## 2016-03-16 NOTE — Patient Instructions (Addendum)
check your blood sugar twice a day.  vary the time of day when you check, between before the 3 meals, and at bedtime.  also check if you have symptoms of your blood sugar being too high or too low.  please keep a record of the readings and bring it to your next appointment here.  You can write it on any piece of paper.  please call us sooner if your blood sugar goes below 70.   Please increase the humalog to 15 units, once a day, with the largest meal of the day, no matter what your blood sugar is.  Also, please add "basaglar," 15 units once a day.   Please come back for a follow-up appointment in 4-6 weeks.

## 2016-03-16 NOTE — Progress Notes (Signed)
Subjective:    Patient ID: Alex Watson, male    DOB: 03-Jun-1965, 50 y.o.   MRN: [id number]  HPI Pt returns for f/u of diabetes mellitus: DM type: he is presumed to be developing type 1, due to lean body habitus.  Dx'ed: 8828 Complications: none.   Therapy: insulin since mid-2017.   DKA: never. Severe hypoglycemia: never.   Pancreatitis: never.  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   Interval history: No recent steroids.  no cbg record, but states cbg's are poorly controlled.  There is no trend throughout the day.  He eats 1 meal per day.  he says constant business travel is compromising his ability to care fore DM.  He says he takes insulin only QD.  Past Medical History:  Diagnosis Date  . Allergy   . Diabetes mellitus without complication ([place])   . Uncontrolled daytime somnolence 03/07/2013    Past Surgical History:  Procedure Laterality Date  . [place]    Social History   Social History  . Marital status: Married    Spouse name: N/A  . Number of children: N/A  . Years of education: N/A   Occupational History  . Not on file.   Social History Main Topics  . Smoking status: Current Every Day Smoker    Years: 24.00    Types: Cigarettes  . Smokeless tobacco: Never Used  . Alcohol use Yes     Comment: BEER  . Drug use: No  . Sexual activity: Yes     Comment: number of sex partners in the last 12 months 1   Other Topics Concern  . Not on file   Social History Narrative  . No narrative on file    Current Outpatient Prescriptions on File Prior to Visit  Medication Sig Dispense Refill  . blood glucose meter kit and supplies KIT Dispense based on patient and insurance preference. Use up to four times daily as directed. (FOR ICD-9 250.00, 250.01). 1 each 0  . diclofenac (VOLTAREN) 75 MG EC tablet Take 1 tablet (75 mg total) by mouth 2 (two) times daily. 20 tablet 3  . ibuprofen (ADVIL,MOTRIN) 800 MG  tablet Take 1 tablet (800 mg total) by mouth 3 (three) times daily. 90 tablet 0  . Insulin Syringe-Needle U-100 (INSULIN SYRINGE 1CC/30GX1/2") 30G X 1/2" 1 ML MISC Use to inject insulin 2 times per day. 200 each 2  . methocarbamol (ROBAXIN) 500 MG tablet Take 1 in the morning, 1 in the afternoon, and 2 at bedtime for muscle relaxant 40 tablet 3  . ONE TOUCH ULTRA TEST test strip TEST four times a day 100 each 11  . ONETOUCH DELICA LANCETS FINE MISC TEST 1-3 TIMES PER DAY FOR GLUCOSE CHECK 300 each 3  . traMADol (ULTRAM) 50 MG tablet Take 1 tablet (50 mg total) by mouth every 8 (eight) hours as needed. 20 tablet 3   No current facility-administered medications on file prior to visit.     Allergies  Allergen Reactions  . Morphine     REACTION: difficulty breathing    Family History  Problem Relation Age of Onset  . Asthma Mother   . Arthritis Mother   . COPD Father   . Heart disease Sister     eisenberger syndrome  . Lupus Sister   . Diabetes Father     BP 122/70   Pulse 78   Ht 6' 4"  (1.93 m)  Wt 197 lb (89.4 kg)   SpO2 97%   BMI 23.98 kg/m    Review of Systems He denies hypoglycemia.      Objective:   Physical Exam VITAL SIGNS:  See vs page GENERAL: no distress Pulses: dorsalis pedis intact bilat.   MSK: no deformity of the feet CV: no leg edema Skin:  no ulcer on the feet.  normal color and temp on the feet. Neuro: sensation is intact to touch on the feet.  Ext: right great toenail is absent.    A1c=9.6%    Assessment & Plan:  Insulin-requiring type 2 DM: worse.  He is evolving type 1 Occupational status: this continues to complicate the rx of DM.  We discussed.  He says he may need to go to [place] soon to work.  We discussed need to check cbg's, and see the pattern throughout the day.    Patient is advised the following: Patient Instructions  check your blood sugar twice a day.  vary the time of day when you check, between before the 3 meals, and at bedtime.   also check if you have symptoms of your blood sugar being too high or too low.  please keep a record of the readings and bring it to your next appointment here.  You can write it on any piece of paper.  please call us sooner if your blood sugar goes below 70.   Please increase the humalog to 15 units, once a day, with the largest meal of the day, no matter what your blood sugar is.  Also, please add "basaglar," 15 units once a day.   Please come back for a follow-up appointment in 4-6 weeks.

## 2016-03-19 ENCOUNTER — Telehealth: Payer: Self-pay | Admitting: Endocrinology

## 2016-03-19 NOTE — Telephone Encounter (Signed)
I contacted the patient's wife and advised of message. She voiced understanding and will advised the patient of the new instructions. She had no further questions at this time.

## 2016-03-19 NOTE — Telephone Encounter (Signed)
Patient ask you to give him a call concerning his b/s

## 2016-03-19 NOTE — Telephone Encounter (Signed)
I am still having trouble controlling my blood sugar #s. Here are my recent numbers: 11/9 5:39am 260 11/8 8:01pm 256  6:10pm 337 (1 hour after dinner) 3:28pm 361 7:08am 245 11/7 9:25pm 152 ( 1 1/2 hrs after dinner) 5:22pm 236  10:26am 336 (3 hrs after first dose of time released insulin) I take the time released insulin in the mornings and the humulog at dinner. What can I do to lower these numbers? Please call me at [phone] if need be. thank you

## 2016-03-19 NOTE — Telephone Encounter (Signed)
Please continue the same humalog: 15 units, once a day, with the largest meal of the day Also, please increase basaglar to 20 units once a day.  Keep calling if it is high.  We are happy to hear from you.

## 2016-03-19 NOTE — Telephone Encounter (Signed)
Please advise 

## 2016-03-24 ENCOUNTER — Telehealth: Payer: Self-pay

## 2016-03-24 ENCOUNTER — Other Ambulatory Visit: Payer: Self-pay

## 2016-03-24 NOTE — Telephone Encounter (Signed)
I contacted the patient's wife and advised of message. She voiced understanding and had no further questions at this time.  

## 2016-03-24 NOTE — Telephone Encounter (Signed)
please call patient: The insulin will work.  We just need the right amount.  Your diabetes is transitioning to the type that needs insulin. Please continue the same humalog: 15 units, once a day, with the largest meal of the day Also, please increase basaglar to 30 units once a day.

## 2016-03-24 NOTE — Telephone Encounter (Signed)
Patient called you report his blood sugar readings. Patient stated his fasting blood sugar readings are running in the 250s. Patient stated through out the day his blood sugar will be in the 300s. Today at 11 am his blood sugar reading 312. Patient confirmed he is taking 20 units of Lantus in the am and 15 units of Humalog with his largest meal. Please advise on how to proceed patient stated he does not believe the long acting insulin is working at this time for him.

## 2016-03-27 ENCOUNTER — Telehealth: Payer: Self-pay

## 2016-03-27 NOTE — Telephone Encounter (Signed)
Patient called to report his blood sugar readings 03/25/2016 830 pm : 70  03/26/2016 Fasting: 320  4 pm: 108 8 pm: 65  03/27/2016: Fasting 392  Patient is currently taking 15 units of humalog at his largest meal and 30 units of basaglar in the morning daily.

## 2016-03-27 NOTE — Telephone Encounter (Signed)
Please verify humalog is 15 units, once a day, with the evening meal And basaglar is 30units once a day.  Then please: Please reduce the humalog to 10 units, once a day, with supper.   Also, please increase basaglar to 35units once a day.    Please continue to call with cbg's

## 2016-03-27 NOTE — Telephone Encounter (Signed)
I contacted the patient and advised of message. Patient was currently taking 15 units of humalog once a day and 30 units of basaglar. Patient was advised of new instructions and verbalized understanding. Pateint had no further questions at this time.

## 2016-04-03 ENCOUNTER — Other Ambulatory Visit: Payer: Self-pay | Admitting: Physician Assistant

## 2016-04-08 ENCOUNTER — Telehealth: Payer: Self-pay | Admitting: Endocrinology

## 2016-04-08 MED ORDER — BASAGLAR KWIKPEN 100 UNIT/ML ~~LOC~~ SOPN: 35.0000 [IU] | PEN_INJECTOR | Freq: Every day | SUBCUTANEOUS | 5 refills | Status: DC

## 2016-04-08 NOTE — Telephone Encounter (Signed)
[facility] on [address] needs refills on the lantus 35 u daily

## 2016-04-08 NOTE — Telephone Encounter (Signed)
Refill for the basaglar submitted to the pharmacy.

## 2016-05-14 ENCOUNTER — Ambulatory Visit: Payer: Self-pay | Admitting: Endocrinology

## 2016-07-29 ENCOUNTER — Ambulatory Visit (INDEPENDENT_AMBULATORY_CARE_PROVIDER_SITE_OTHER): Payer: Managed Care, Other (non HMO) | Admitting: Endocrinology

## 2016-07-29 ENCOUNTER — Encounter: Payer: Self-pay | Admitting: Endocrinology

## 2016-07-29 VITALS — BP 110/72 | HR 89 | Ht 76.0 in | Wt 213.0 lb

## 2016-07-29 DIAGNOSIS — E119 Type 2 diabetes mellitus without complications: Secondary | ICD-10-CM

## 2016-07-29 LAB — POCT GLYCOSYLATED HEMOGLOBIN (HGB A1C): HEMOGLOBIN A1C: 8.3

## 2016-07-29 MED ORDER — INSULIN ASPART 100 UNIT/ML ~~LOC~~ SOLN: 10.0000 [IU] | Freq: Every day | SUBCUTANEOUS | 11 refills | Status: DC

## 2016-07-29 NOTE — Progress Notes (Signed)
Subjective:    Patient ID: Alex Watson, male    DOB: [birth year], 51 y.o.   MRN: [id number]  HPI Pt returns for f/u of diabetes mellitus: DM type: he is presumed to be developing type 1, due to lean body habitus.  Dx'ed: 7494 Complications: none.   Therapy: insulin since mid-2017.   DKA: never. Severe hypoglycemia: never.   Pancreatitis: never.  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  Interval history: no cbg record, but states cbg's vary from 49-400.  He says highest and lowest cbg's are both after supper.   Past Medical History:  Diagnosis Date  . Allergy   . Diabetes mellitus without complication ([place])   . Uncontrolled daytime somnolence 03/07/2013    Past Surgical History:  Procedure Laterality Date  . [place]    Social History   Social History  . Marital status: Married    Spouse name: N/A  . Number of children: N/A  . Years of education: N/A   Occupational History  . Not on file.   Social History Main Topics  . Smoking status: Current Every Day Smoker    Years: 24.00    Types: Cigarettes  . Smokeless tobacco: Never Used  . Alcohol use Yes     Comment: BEER  . Drug use: No  . Sexual activity: Yes     Comment: number of sex partners in the last 12 months 1   Other Topics Concern  . Not on file   Social History Narrative  . No narrative on file    Current Outpatient Prescriptions on File Prior to Visit  Medication Sig Dispense Refill  . blood glucose meter kit and supplies KIT Dispense based on patient and insurance preference. Use up to four times daily as directed. (FOR ICD-9 250.00, 250.01). 1 each 0  . diclofenac (VOLTAREN) 75 MG EC tablet Take 1 tablet (75 mg total) by mouth 2 (two) times daily. 20 tablet 3  . ibuprofen (ADVIL,MOTRIN) 800 MG tablet Take 1 tablet (800 mg total) by mouth 3 (three) times daily. 90 tablet 0  . Insulin Glargine  (BASAGLAR KWIKPEN) 100 UNIT/ML SOPN Inject 0.35 mLs (35 Units total) into the skin daily. And pen needles 1/day 15 mL 5  . Insulin Syringe-Needle U-100 (INSULIN SYRINGE 1CC/30GX1/2") 30G X 1/2" 1 ML MISC Use to inject insulin 2 times per day. 200 each 2  . methocarbamol (ROBAXIN) 500 MG tablet Take 1 in the morning, 1 in the afternoon, and 2 at bedtime for muscle relaxant 40 tablet 3  . ONE TOUCH ULTRA TEST test strip TEST four times a day 100 each 0  . ONETOUCH DELICA LANCETS FINE MISC TEST 1-3 TIMES PER DAY FOR GLUCOSE CHECK 300 each 3  . traMADol (ULTRAM) 50 MG tablet Take 1 tablet (50 mg total) by mouth every 8 (eight) hours as needed. 20 tablet 3   No current facility-administered medications on file prior to visit.     Allergies  Allergen Reactions  . Morphine     REACTION: difficulty breathing    Family History  Problem Relation Age of Onset  . Asthma Mother   . Arthritis Mother   . COPD Father   . Heart disease Sister     eisenberger syndrome  . Lupus Sister   . Diabetes Father     BP 110/72   Pulse 89   Ht  6' 4"  (1.93 m)   Wt 213 lb (96.6 kg)   SpO2 94%   BMI 25.93 kg/m   Review of Systems Denies LOC.      Objective:   Physical Exam VITAL SIGNS:  See vs page GENERAL: no distress Pulses: dorsalis pedis intact bilat.   MSK: no deformity of the feet CV: no leg edema Skin:  no ulcer on the feet.  normal color and temp on the feet.  Neuro: sensation is intact to touch on the feet.  Ext: right great toenail is absent.   a1c=8.3%    Assessment & Plan:  Type 1 DM: he needs increased rx.   Patient is advised the following: Patient Instructions  check your blood sugar twice a day.  vary the time of day when you check, between before the 3 meals, and at bedtime.  also check if you have symptoms of your blood sugar being too high or too low.  please keep a record of the readings and bring it to your next appointment here.  You can write it on any piece of paper.   please call us sooner if your blood sugar goes below 70.   Please increase the novolog to 10-15 units, once a day, with supper.   Please continue the same basaglar.   Please come back for a follow-up appointment in 3-4 months.

## 2016-07-29 NOTE — Patient Instructions (Addendum)
check your blood sugar twice a day.  vary the time of day when you check, between before the 3 meals, and at bedtime.  also check if you have symptoms of your blood sugar being too high or too low.  please keep a record of the readings and bring it to your next appointment here.  You can write it on any piece of paper.  please call us sooner if your blood sugar goes below 70.   Please increase the novolog to 10-15 units, once a day, with supper.   Please continue the same basaglar.   Please come back for a follow-up appointment in 3-4 months.

## 2016-11-27 ENCOUNTER — Ambulatory Visit: Payer: Self-pay | Admitting: Endocrinology

## 2016-11-30 ENCOUNTER — Telehealth: Payer: Self-pay | Admitting: Endocrinology

## 2016-11-30 NOTE — Telephone Encounter (Signed)
basaglar injection is ok.   Also needs f/u ov next available.

## 2016-11-30 NOTE — Telephone Encounter (Signed)
[facility] - [address] [place], [place]>> Scripts covered are PsychiatristLevemir Flextouch, IT sales professionalBasaglar Injection, and [employer] Flextouch.  Please resend a script that the insurance will cover.  Thank you,  -LL

## 2016-12-01 ENCOUNTER — Other Ambulatory Visit: Payer: Self-pay

## 2016-12-01 MED ORDER — BASAGLAR KWIKPEN 100 UNIT/ML ~~LOC~~ SOPN: 35.0000 [IU] | PEN_INJECTOR | Freq: Every day | SUBCUTANEOUS | 5 refills | Status: DC

## 2016-12-01 NOTE — Telephone Encounter (Signed)
Called patient and also sent mychart message to see what pharmacy we should send prescription to.

## 2016-12-07 NOTE — Telephone Encounter (Signed)
Pt is only here for this week. He needs to be seen by someone to get his meds. Would one of you be able to work him in or no?

## 2016-12-09 ENCOUNTER — Encounter: Payer: Self-pay | Admitting: Endocrinology

## 2016-12-09 ENCOUNTER — Ambulatory Visit (INDEPENDENT_AMBULATORY_CARE_PROVIDER_SITE_OTHER): Payer: Managed Care, Other (non HMO) | Admitting: Endocrinology

## 2016-12-09 ENCOUNTER — Telehealth: Payer: Self-pay | Admitting: Endocrinology

## 2016-12-09 ENCOUNTER — Other Ambulatory Visit: Payer: Self-pay

## 2016-12-09 VITALS — BP 132/84 | HR 88 | Ht 76.0 in | Wt 206.2 lb

## 2016-12-09 DIAGNOSIS — E1065 Type 1 diabetes mellitus with hyperglycemia: Secondary | ICD-10-CM | POA: Diagnosis not present

## 2016-12-09 LAB — BASIC METABOLIC PANEL
BUN: 15 mg/dL (ref 6–23)
CHLORIDE: 98 meq/L (ref 96–112)
CO2: 28 mEq/L (ref 19–32)
Calcium: 9.9 mg/dL (ref 8.4–10.5)
Creatinine, Ser: 1.25 mg/dL (ref 0.40–1.50)
GFR: 65.19 mL/min (ref >60.00)
Glucose, Bld: 438 mg/dL — ABNORMAL HIGH (ref 70–99)
POTASSIUM: 4.2 meq/L (ref 3.5–5.1)
Sodium: 133 mEq/L — ABNORMAL LOW (ref 135–145)

## 2016-12-09 LAB — LIPID PANEL
CHOLESTEROL: 193 mg/dL (ref 0–200)
HDL: 38.7 mg/dL — ABNORMAL LOW (ref >39.00)
LDL CALC: 129 mg/dL — AB (ref 0–99)
NonHDL: 154.28
Total CHOL/HDL Ratio: 5
Triglycerides: 128 mg/dL (ref 0.0–149.0)
VLDL: 25.6 mg/dL (ref 0.0–40.0)

## 2016-12-09 LAB — MICROALBUMIN / CREATININE URINE RATIO
CREATININE, U: 47.2 mg/dL
MICROALB UR: 0.1 mg/dL (ref 0.0–1.9)
MICROALB/CREAT RATIO: 0.2 mg/g (ref 0.0–30.0)

## 2016-12-09 LAB — POCT GLYCOSYLATED HEMOGLOBIN (HGB A1C): HEMOGLOBIN A1C: 9.4

## 2016-12-09 MED ORDER — ONETOUCH DELICA LANCETS FINE MISC: 3 refills | Status: DC

## 2016-12-09 MED ORDER — GLUCOSE BLOOD VI STRP: ORAL_STRIP | 0 refills | Status: DC

## 2016-12-09 MED ORDER — FREESTYLE LIBRE READER DEVI: 1.0000 | 0 refills | Status: DC

## 2016-12-09 MED ORDER — "INSULIN SYRINGE 30G X 1/2"" 1 ML MISC": 2 refills | Status: DC

## 2016-12-09 MED ORDER — FREESTYLE LIBRE SENSOR SYSTEM MISC: 3 refills | Status: DC

## 2016-12-09 NOTE — Patient Instructions (Signed)
GLUCOSE monitoring:  Check blood sugars on waking up  at least every other day  Also check blood sugars about 2 hours after a meal and do this after different meals by rotation  Recommended blood sugar levels on waking up is 90-130 and about 2 hours after meal is 130-160  Please bring your blood sugar monitor to each visit, thank you  BASAGLAR insulin:  Start taking 15 units in the morning on waking up and take 20 units about 12 hours later At least every week adjust your EVENING dose by 2 units up or down to keep morning sugars in the above range  Also if the blood sugars in the afternoon start being consistently out of range may adjust the morning Basaglar also  NOVOLOG insulin: Keep adjusting his based on the carbohydrate intake and keep to readings 2 hours after eating in the above range Try dividing the grams of carbohydrates by 5 and extra 3-5 units for higher fat meals

## 2016-12-09 NOTE — Progress Notes (Signed)
Patient ID: Alex Watson, male   DOB: July 20, 1965, 51 y.o.   MRN: [id number]    Chief complaint : Follow up of Type 1 Diabetes  History of Present Illness:          Date of diagnosis: 2016       Prior history:   He apparently has been on insulin since about mid 2017 His A1c appears to be persistently high Previously on Lantus and more recently on Basaglar  INSULIN regimen: Basaglar 35 units in the morning.  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, agricultural Has not had any A1c since March when it was 8.3, it is now 9.4  Daily routine: He says he is getting up at 3 AM to go to work and is taking his insulin in the morning He does not eat breakfast or lunch except on his days off when he may eat lunch Mostly eating 1 meal in the evening around 6 PM and he will take his NovoLog at that time He is checking his blood sugars less than once a day on his One Touch monitor  Insulin regimen: He is taking 10 units of Novolog when he is eating lighter meal with less carbohydrate and 15 units otherwise He has always been taking his Basaglar once a day in the morning  Glucose patterns:  Fasting glucose: Checked only rarely, has only one reading around 7 AM of 354  Postprandial glucoses not checked at all  Blood sugars midday and early afternoon range from 69-257  Hyperglycemia: Some days his blood sugars are high midday but usually not after 2 PM  Hypoglycemia: This may occur during the day specially in the afternoon based on his activity level.  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  He does not have any snacks or drink any sweet drinks unless his blood sugar is symptomatically low     Glucose monitoring:  done  0.6 times a day         Glucometer: One Touch.      Blood Glucose reading summary from meter download:   PREMEAL Breakfast Lunch Dinner Bedtime Overall  Glucose range: 354  1 11-257      Median:  183    151    POST-MEAL 10 AM  2-4 PM  PC Dinner  Glucose range:  69-1 86    Median:  101      Meal times: 6 pm.         Most recent dietitian/nurse educator visit   2017         Diabetes labs:  Lab Results  Component Value Date   HGBA1C 9.4 12/09/2016   HGBA1C 8.3 07/29/2016   HGBA1C 9.6 03/16/2016   Lab Results  Component Value Date   MICROALBUR 0.1 12/09/2016   LDLCALC 129 (H) 12/09/2016   CREATININE 1.25 12/09/2016    Office Visit on 12/09/2016  Component Date Value Ref Range Status  . Hemoglobin A1C 12/09/2016 9.4   Final  . Sodium 12/09/2016 133* 135 - 145 mEq/L Final  . Potassium 12/09/2016 4.2  3.5 - 5.1 mEq/L Final  . Chloride 12/09/2016 98  96 - 112  mEq/L Final  . CO2 12/09/2016 28  19 - 32 mEq/L Final  . Glucose, Bld 12/09/2016 438* 70 - 99 mg/dL Final  . BUN 12/09/2016 15  6 - 23 mg/dL Final  . Creatinine, Ser 12/09/2016 1.25  0.40 - 1.50 mg/dL Final  . Calcium 12/09/2016 9.9  8.4 - 10.5 mg/dL Final  . GFR 12/09/2016 65.19  >60.00 mL/min Final  . Microalb, Ur 12/09/2016 0.1  0.0 - 1.9 mg/dL Final  . Creatinine,U 12/09/2016 47.2  mg/dL Final  . Microalb Creat Ratio 12/09/2016 0.2  0.0 - 30.0 mg/g Final  . Cholesterol 12/09/2016 193  0 - 200 mg/dL Final   ATP III Classification       Desirable:  < 200 mg/dL               Borderline High:  200 - 239 mg/dL          High:  > = 240 mg/dL  . Triglycerides 12/09/2016 128.0  0.0 - 149.0 mg/dL Final   Normal:  <150 mg/dLBorderline High:  150 - 199 mg/dL  . HDL 12/09/2016 38.70* >39.00 mg/dL Final  . VLDL 12/09/2016 25.6  0.0 - 40.0 mg/dL Final  . LDL Cholesterol 12/09/2016 129* 0 - 99 mg/dL Final  . Total CHOL/HDL Ratio 12/09/2016 5   Final                  Men          Women1/2 Average Risk     3.4          3.3Average  Risk          5.0          4.42X Average Risk          9.6          7.13X Average Risk          15.0          11.0                      . NonHDL 12/09/2016 154.28   Final   NOTE:  Non-HDL goal should be 30 mg/dL higher than patient's LDL goal (i.e. LDL goal of < 70 mg/dL, would have non-HDL goal of < 100 mg/dL)    Allergies as of 12/09/2016      Reactions   Morphine    REACTION: difficulty breathing      Medication List       Accurate as of 12/09/16  4:40 PM. Always use your most recent med list.          BASAGLAR KWIKPEN 100 UNIT/ML Sopn Inject 0.35 mLs (35 Units total) into the skin daily. And pen needles 1/day   blood glucose meter kit and supplies Kit Dispense based on patient and insurance preference. Use up to four times daily as directed. (FOR ICD-9 250.00, 250.01).   diclofenac 75 MG EC tablet Commonly known as:  VOLTAREN Take 1 tablet (75 mg total) by mouth 2 (two) times daily.   FREESTYLE LIBRE READER Devi 1 Device by Does not apply route as directed.   [place] Misc Apply to upper arm and change sensor every 10 days   glucose blood test strip Commonly known as:  ONE TOUCH ULTRA TEST TEST four times a day   ibuprofen 800 MG tablet Commonly known as:  ADVIL,MOTRIN Take 1 tablet (800 mg total) by mouth 3 (three) times daily.  insulin aspart 100 UNIT/ML injection Commonly known as:  NOVOLOG Inject 10-15 Units into the skin daily with supper. And syringes 1/day   INSULIN SYRINGE 1CC/30GX1/2" 30G X 1/2" 1 ML Misc Use to inject insulin 2 times per day.   methocarbamol 500 MG tablet Commonly known as:  ROBAXIN Take 1 in the morning, 1 in the afternoon, and 2 at bedtime for muscle relaxant   ONETOUCH DELICA LANCETS FINE Misc TEST 1-4 TIMES PER DAY FOR GLUCOSE CHECK   traMADol 50 MG tablet Commonly known as:  ULTRAM Take 1 tablet (50 mg total) by mouth every 8 (eight) hours as needed.       Allergies:  Allergies  Allergen Reactions   . Morphine     REACTION: difficulty breathing    Past Medical History:  Diagnosis Date  . Allergy   . Diabetes mellitus without complication ([place])   . Uncontrolled daytime somnolence 03/07/2013    Past Surgical History:  Procedure Laterality Date  . MANDIBLE SURGERY  1987    Family History  Problem Relation Age of Onset  . Asthma Mother   . Arthritis Mother   . COPD Father   . Heart disease Sister        eisenberger syndrome  . Lupus Sister   . Diabetes Father     Social History:  reports that he has been smoking Cigarettes.  He has smoked for the past 24.00 years. He has never used smokeless tobacco. He reports that he drinks alcohol. He reports that he does not use drugs.    Review of Systems:   Blood pressure:Has been normal  Lipids: Needs updating of levels, never on treatment  Lab Results  Component Value Date   CHOL 193 12/09/2016   HDL 38.70 (L) 12/09/2016   LDLCALC 129 (H) 12/09/2016   TRIG 128.0 12/09/2016   CHOLHDL 5 12/09/2016     Diabetes complications: No history of neuropathy   Physical Examination:  BP 132/84   Pulse 88   Ht 6' 4"  (1.93 m)   Wt 206 lb 3.2 oz (93.5 kg)   SpO2 96%   BMI 25.10 kg/m   No pedal edema No lesions on the plantar surface of the feet      ASSESSMENT/PLAN   Diabetes type 1 with consistently poor control   Problems identified:  Likely has inadequate duration of action of Basaglar with what appears to be significantly high fasting readings  Tendency to hypoglycemia during the day when he is more active and he is not keeping up with carbohydrate snacks to prevent hypoglycemia when more active  Unknown postprandial readings after his main meal in the evening  Adjusting suppertime dose very arbitrarily without looking at carbohydrate content, needs to know about it counting  Overall inadequate glucose monitoring                     RECOMMENDATIONS:  Discussed continuous glucose monitoring with the  freestyle Libre sensor and showed him how this would be used, given patient education material on this Prescription has been sent  He will use Basaglar twice a day with larger doses in the evening Use carbohydrate counting to cover his evening meals, will need the help of nurse educator get started Consistent glucose monitoring at various times as discussed  General care: He has not had any lipid panel or a urine microalbumin and this will be done today  Details as below  Patient Instructions  GLUCOSE monitoring:  Check blood  sugars on waking up  at least every other day  Also check blood sugars about 2 hours after a meal and do this after different meals by rotation  Recommended blood sugar levels on waking up is 90-130 and about 2 hours after meal is 130-160  Please bring your blood sugar monitor to each visit, thank you  BASAGLAR insulin:  Start taking 15 units in the morning on waking up and take 20 units about 12 hours later At least every week adjust your EVENING dose by 2 units up or down to keep morning sugars in the above range  Also if the blood sugars in the afternoon start being consistently out of range may adjust the morning Basaglar also  NOVOLOG insulin: Keep adjusting his based on the carbohydrate intake and keep to readings 2 hours after eating in the above range Try dividing the grams of carbohydrates by 5 and extra 3-5 units for higher fat meals       Counseling time on subjects discussed in assessment and plan sections is over 50% of today's 25 minute visit   Aviyon Hocevar 12/09/2016, 4:40 PM

## 2016-12-09 NOTE — Telephone Encounter (Signed)
He is seeing Dr. Lucianne MussKumar today at 3.

## 2016-12-09 NOTE — Telephone Encounter (Signed)
I can see him if there is an opening.  He will need to bring his blood sugar monitor with him

## 2016-12-09 NOTE — Telephone Encounter (Signed)
Patient's wife calling in to see if anyone can see him while he is in town until Wednesday 12/16/16 at the latest. Patient needs to be seen to get Rx for Insulin Glargine (BASAGLAR KWIKPEN) 100 UNIT/ML SOPN filled. Please call patient and advise. OK to leave message.

## 2016-12-11 ENCOUNTER — Other Ambulatory Visit: Payer: Self-pay | Admitting: Endocrinology

## 2016-12-11 DIAGNOSIS — Z794 Long term (current) use of insulin: Principal | ICD-10-CM

## 2016-12-11 DIAGNOSIS — E119 Type 2 diabetes mellitus without complications: Secondary | ICD-10-CM

## 2016-12-14 ENCOUNTER — Encounter: Payer: Managed Care, Other (non HMO) | Attending: Endocrinology | Admitting: Nutrition

## 2016-12-14 DIAGNOSIS — Z713 Dietary counseling and surveillance: Secondary | ICD-10-CM | POA: Insufficient documentation

## 2016-12-14 DIAGNOSIS — E119 Type 2 diabetes mellitus without complications: Secondary | ICD-10-CM

## 2016-12-14 DIAGNOSIS — Z794 Long term (current) use of insulin: Secondary | ICD-10-CM | POA: Insufficient documentation

## 2016-12-25 ENCOUNTER — Telehealth: Payer: Self-pay | Admitting: Endocrinology

## 2016-12-25 NOTE — Telephone Encounter (Signed)
Patient wanted to know what dose of humalog? Night time & morning his blood sugars are spiking even with out eating. He is doing the basaglar 24 units evenings & 20 units daytime. He does the 10-15 units of humalog between 7-9pm with his big daily meal.

## 2016-12-25 NOTE — Telephone Encounter (Signed)
Patient is having difficulty regulating his blood sugar on the Insulin Glargine (BASAGLAR KWIKPEN) 100 UNIT/ML SOPN.  Patient's blood sugar was 116 this morning before breakfast and then the last time he checked it was 246 shortly after.   Patient's blood sugar has gotten up to 436 in the past and as low as 110.  Call patient to advise.

## 2016-12-25 NOTE — Telephone Encounter (Signed)
Please advise 

## 2016-12-25 NOTE — Telephone Encounter (Signed)
Please reduce basaglar to 15 units qhs, and: Take humalog 5 units with breakfast, 5 units with lunch, and 10-15 units with supper

## 2016-12-25 NOTE — Telephone Encounter (Signed)
Notified patient of new dosages & he said that he would try them. He will call back if blood sugars continue to be high especially at night.

## 2016-12-25 NOTE — Telephone Encounter (Signed)
The way to address this would be to take novolog with each meal, and reduce the basaglar.

## 2017-01-02 NOTE — Progress Notes (Signed)
Pt. Is here with his wife to discuss blood sugars /insulin doses.  He is very discouraged at blood sugar readings.  He is testing ac B and then . To 1 1/2 hours after eating.  FBSs are 110-300, and . Readings vary, depending on meal size.   Insulin dose:  Basaglar:  15 AM, (8AM), and 20 (9PM)  He has not increased his evening dose as yet, despite higher readings over 130    Novolog: 20u acS.  Not adjusting this dose as well. Discussed how each insulin works,and the fact that his body is now producing less insulin on his own.   Discussed what carbs are,and fact that he must take Novolog based on how much carb he is eating.  Breakfast is cereal at times,and other times, it is 1 piece of toast.  Discussed how this would affect the blood sugar reading.  Same applies to supper meal.  Discussed how he can adjust his Novolog by 1-3u based on the amount of carbs eaten.  He did not want to count carbs, but wife is willing to do this.  Booklet given on carb counting, but sense that he is able to keep carbs consisitant based on having 60 grams (12u ) for this meal, and increasing 2u when eating pizza, or pasta.  He agreed to do this. He was told to not test his blood sugar after meals until 2 hours after finishing the meal, to prevent variablity, and stress.  Goals given for 2hour readings, were less than 180, with knowledge that if over that, he will need to increase the dose of Novolog when eating that same meal by 1-2u.  He was told to keep a record of his 2hr. pc readings and write down what was eaten at the meal, when the reading was over 180, to remember to take more Novolog the next time he eats this.  He agreed to do this.

## 2017-01-26 IMAGING — DX DG SHOULDER 2+V*R*
2 series · 2 of 2 positions shown · non-contrast
Comparison: None.

CLINICAL DATA: Pain over both shoulders as well as the left hand.

Pt was 8 ft on top of ladder- fell. Unknown cause of fall. Pt lost
consciousness. Pt woke up within approx 1 min. Pt complaining of
pain in his head, bilateral shoulder pain, left pink deformity per
EMS. Pt alert to self, place- not oriented to time and event.
EXAM:
RIGHT SHOULDER - 2+ VIEW

[shoulder grashey]
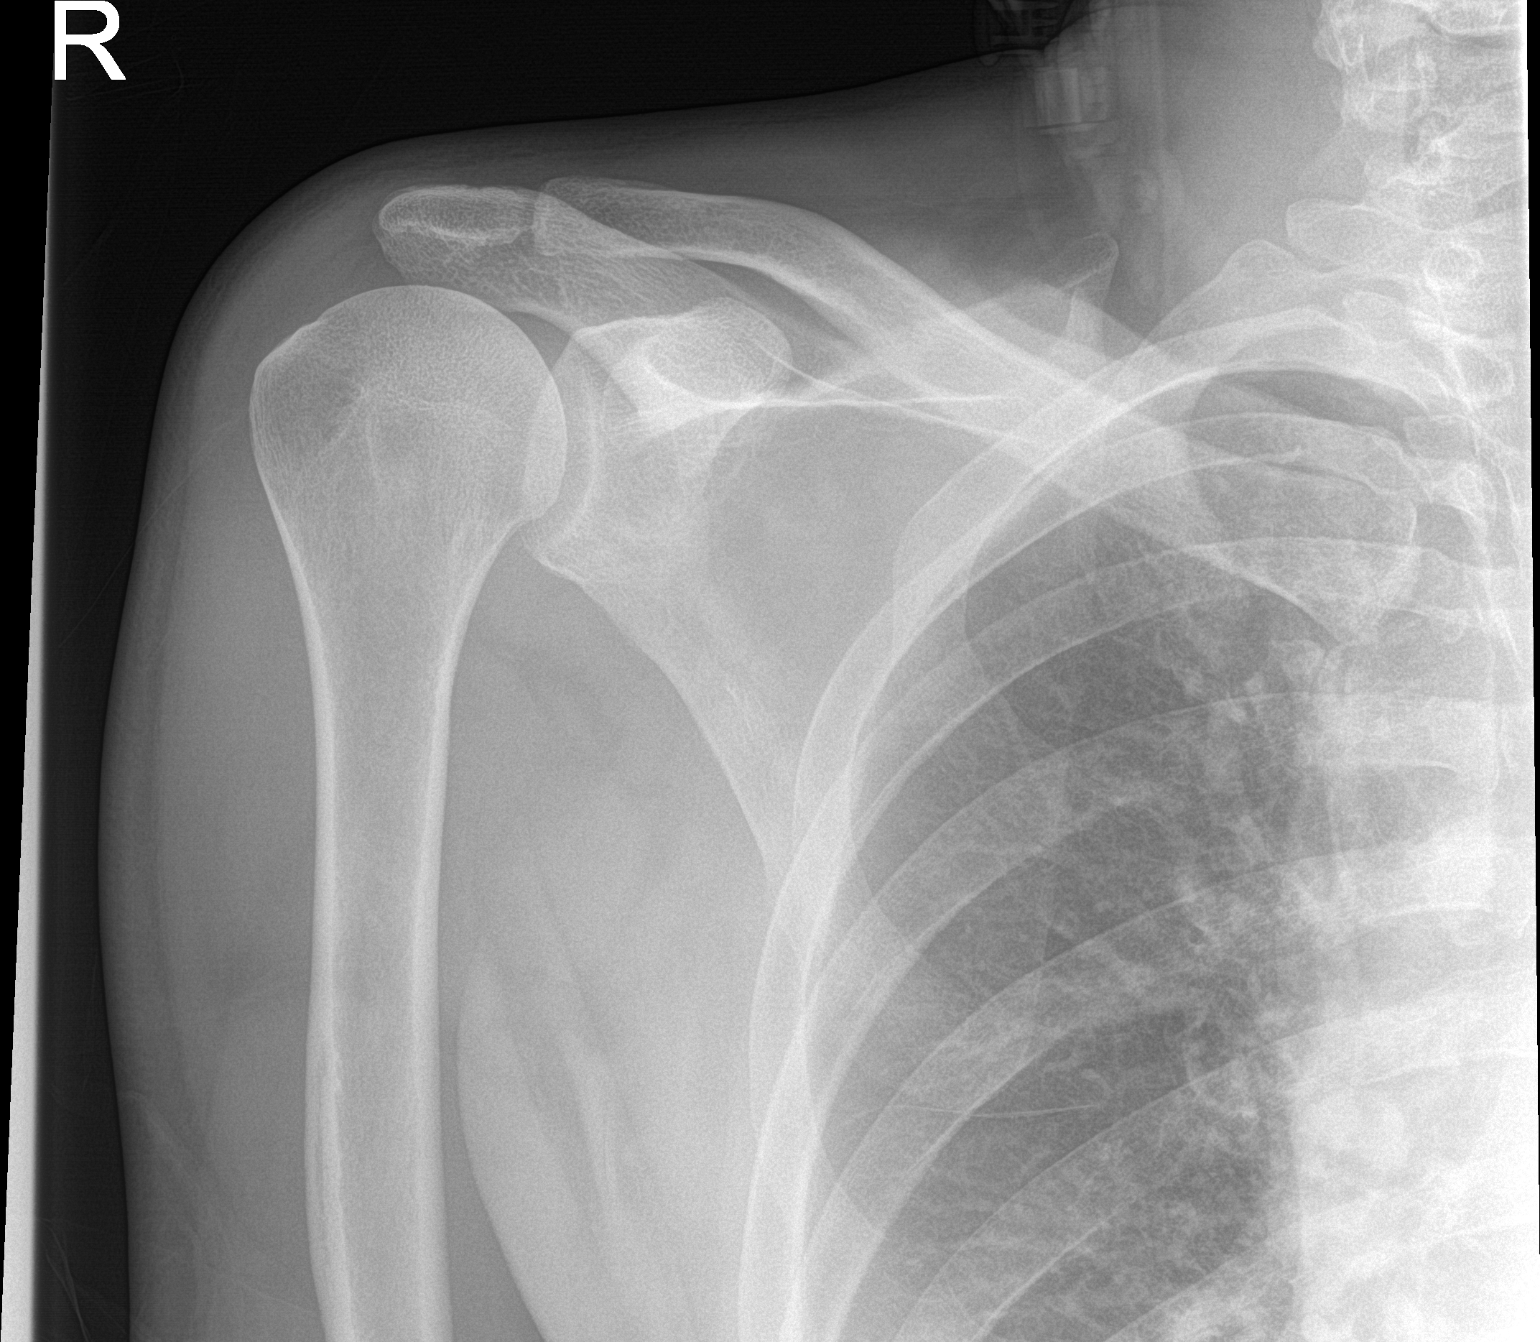

[shoulder y view]
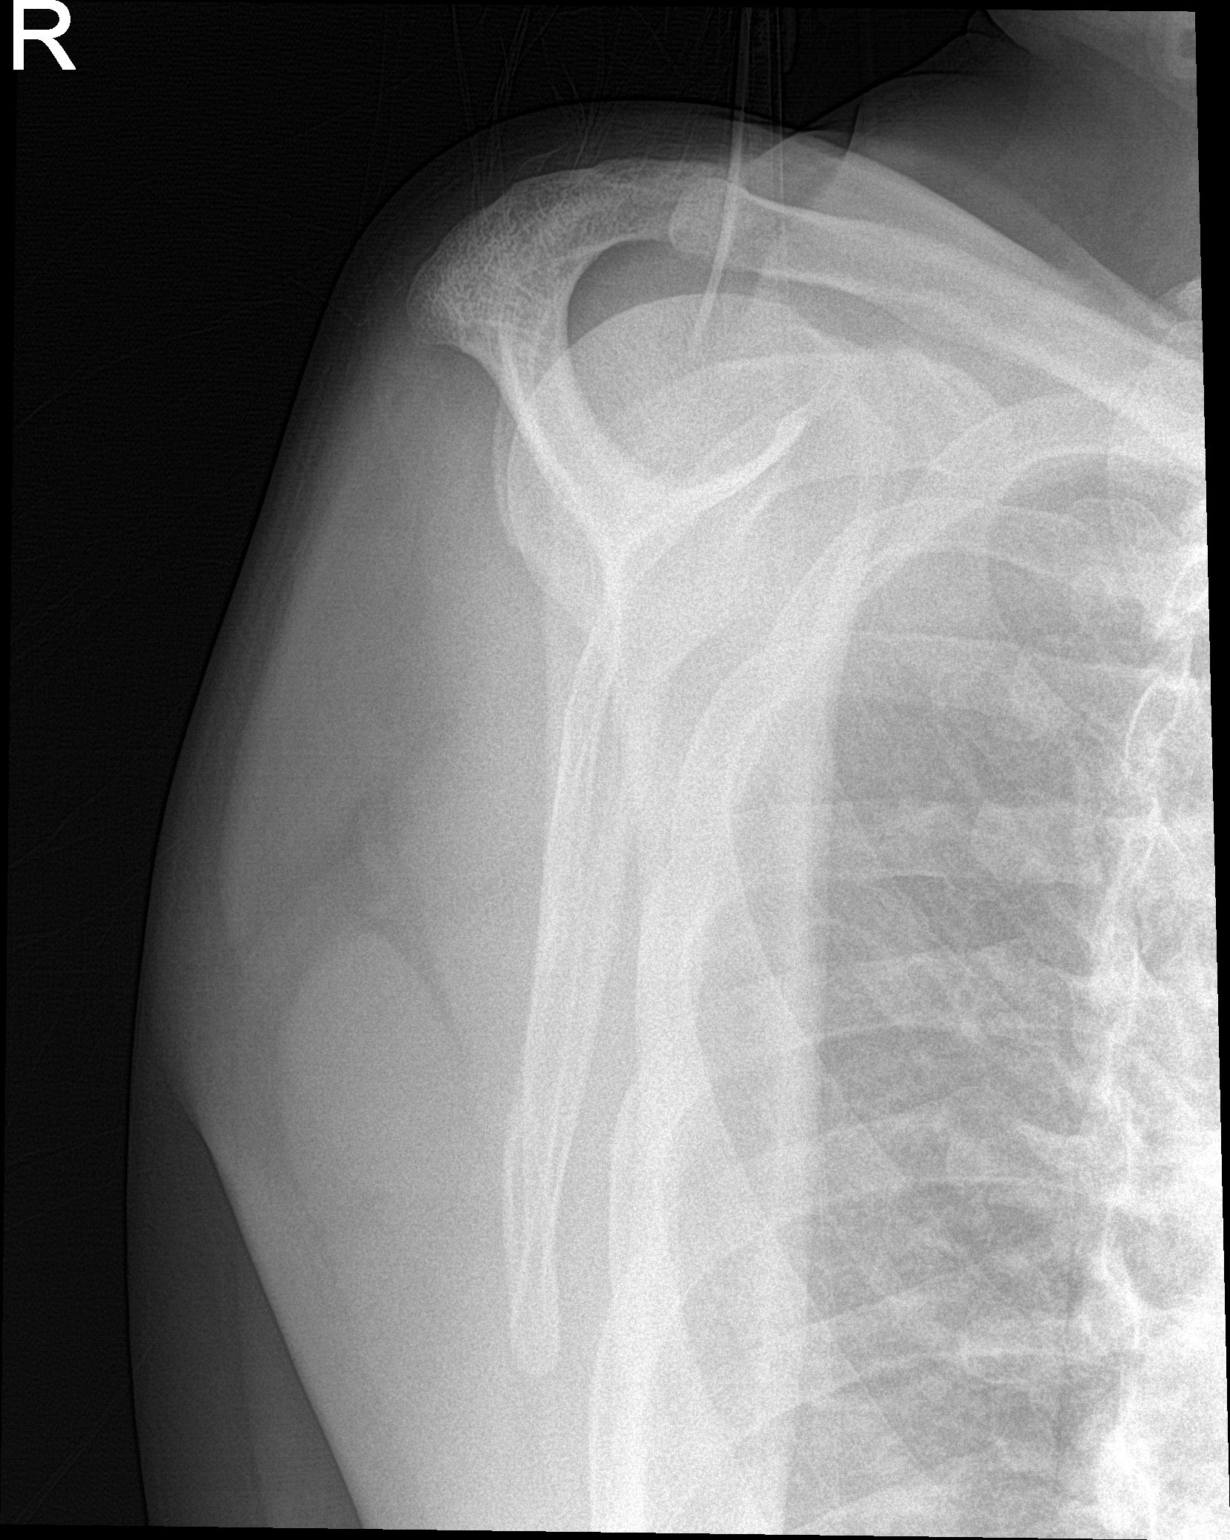

[2 of 2 positions shown; findings below may reference images not displayed]

FINDINGS: There is no evidence of fracture or dislocation. There is no
evidence of arthropathy or other focal bone abnormality. Soft
tissues are unremarkable.
IMPRESSION: Negative.

## 2017-01-26 IMAGING — CR DG CHEST 1V PORT
2 series · 2 of 2 positions shown · non-contrast
Comparison: None.

CLINICAL DATA: Fall from 8 foot ladder with shoulder pain and chest
pain, initial encounter

EXAM:
PORTABLE CHEST - 1 VIEW

[AP (1 of 2)]
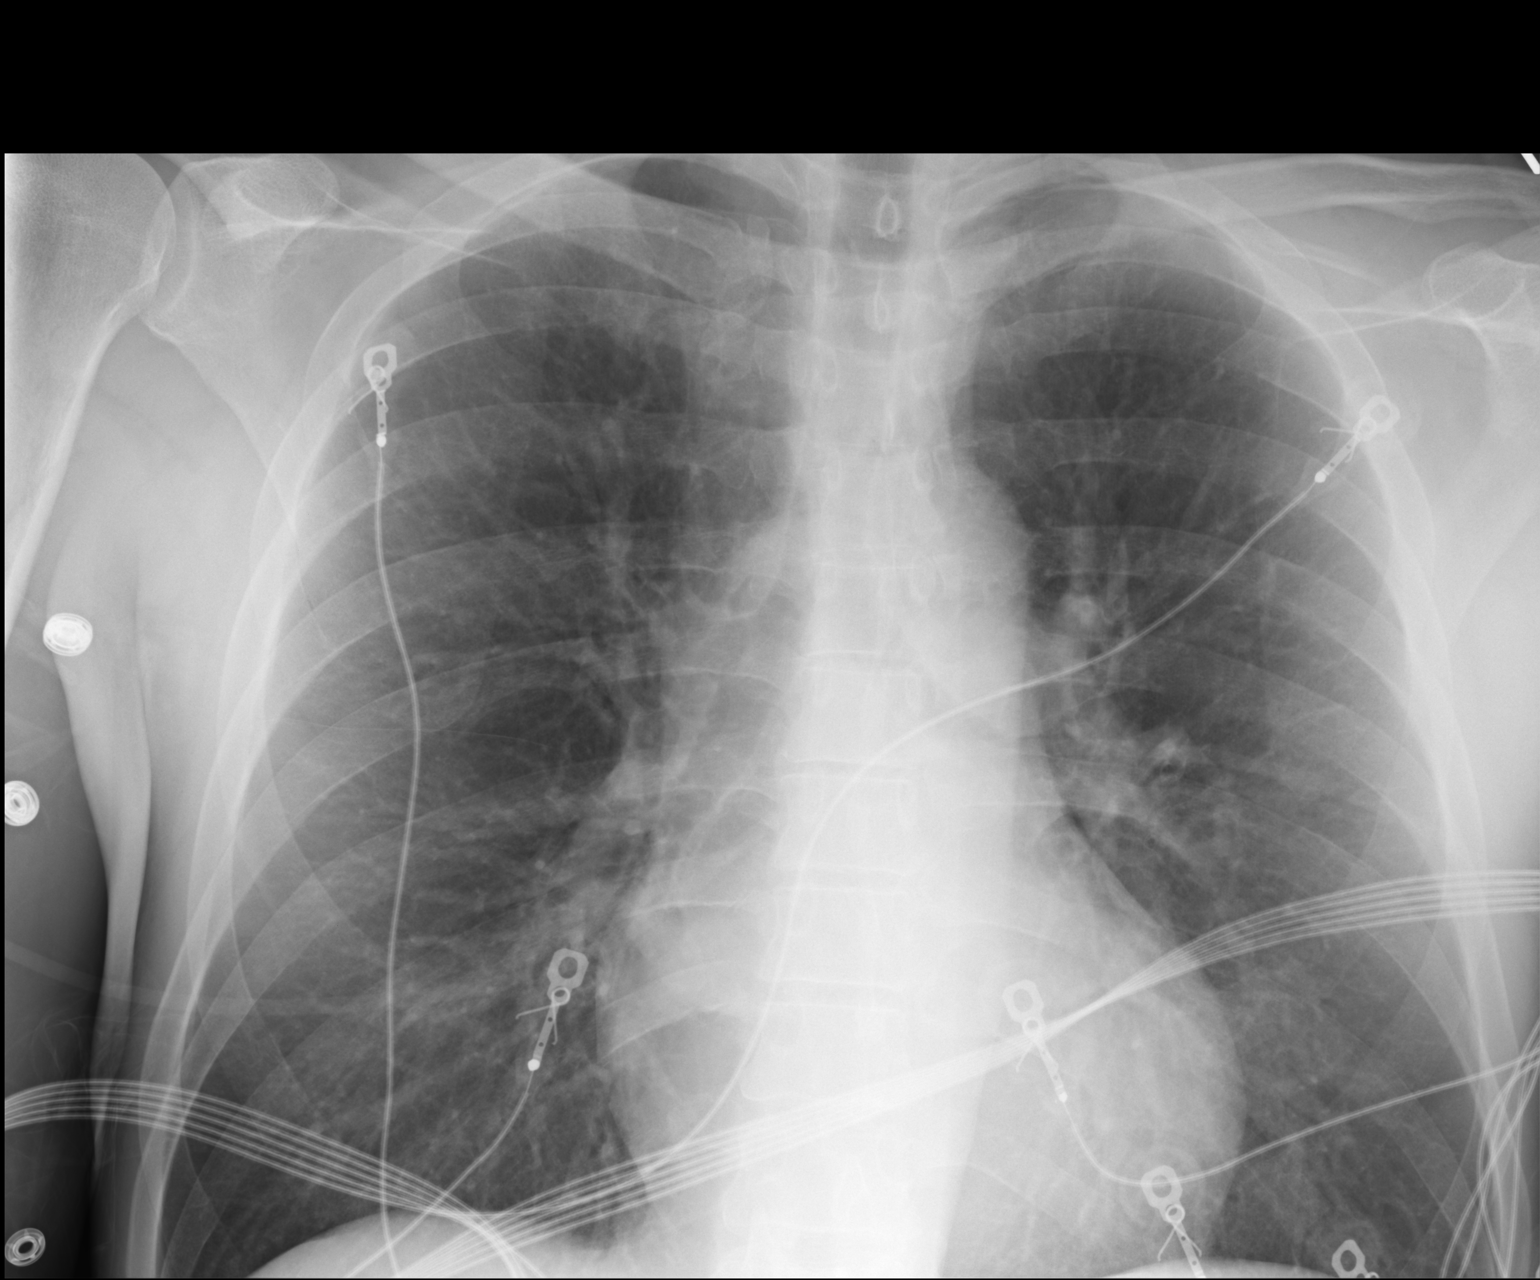

[AP (2 of 2)]
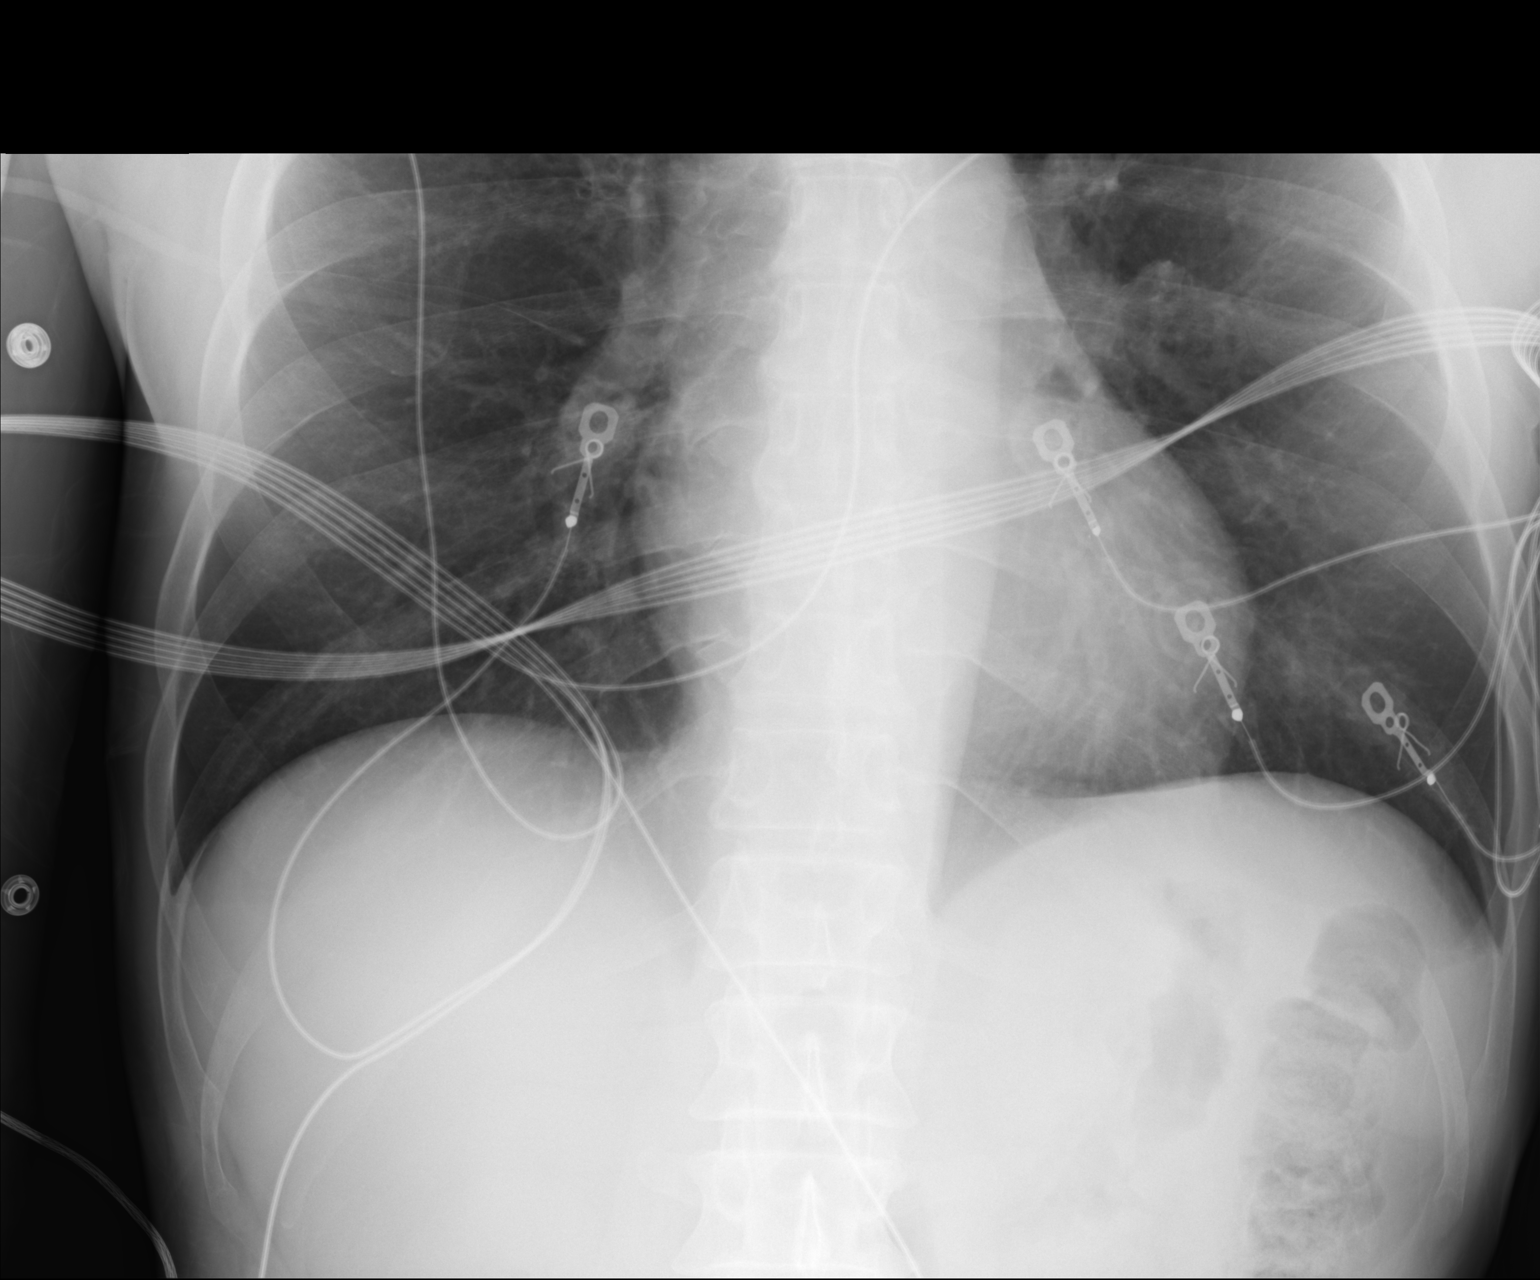

[2 of 2 positions shown; findings below may reference images not displayed]

FINDINGS: The heart size and mediastinal contours are within normal limits.
Both lungs are clear. The visualized skeletal structures are
unremarkable.
IMPRESSION: No acute abnormality noted.

## 2017-01-26 IMAGING — CR DG FINGER LITTLE 2+V*L*
3 series · 3 of 3 positions shown · non-contrast
Comparison: Earlier today at 9105 hours.

CLINICAL DATA: Dislocation of fifth digit. Fell from step ladder at
work.

EXAM:
LEFT LITTLE FINGER 2+V

[finger ap]
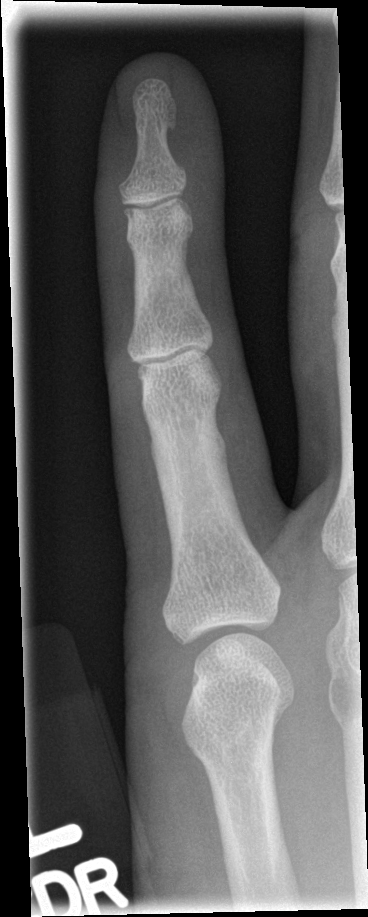

[finger obl]
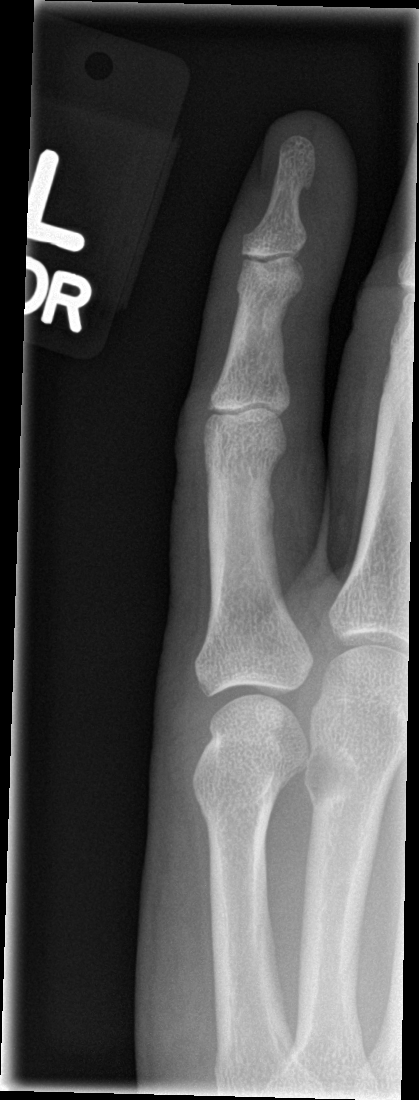

[finger lat]
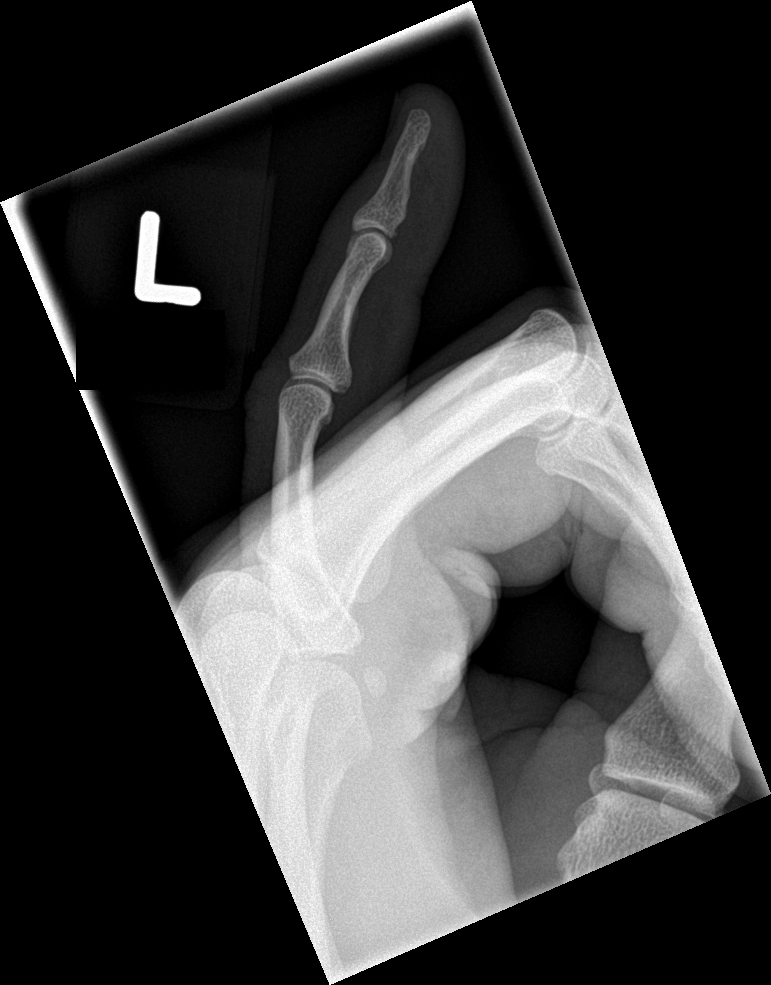

[3 of 3 positions shown; findings below may reference images not displayed]

FINDINGS: Interval relocation of the previously described dislocation
involving the distal interphalangeal joint. No complicating fracture
identified.
IMPRESSION: Interval relocation of the distal interphalangeal joint of the fifth
digit.

## 2017-01-26 IMAGING — CT CT ABD-PELV W/ CM
1 series · 15 of 30 positions shown, 19 images · IV contrast (APPLIED)
Comparison: None.

CLINICAL DATA: Fall from ladder. Loss of consciousness. Bilateral
shoulder pain.

EXAM:
CT CHEST, ABDOMEN, AND PELVIS WITH CONTRAST
TECHNIQUE: Multidetector CT imaging of the chest, abdomen and pelvis was
performed following the standard protocol during bolus
administration of intravenous contrast.
CONTRAST:  100mL OMNIPAQUE IOHEXOL 300 MG/ML  SOLN

[Series 2: delay 5.0 i31f 1 · axial · delayed · 0.68mm/px · z∈[-663,-478]mm · 15 of 41 slices shown, 19 images]
[im 2/41  mediastinal]
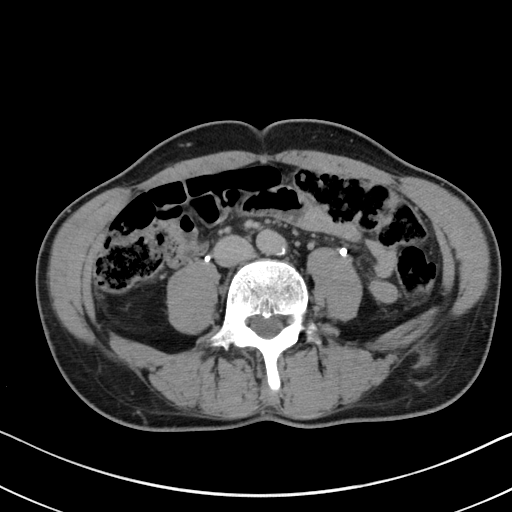
[im 2/41  lung]
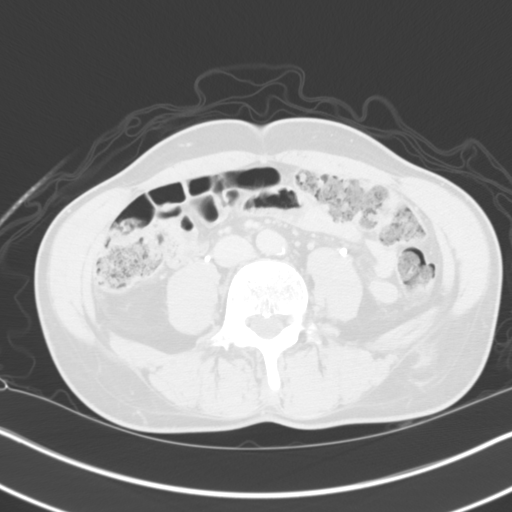
[im 5/41  lung]
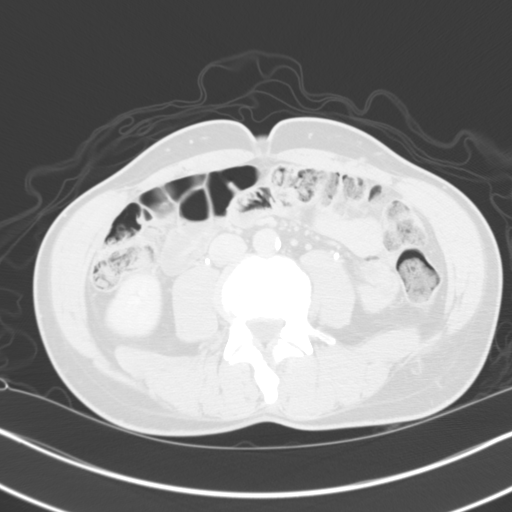
[im 8/41  lung]
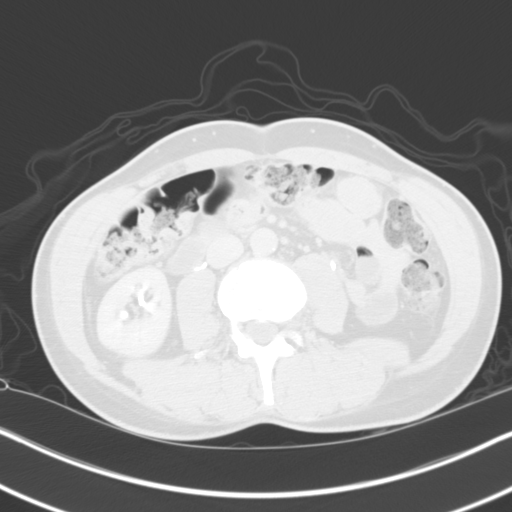
[im 11/41  lung]
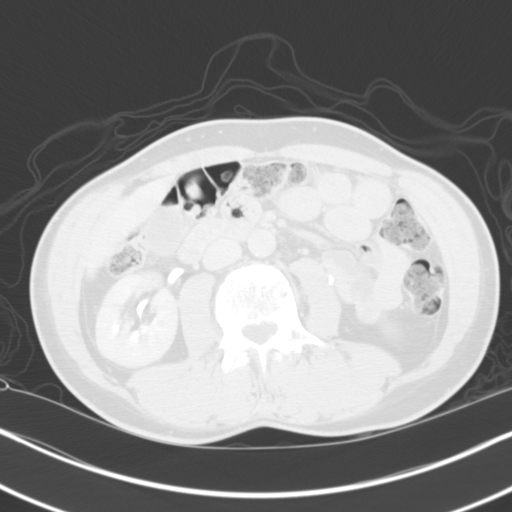
[im 14/41  mediastinal]
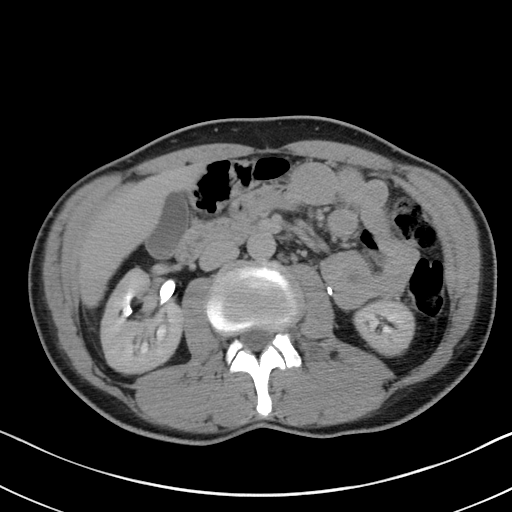
[im 14/41  lung]
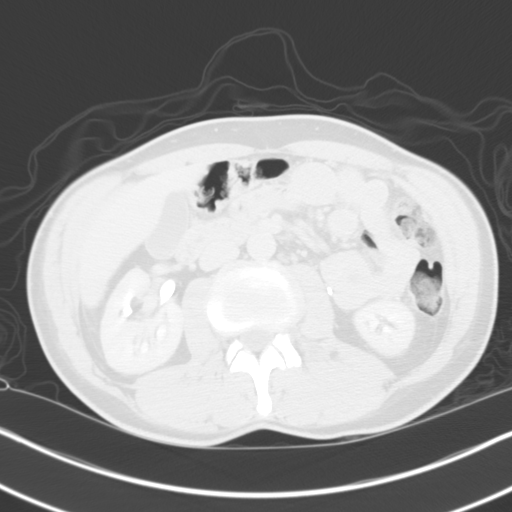
[im 17/41  lung]
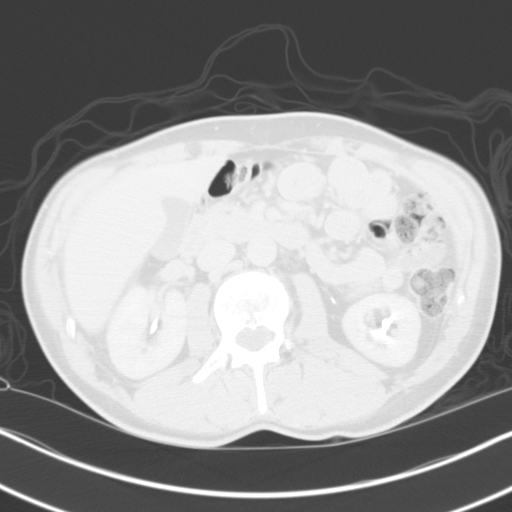
[im 19/41  lung]
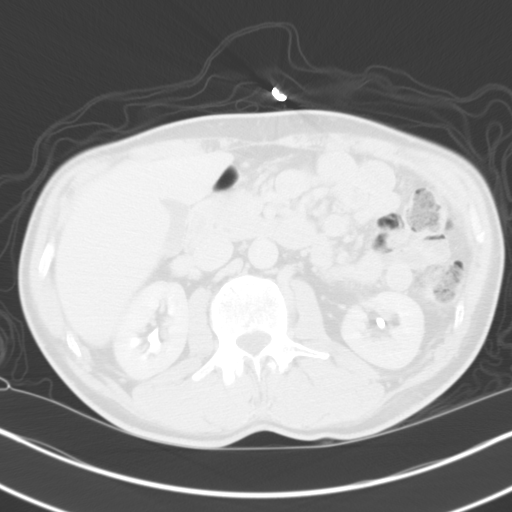
[im 21/41  lung]
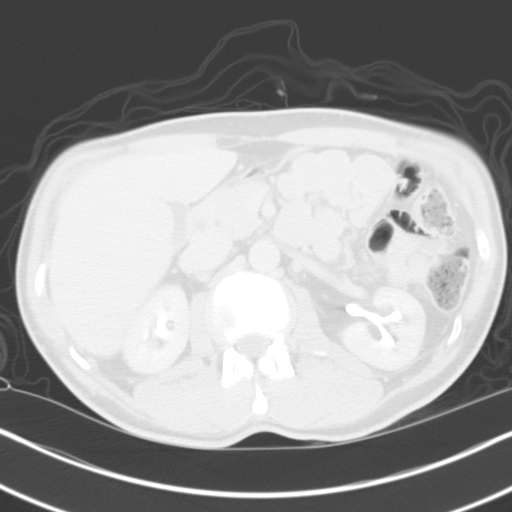
[im 23/41  mediastinal]
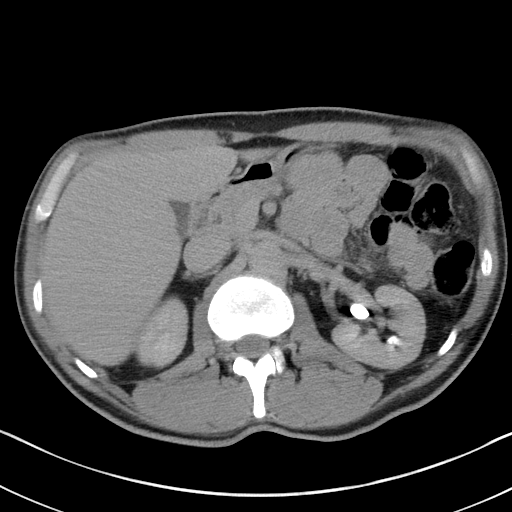
[im 23/41  lung]
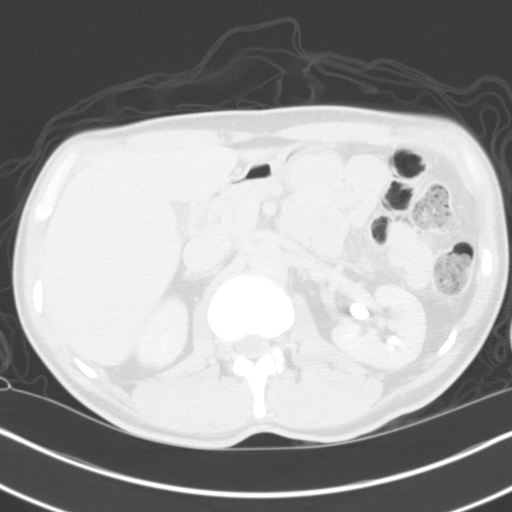
[im 25/41  lung]
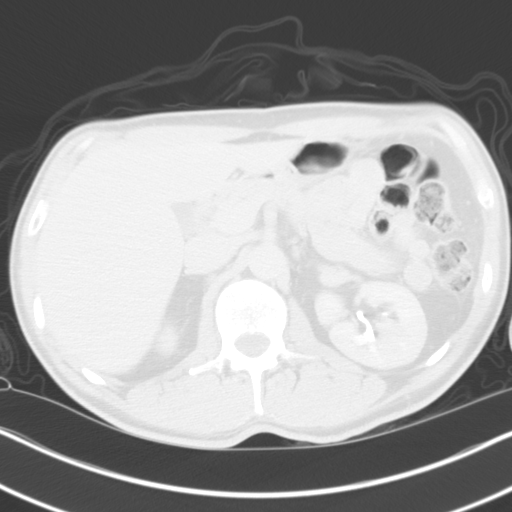
[im 27/41  lung]
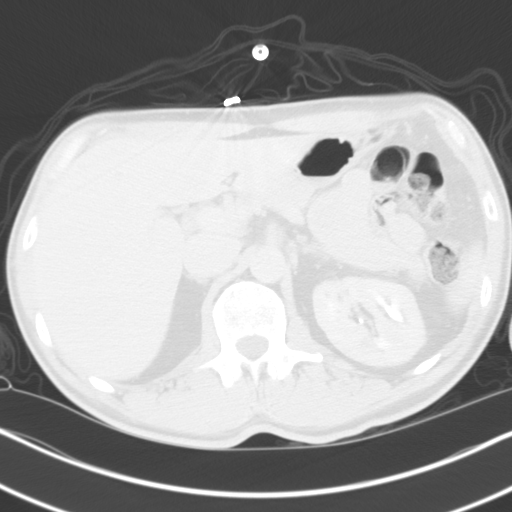
[im 30/41  lung]
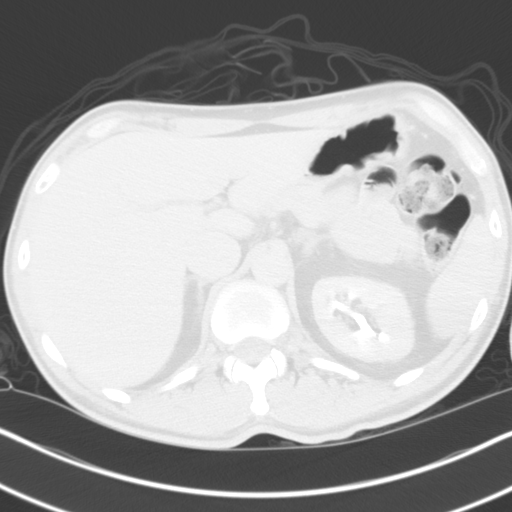
[im 33/41  mediastinal]
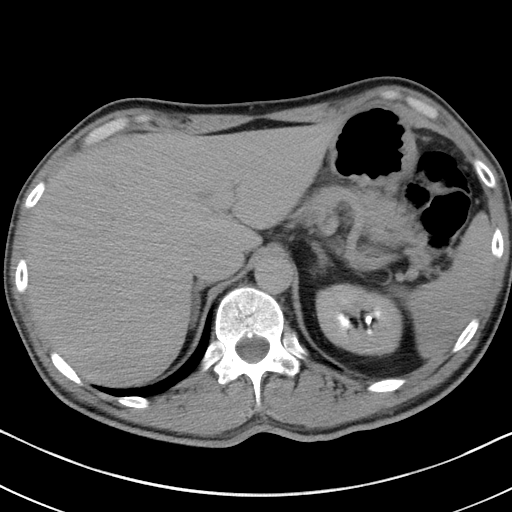
[im 33/41  lung]
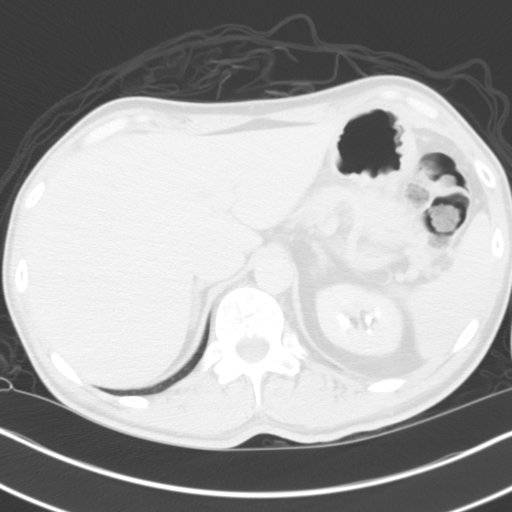
[im 36/41  lung]
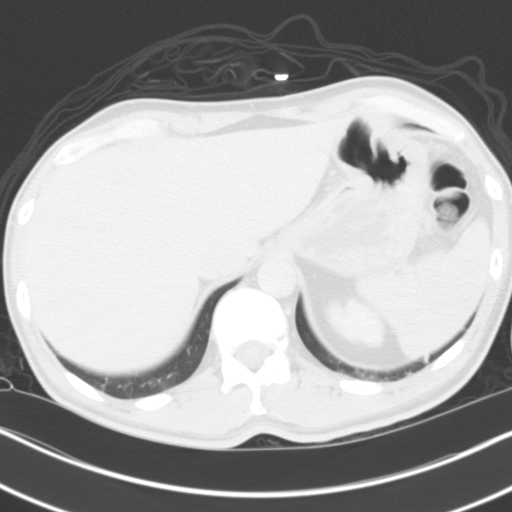
[im 39/41  lung]
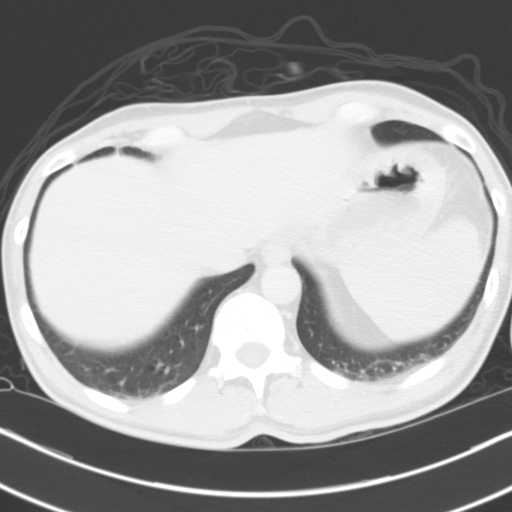

[15 of 30 positions shown; findings below may reference images not displayed]

FINDINGS: CT CHEST FINDINGS

Mediastinum: The heart size appears normal. No pericardial effusion
identified. There is no mediastinal or hilar adenopathy identified.
The trachea appears patent and is midline. Normal appearance of the
esophagus.

Lungs/Pleura: No pleural effusion. The lungs appear clear. No
pneumothorax or evidence of pulmonary contusion. Perifissural nodule
is identified within the right midlung measuring 11 mm. No solid
parenchymal nodules identified.

Musculoskeletal: No acute bone abnormality identified.

CT ABDOMEN AND PELVIS FINDINGS

Hepatobiliary: No suspicious liver abnormality identified. The
gallbladder appears normal. There is no biliary dilatation.

Pancreas: Normal appearance of the pancreas.

Spleen: Negative

Adrenals/Urinary Tract: Normal appearance of the adrenal glands. The
kidneys are unremarkable. The urinary bladder appears normal.

Stomach/Bowel: The stomach is within normal limits. The small bowel
loops have a normal course and caliber. No obstruction. Normal
appearance of the colon. The appendix is visualized and appears
normal.

Vascular/Lymphatic: Normal appearance of the abdominal aorta. No
enlarged retroperitoneal or mesenteric adenopathy. No enlarged
pelvic or inguinal lymph nodes.

Reproductive: Prostate gland and seminal vesicles appear normal.

Other: There is no ascites or focal fluid collections within the
abdomen or pelvis.

Musculoskeletal: The visualized osseous structures appear intact. No
acute bone abnormality noted.
IMPRESSION: 1. No acute findings identified within the chest, abdomen or pelvis.

## 2017-03-29 ENCOUNTER — Other Ambulatory Visit: Payer: Self-pay

## 2017-04-12 ENCOUNTER — Ambulatory Visit: Payer: Self-pay | Admitting: Endocrinology

## 2017-04-23 ENCOUNTER — Encounter: Payer: Self-pay | Admitting: Endocrinology

## 2017-04-23 ENCOUNTER — Telehealth: Payer: Self-pay | Admitting: Endocrinology

## 2017-04-23 ENCOUNTER — Other Ambulatory Visit: Payer: Self-pay

## 2017-04-23 ENCOUNTER — Ambulatory Visit: Payer: Managed Care, Other (non HMO) | Admitting: Endocrinology

## 2017-04-23 VITALS — BP 124/70 | HR 79 | Wt 205.0 lb

## 2017-04-23 DIAGNOSIS — E119 Type 2 diabetes mellitus without complications: Secondary | ICD-10-CM

## 2017-04-23 LAB — POCT GLYCOSYLATED HEMOGLOBIN (HGB A1C): HEMOGLOBIN A1C: 9.8

## 2017-04-23 MED ORDER — INSULIN ASPART 100 UNIT/ML ~~LOC~~ SOLN: 15.0000 [IU] | Freq: Every day | SUBCUTANEOUS | 11 refills | Status: DC

## 2017-04-23 MED ORDER — BASAGLAR KWIKPEN 100 UNIT/ML ~~LOC~~ SOPN: 45.0000 [IU] | PEN_INJECTOR | SUBCUTANEOUS | 5 refills | Status: DC

## 2017-04-23 NOTE — Telephone Encounter (Signed)
Ok to send in for the 14 day? Other two prescriptions you already sent.

## 2017-04-23 NOTE — Progress Notes (Signed)
Subjective:    Patient ID: Alex Watson, male    DOB: 25-Feb-1966, 51 y.o.   MRN: [id number]  HPI Pt returns for f/u of diabetes mellitus: DM type: he is presumed to be developing type 1, due to lean body habitus.  Dx'ed: 3875 Complications: none.   Therapy: insulin since mid-2017.   DKA: never. Severe hypoglycemia: never.   Pancreatitis: never.  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  Interval history: He takes basaglar 20 units bid, and novolog, 10 units with supper.  He reports tremor if cbg is as low as 100.  He says he seldom misses the insulin.  Staff downloaded continuous glucose monitor data, and we reviewed together.  It varies from 100-400.  It is highest at HS.   Past Medical History:  Diagnosis Date  . Allergy   . Diabetes mellitus without complication ([place])   . Uncontrolled daytime somnolence 03/07/2013    Past Surgical History:  Procedure Laterality Date  . MANDIBLE SURGERY  1987    Social History   Socioeconomic History  . Marital status: Married    Spouse name: Not on file  . Number of children: Not on file  . Years of education: Not on file  . Highest education level: Not on file  Social Needs  . Financial resource strain: Not on file  . Food insecurity - worry: Not on file  . Food insecurity - inability: Not on file  . Transportation needs - medical: Not on file  . Transportation needs - non-medical: Not on file  Occupational History  . Not on file  Tobacco Use  . Smoking status: Current Every Day Smoker    Years: 24.00    Types: Cigarettes  . Smokeless tobacco: Never Used  Substance and Sexual Activity  . Alcohol use: Yes    Comment: BEER  . Drug use: No  . Sexual activity: Yes    Comment: number of sex partners in the last 12 months 1  Other Topics Concern  . Not on file  Social History Narrative  . Not on file    Current Outpatient Medications  on File Prior to Visit  Medication Sig Dispense Refill  . blood glucose meter kit and supplies KIT Dispense based on patient and insurance preference. Use up to four times daily as directed. (FOR ICD-9 250.00, 250.01). 1 each 0  . Continuous Blood Gluc Receiver (FREESTYLE LIBRE READER) DEVI 1 Device by Does not apply route as directed. 1 Device 0  . Continuous Blood Gluc Sensor ([place]) MISC Apply to upper arm and change sensor every 10 days 3 each 3  . diclofenac (VOLTAREN) 75 MG EC tablet Take 1 tablet (75 mg total) by mouth 2 (two) times daily. 20 tablet 3  . glucose blood (ONE TOUCH ULTRA TEST) test strip TEST four times a day 100 each 0  . ibuprofen (ADVIL,MOTRIN) 800 MG tablet Take 1 tablet (800 mg total) by mouth 3 (three) times daily. 90 tablet 0  . Insulin Syringe-Needle U-100 (INSULIN SYRINGE 1CC/30GX1/2") 30G X 1/2" 1 ML MISC Use to inject insulin 2 times per day. 200 each 2  . methocarbamol (ROBAXIN) 500 MG tablet Take 1 in the morning, 1 in the afternoon, and 2 at bedtime for muscle relaxant 40 tablet 3  . ONETOUCH DELICA LANCETS FINE MISC TEST 1-4 TIMES PER DAY FOR GLUCOSE CHECK 300 each 3  .  traMADol (ULTRAM) 50 MG tablet Take 1 tablet (50 mg total) by mouth every 8 (eight) hours as needed. 20 tablet 3   No current facility-administered medications on file prior to visit.     Allergies  Allergen Reactions  . Morphine     REACTION: difficulty breathing    Family History  Problem Relation Age of Onset  . Asthma Mother   . Arthritis Mother   . COPD Father   . Heart disease Sister        eisenberger syndrome  . Lupus Sister   . Diabetes Father     BP 124/70 (BP Location: Left Arm, Patient Position: Sitting, Cuff Size: Normal)   Pulse 79   Wt 205 lb (93 kg)   SpO2 97%   BMI 24.95 kg/m    Review of Systems He denies hypoglycemia.     Objective:   Physical Exam VITAL SIGNS:  See vs page.  GENERAL: no distress.  Pulses: foot pulses are  intact bilaterally.   MSK: no deformity of the feet or ankles.  CV: no edema of the legs or ankles.  Skin:  no ulcer on the feet or ankles.  normal color and temp on the feet and ankles Neuro: sensation is intact to touch on the feet and ankles.   Ext: right great toenail is absent.   A1c=9.8%     Assessment & Plan:  Type 1 DM: he needs increased rx   Patient Instructions  check your blood sugar twice a day.  vary the time of day when you check, between before the 3 meals, and at bedtime.  also check if you have symptoms of your blood sugar being too high or too low.  please keep a record of the readings and bring it to your next appointment here.  You can write it on any piece of paper.  please call us sooner if your blood sugar goes below 70.   Please change the insulins to the numbers listed below.    Please come back for a follow-up appointment in 2 months.

## 2017-04-23 NOTE — Telephone Encounter (Signed)
OK 

## 2017-04-23 NOTE — Telephone Encounter (Signed)
Alex Watson called (wife) because patient was seen today-needs to make sure scripts for PACCAR IncBasaglar Kwik pen and the Humalog vial are filled He is on [employer] Group[employer]estyle Libre 10 day patch-can he get a prescription for the 14 day patch?

## 2017-04-23 NOTE — Patient Instructions (Addendum)
check your blood sugar twice a day.  vary the time of day when you check, between before the 3 meals, and at bedtime.  also check if you have symptoms of your blood sugar being too high or too low.  please keep a record of the readings and bring it to your next appointment here.  You can write it on any piece of paper.  please call us sooner if your blood sugar goes below 70.   Please change the insulins to the numbers listed below.    Please come back for a follow-up appointment in 2 months.

## 2017-04-26 MED ORDER — FREESTYLE LIBRE 14 DAY SENSOR MISC: 1.0000 | 2 refills | Status: DC

## 2017-04-26 NOTE — Telephone Encounter (Signed)
Refill submitted. 

## 2017-04-27 ENCOUNTER — Other Ambulatory Visit: Payer: Self-pay

## 2017-04-27 MED ORDER — INSULIN LISPRO 100 UNIT/ML ~~LOC~~ SOLN: SUBCUTANEOUS | 2 refills | Status: DC

## 2017-05-05 ENCOUNTER — Other Ambulatory Visit: Payer: Self-pay

## 2017-05-06 ENCOUNTER — Other Ambulatory Visit: Payer: Self-pay

## 2017-05-06 MED ORDER — FREESTYLE LIBRE 14 DAY READER DEVI: 1.0000 | 1 refills | Status: DC

## 2017-05-29 ENCOUNTER — Other Ambulatory Visit: Payer: Self-pay | Admitting: Endocrinology

## 2017-06-09 ENCOUNTER — Other Ambulatory Visit: Payer: Self-pay | Admitting: Endocrinology

## 2017-06-10 ENCOUNTER — Other Ambulatory Visit: Payer: Self-pay

## 2017-06-10 ENCOUNTER — Telehealth: Payer: Self-pay | Admitting: Endocrinology

## 2017-06-10 MED ORDER — GLUCOSE BLOOD VI STRP: ORAL_STRIP | 3 refills | Status: DC

## 2017-06-10 NOTE — Telephone Encounter (Signed)
Patient need a refill of test strips for the first response meter, (I didn't see that meter in his medication list)  Send to  Pharmacy:  [id number] [place] - [id number] DEA #:  [id number]

## 2017-06-10 NOTE — Telephone Encounter (Signed)
I called to clarify it was one touch ultra test strips that patient needed & I have sent them to [facility].

## 2017-06-25 ENCOUNTER — Ambulatory Visit: Payer: Self-pay | Admitting: Endocrinology

## 2017-07-05 ENCOUNTER — Ambulatory Visit: Payer: Managed Care, Other (non HMO) | Admitting: Endocrinology

## 2017-07-05 ENCOUNTER — Encounter: Payer: Self-pay | Admitting: Endocrinology

## 2017-07-05 VITALS — BP 114/62 | HR 82 | Ht 76.0 in | Wt 204.2 lb

## 2017-07-05 DIAGNOSIS — E119 Type 2 diabetes mellitus without complications: Secondary | ICD-10-CM | POA: Diagnosis not present

## 2017-07-05 LAB — POCT GLYCOSYLATED HEMOGLOBIN (HGB A1C): HEMOGLOBIN A1C: 8.7

## 2017-07-05 LAB — HM DIABETES EYE EXAM

## 2017-07-05 MED ORDER — BASAGLAR KWIKPEN 100 UNIT/ML ~~LOC~~ SOPN: 17.0000 [IU] | PEN_INJECTOR | Freq: Two times a day (BID) | SUBCUTANEOUS | 5 refills | Status: DC

## 2017-07-05 NOTE — Patient Instructions (Addendum)
check your blood sugar twice a day.  vary the time of day when you check, between before the 3 meals, and at bedtime.  also check if you have symptoms of your blood sugar being too high or too low.  please keep a record of the readings and bring it to your next appointment here.  You can write it on any piece of paper.  please call us sooner if your blood sugar goes below 70.   Please change the insulins to the numbers listed below.    Please come back for a follow-up appointment in 2-3 months.

## 2017-07-05 NOTE — Progress Notes (Signed)
Subjective:    Patient ID: Alex Watson, male    DOB: [birth year], 52 y.o.   MRN: [id number]  HPI Pt returns for f/u of diabetes mellitus: DM type: 1  Dx'ed: 3295 Complications: none.   Therapy: insulin since mid-2017.   DKA: never. Severe hypoglycemia: never.   Pancreatitis: never.  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   Interval history: no cbg record, but states cbg's are frequently low in the afternoon, and highest at HS (although it is not high 2 hrs after super).  Therefore, he takes basaglar, just 32 units each monring.   Past Medical History:  Diagnosis Date  . Allergy   . Diabetes mellitus without complication ([place])   . Uncontrolled daytime somnolence 03/07/2013    Past Surgical History:  Procedure Laterality Date  . MANDIBLE SURGERY  1987    Social History   Socioeconomic History  . Marital status: Married    Spouse name: Not on file  . Number of children: Not on file  . Years of education: Not on file  . Highest education level: Not on file  Social Needs  . Financial resource strain: Not on file  . Food insecurity - worry: Not on file  . Food insecurity - inability: Not on file  . Transportation needs - medical: Not on file  . Transportation needs - non-medical: Not on file  Occupational History  . Not on file  Tobacco Use  . Smoking status: Current Every Day Smoker    Years: 24.00    Types: Cigarettes  . Smokeless tobacco: Never Used  Substance and Sexual Activity  . Alcohol use: Yes    Comment: BEER  . Drug use: No  . Sexual activity: Yes    Comment: number of sex partners in the last 12 months 1  Other Topics Concern  . Not on file  Social History Narrative  . Not on file    Current Outpatient Medications on File Prior to Visit  Medication Sig Dispense Refill  .  blood glucose meter kit and supplies KIT Dispense based on patient and insurance preference. Use up to four times daily as directed. (FOR ICD-9 250.00, 250.01). 1 each 0  . Continuous Blood Gluc Receiver (FREESTYLE LIBRE 14 DAY READER) DEVI 1 Device by Does not apply route as directed. Use as directed to test blood sugar daily 1 Device 1  . Continuous Blood Gluc Sensor (FREESTYLE LIBRE 14 DAY SENSOR) MISC 1 each by Does not apply route every 14 (fourteen) days. 1 each 2  . diclofenac (VOLTAREN) 75 MG EC tablet Take 1 tablet (75 mg total) by mouth 2 (two) times daily. 20 tablet 3  . glucose blood (ONE TOUCH ULTRA TEST) test strip TEST four times a day 100 each 3  . ibuprofen (ADVIL,MOTRIN) 800 MG tablet Take 1 tablet (800 mg total) by mouth 3 (three) times daily. 90 tablet 0  . insulin aspart (NOVOLOG) 100 UNIT/ML injection Inject 15 Units into the skin daily with supper. And syringes 1/day 30 mL 11  . insulin lispro (HUMALOG) 100 UNIT/ML injection Inject 15 units daily with supper 20 mL 2  . Insulin Syringe-Needle U-100 (INSULIN SYRINGE 1CC/30GX1/2") 30G X 1/2" 1 ML MISC Use to inject insulin 2 times per day. 200 each 2  . methocarbamol (ROBAXIN) 500  MG tablet Take 1 in the morning, 1 in the afternoon, and 2 at bedtime for muscle relaxant 40 tablet 3  . ONETOUCH DELICA LANCETS FINE MISC TEST 1-4 TIMES PER DAY FOR GLUCOSE CHECK 300 each 3  . traMADol (ULTRAM) 50 MG tablet Take 1 tablet (50 mg total) by mouth every 8 (eight) hours as needed. 20 tablet 3   No current facility-administered medications on file prior to visit.     Allergies  Allergen Reactions  . Morphine     REACTION: difficulty breathing    Family History  Problem Relation Age of Onset  . Asthma Mother   . Arthritis Mother   . COPD Father   . Heart disease Sister        eisenberger syndrome  . Lupus Sister   . Diabetes Father     BP 114/62   Pulse 82   Ht 6' 4" (1.93 m)   Wt 204 lb 3.2 oz (92.6 kg)   SpO2 97%   BMI  24.86 kg/m    Review of Systems Denies LOC.      Objective:   Physical Exam VITAL SIGNS:  See vs page.  GENERAL: no distress.  Pulses: foot pulses are intact bilaterally.   MSK: no deformity of the feet or ankles.  CV: no edema of the legs or ankles.  Skin:  no ulcer on the feet or ankles.  normal color and temp on the feet and ankles Neuro: sensation is intact to touch on the feet and ankles.   Ext: right great toenail is absent.    Lab Results  Component Value Date   HGBA1C 8.7 07/05/2017      Assessment & Plan:  Type 1 DM: he needs increased rx.     Patient Instructions  check your blood sugar twice a day.  vary the time of day when you check, between before the 3 meals, and at bedtime.  also check if you have symptoms of your blood sugar being too high or too low.  please keep a record of the readings and bring it to your next appointment here.  You can write it on any piece of paper.  please call us sooner if your blood sugar goes below 70.   Please change the insulins to the numbers listed below.    Please come back for a follow-up appointment in 2-3 months.

## 2017-08-05 ENCOUNTER — Other Ambulatory Visit: Payer: Self-pay | Admitting: Endocrinology

## 2017-08-28 ENCOUNTER — Other Ambulatory Visit: Payer: Self-pay | Admitting: Endocrinology

## 2017-09-27 ENCOUNTER — Telehealth: Payer: Self-pay | Admitting: Endocrinology

## 2017-09-27 ENCOUNTER — Other Ambulatory Visit: Payer: Self-pay

## 2017-09-27 DIAGNOSIS — E119 Type 2 diabetes mellitus without complications: Secondary | ICD-10-CM

## 2017-09-27 MED ORDER — FREESTYLE LIBRE 14 DAY SENSOR MISC: 3.0000 | Freq: Every day | 0 refills | Status: DC | PRN

## 2017-09-27 NOTE — Telephone Encounter (Signed)
I have sent 14 day sensors because all 10 day sensors plus device has been d/c. Patient stated that due to cost he may have to go back to just checking with glucometer. He has had issues with sensors being bad when inserted.

## 2017-09-27 NOTE — Telephone Encounter (Signed)
need a prescription for the Continuous Blood Gluc Receiver ([address] [place] READER) DEVI [[id number]]    Pharmacy:  [facility] [phone] [address], [place] - [phone] AT [facility] [address] DEA #:  [id number]    Pharmacy Comments:  --

## 2017-09-29 ENCOUNTER — Encounter: Payer: Self-pay | Admitting: Endocrinology

## 2017-09-29 ENCOUNTER — Ambulatory Visit: Payer: 59 | Admitting: Endocrinology

## 2017-09-29 VITALS — BP 104/64 | HR 76 | Wt 200.4 lb

## 2017-09-29 DIAGNOSIS — E119 Type 2 diabetes mellitus without complications: Secondary | ICD-10-CM

## 2017-09-29 LAB — POCT GLYCOSYLATED HEMOGLOBIN (HGB A1C): Hemoglobin A1C: 9.6 % — AB (ref 4.0–5.6)

## 2017-09-29 MED ORDER — BASAGLAR KWIKPEN 100 UNIT/ML ~~LOC~~ SOPN: 50.0000 [IU] | PEN_INJECTOR | SUBCUTANEOUS | 5 refills | Status: DC

## 2017-09-29 NOTE — Progress Notes (Signed)
Subjective:    Patient ID: Alex Watson, male    DOB: 07/01/1965, 52 y.o.   MRN: [id number]  HPI Pt returns for f/u of diabetes mellitus: DM type: 1  Dx'ed: 1497 Complications: none.   Therapy: insulin since mid-2017.   DKA: never. Severe hypoglycemia: never.   Pancreatitis: never.  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   Interval history: no cbg record, but states cbg's vary from 85-450. There is no trend throughout the day.  he takes basaglar, 40 units qd, and novolog 15 units with the evening meal.    Past Medical History:  Diagnosis Date  . Allergy   . Diabetes mellitus without complication ([place])   . Uncontrolled daytime somnolence 03/07/2013    Past Surgical History:  Procedure Laterality Date  . MANDIBLE SURGERY  1987    Social History   Socioeconomic History  . Marital status: Married    Spouse name: Not on file  . Number of children: Not on file  . Years of education: Not on file  . Highest education level: Not on file  Occupational History  . Not on file  Social Needs  . Financial resource strain: Not on file  . Food insecurity:    Worry: Not on file    Inability: Not on file  . Transportation needs:    Medical: Not on file    Non-medical: Not on file  Tobacco Use  . Smoking status: Current Every Day Smoker    Years: 24.00    Types: Cigarettes  . Smokeless tobacco: Never Used  Substance and Sexual Activity  . Alcohol use: Yes    Comment: BEER  . Drug use: No  . Sexual activity: Yes    Comment: number of sex partners in the last 12 months 1  Lifestyle  . Physical activity:    Days per week: Not on file    Minutes per session: Not on file  . Stress: Not on file  Relationships  . Social connections:    Talks on phone: Not on file    Gets together: Not on file   Attends religious service: Not on file    Active member of club or organization: Not on file    Attends meetings of clubs or organizations: Not on file    Relationship status: Not on file  . Intimate partner violence:    Fear of current or ex partner: Not on file    Emotionally abused: Not on file    Physically abused: Not on file    Forced sexual activity: Not on file  Other Topics Concern  . Not on file  Social History Narrative  . Not on file    Current Outpatient Medications on File Prior to Visit  Medication Sig Dispense Refill  . albuterol (PROVENTIL HFA;VENTOLIN HFA) 108 (90 Base) MCG/ACT inhaler INHALE 1 PUFF PO Q 4 TO 6 H PRN  0  . azithromycin (ZITHROMAX) 250 MG tablet   0  . blood glucose meter kit and supplies KIT Dispense based on patient and insurance preference. Use up to four times daily as directed. (FOR ICD-9 250.00, 250.01). 1 each 0  . Continuous Blood Gluc Receiver (FREESTYLE LIBRE 14 DAY READER) DEVI USE AS DIRECTED 1 Device 0  . Continuous Blood Gluc Sensor (FREESTYLE LIBRE 14  DAY SENSOR) MISC APPLY 1 SENSOR AND REPLACE EVERY 14 DAYS 2 each 0  . Continuous Blood Gluc Sensor (FREESTYLE LIBRE 14 DAY SENSOR) MISC Inject 3 Devices into the skin daily as needed. 2 each 0  . diclofenac (VOLTAREN) 75 MG EC tablet Take 1 tablet (75 mg total) by mouth 2 (two) times daily. (Patient not taking: Reported on 09/30/2017) 20 tablet 3  . glucose blood (ONE TOUCH ULTRA TEST) test strip TEST four times a day 100 each 3  . insulin aspart (NOVOLOG) 100 UNIT/ML injection Inject 15 Units into the skin daily with supper. And syringes 1/day (Patient not taking: Reported on 09/30/2017) 30 mL 11  . insulin lispro (HUMALOG) 100 UNIT/ML injection Inject 15 units daily with supper 20 mL 2  . Insulin Syringe-Needle U-100 (INSULIN SYRINGE 1CC/30GX1/2") 30G X 1/2" 1 ML MISC Use to inject insulin 2 times per day. 200 each 2  . methocarbamol (ROBAXIN) 500 MG tablet Take 1 in the morning, 1 in the  afternoon, and 2 at bedtime for muscle relaxant (Patient not taking: Reported on 09/30/2017) 40 tablet 3  . ONETOUCH DELICA LANCETS FINE MISC TEST 1-4 TIMES PER DAY FOR GLUCOSE CHECK 300 each 3  . traMADol (ULTRAM) 50 MG tablet Take 1 tablet (50 mg total) by mouth every 8 (eight) hours as needed. (Patient not taking: Reported on 09/30/2017) 20 tablet 3  . ibuprofen (ADVIL,MOTRIN) 800 MG tablet Take 1 tablet (800 mg total) by mouth 3 (three) times daily. (Patient not taking: Reported on 09/29/2017) 90 tablet 0   No current facility-administered medications on file prior to visit.     Allergies  Allergen Reactions  . Morphine     REACTION: difficulty breathing    Family History  Problem Relation Age of Onset  . Asthma Mother   . Arthritis Mother   . COPD Father   . Heart disease Sister        eisenberger syndrome  . Lupus Sister   . Diabetes Father    BP 104/64   Pulse 76   Wt 200 lb 6.4 oz (90.9 kg)   SpO2 95%   BMI 24.39 kg/m   Review of Systems He denies hypoglycemia    Objective:   Physical Exam VITAL SIGNS:  See vs page.  GENERAL: no distress.  Pulses: foot pulses are intact bilaterally.   MSK: no deformity of the feet or ankles.  CV: no edema of the legs or ankles.  Skin:  no ulcer on the feet or ankles.  normal color and temp on the feet and ankles Neuro: sensation is intact to touch on the feet and ankles.   Ext: right great toenail is absent.    Lab Results  Component Value Date   HGBA1C 9.6 (A) 09/29/2017       Assessment & Plan:  Type 1 DM: worse continuous glucose monitor dysfunction, new Occupational status: we discussed.  Given this time-release insulin, he should carry a non-perishable snack, and should not miss meals.    Patient Instructions  check your blood sugar twice a day.  vary the time of day when you check, between before the 3 meals, and at bedtime.  also check if you have symptoms of your blood sugar being too high or too low.  please  keep a record of the readings and bring it to your next appointment here.  You can write it on any piece of paper.  please call us sooner if your blood sugar goes below  70.   Please change the insulins to the numbers listed below.    Please see Vaughan Basta, to address problems with the sensors Please come back for a follow-up appointment in 2 months.

## 2017-09-29 NOTE — Patient Instructions (Addendum)
check your blood sugar twice a day.  vary the time of day when you check, between before the 3 meals, and at bedtime.  also check if you have symptoms of your blood sugar being too high or too low.  please keep a record of the readings and bring it to your next appointment here.  You can write it on any piece of paper.  please call us sooner if your blood sugar goes below 70.   Please change the insulins to the numbers listed below.    Please see Alex Watson, to address problems with the sensors Please come back for a follow-up appointment in 2 months.

## 2017-09-30 ENCOUNTER — Ambulatory Visit: Payer: 59 | Admitting: Physician Assistant

## 2017-09-30 ENCOUNTER — Encounter: Payer: Self-pay | Admitting: Physician Assistant

## 2017-09-30 ENCOUNTER — Other Ambulatory Visit: Payer: Self-pay

## 2017-09-30 VITALS — BP 112/60 | HR 84 | Temp 99.0°F | Resp 16 | Ht 76.0 in | Wt 200.8 lb

## 2017-09-30 DIAGNOSIS — J22 Unspecified acute lower respiratory infection: Secondary | ICD-10-CM | POA: Diagnosis not present

## 2017-09-30 DIAGNOSIS — R062 Wheezing: Secondary | ICD-10-CM | POA: Diagnosis not present

## 2017-09-30 LAB — POCT CBC
Granulocyte percent: 73.9 %G (ref 37–80)
HEMATOCRIT: 49.8 % (ref 43.5–53.7)
HEMOGLOBIN: 17 g/dL (ref 14.1–18.1)
LYMPH, POC: 2.4 (ref 0.6–3.4)
MCH, POC: 31.5 pg — AB (ref 27–31.2)
MCHC: 34.1 g/dL (ref 31.8–35.4)
MCV: 92.3 fL (ref 80–97)
MID (CBC): 0.8 (ref 0–0.9)
MPV: 7.4 fL (ref 0–99.8)
POC GRANULOCYTE: 8.9 — AB (ref 2–6.9)
POC LYMPH PERCENT: 19.9 %L (ref 10–50)
POC MID %: 6.2 % (ref 0–12)
Platelet Count, POC: 339 10*3/uL (ref 142–424)
RBC: 5.39 M/uL (ref 4.69–6.13)
RDW, POC: 14.1 %
WBC: 12.1 10*3/uL — AB (ref 4.6–10.2)

## 2017-09-30 MED ORDER — IPRATROPIUM BROMIDE 0.02 % IN SOLN
0.5000 mg | Freq: Once | RESPIRATORY_TRACT | Status: AC
  Administered 2017-09-30: 0.5 mg via RESPIRATORY_TRACT

## 2017-09-30 MED ORDER — DOXYCYCLINE HYCLATE 100 MG PO CAPS: 100.0000 mg | ORAL_CAPSULE | Freq: Two times a day (BID) | ORAL | 0 refills | Status: AC

## 2017-09-30 MED ORDER — NAPROXEN 500 MG PO TABS: 500.0000 mg | ORAL_TABLET | Freq: Two times a day (BID) | ORAL | 0 refills | Status: DC

## 2017-09-30 MED ORDER — ALBUTEROL SULFATE (2.5 MG/3ML) 0.083% IN NEBU
2.5000 mg | INHALATION_SOLUTION | Freq: Once | RESPIRATORY_TRACT | Status: AC
Start: 2017-09-30 — End: 2017-09-30
  Administered 2017-09-30: 2.5 mg via RESPIRATORY_TRACT

## 2017-09-30 MED ORDER — BENZONATATE 200 MG PO CAPS: 200.0000 mg | ORAL_CAPSULE | Freq: Two times a day (BID) | ORAL | 0 refills | Status: DC | PRN

## 2017-09-30 NOTE — Patient Instructions (Addendum)
  Please stop smoking.    IF you received an x-ray today, you will receive an invoice from [facility]. Please contact [facility] at [phone] with questions or concerns regarding your invoice.   IF you received labwork today, you will receive an invoice from [facility]. Please contact [facility] at [phone] with questions or concerns regarding your invoice.   Our billing staff will not be able to assist you with questions regarding bills from these companies.  You will be contacted with the lab results as soon as they are available. The fastest way to get your results is to activate your My Chart account. Instructions are located on the last page of this paperwork. If you have not heard from [place] regarding the results in 2 weeks, please contact this office.

## 2017-09-30 NOTE — Progress Notes (Signed)
09/30/2017 4:21 PM   DOB: 14-Apr-1966 / MRN: [id number]  SUBJECTIVE:  Alex Watson is a 52 y.o. male presenting for cough x 10 days.  Has already completed zpack starting day three of illness. Uncontrolled diabetic.  No chest pain, leg swelling.  20 pack year history of smoking. Getting worse.  Chest rads negative a previous UC visit for same problem.   He is allergic to morphine.   He  has a past medical history of Allergy, Diabetes mellitus without complication ([facility]), and Uncontrolled daytime somnolence (03/07/2013).    He  reports that he has been smoking cigarettes.  He has smoked for the past 24.00 years. He has never used smokeless tobacco. He reports that he drinks alcohol. He reports that he does not use drugs. He  reports that he currently engages in sexual activity. The patient  has a past surgical history that includes Mandible surgery (1987).  His family history includes Arthritis in his mother; Asthma in his mother; COPD in his father; Diabetes in his father; Heart disease in his sister; Lupus in his sister.  Review of Systems  Constitutional: Negative for diaphoresis.  Eyes: Negative.   Respiratory: Positive for cough, sputum production, shortness of breath and wheezing. Negative for hemoptysis.   Cardiovascular: Negative for chest pain, orthopnea and leg swelling.  Gastrointestinal: Negative for abdominal pain, blood in stool, constipation, diarrhea, heartburn, melena, nausea and vomiting.  Genitourinary: Negative for dysuria, flank pain, frequency, hematuria and urgency.  Neurological: Negative for dizziness, sensory change, speech change, focal weakness and headaches.    The problem list and medications were reviewed and updated by myself where necessary and exist elsewhere in the encounter.   OBJECTIVE:  BP 112/60   Pulse 84   Temp 99 F (37.2 C)   Resp 16   Ht  (1.93 m)   Wt 200 lb 12.8 oz (91.1 kg)   SpO2 96%   BMI 24.44 kg/m   Physical Exam    Constitutional: He is oriented to person, place, and time. He appears well-developed. He is active.  Non-toxic appearance. He does not appear ill.  Eyes: Pupils are equal, round, and reactive to light. Conjunctivae and EOM are normal.  Cardiovascular: Normal rate, regular rhythm, S1 normal, S2 normal, normal heart sounds, intact distal pulses and normal pulses. Exam reveals no gallop and no friction rub.  No murmur heard. Pulmonary/Chest: Effort normal. He has no rales.  Abdominal: He exhibits no distension.  Musculoskeletal: Normal range of motion. He exhibits no edema.  Neurological: He is alert and oriented to person, place, and time. No cranial nerve deficit. Coordination normal.  Skin: Skin is warm and dry. He is not diaphoretic. No pallor.  Psychiatric: He has a normal mood and affect.  Nursing note and vitals reviewed.   Results for orders placed or performed in visit on 09/30/17 (from the past 72 hour(s))  POCT CBC     Status: Abnormal   Collection Time: 09/30/17  3:46 PM  Result Value Ref Range   WBC 12.1 (A) 4.6 - 10.2 K/uL   Lymph, poc 2.4 0.6 - 3.4   POC LYMPH PERCENT 19.9 10 - 50 %L   MID (cbc) 0.8 0 - 0.9   POC MID % 6.2 0 - 12 %M   POC Granulocyte 8.9 (A) 2 - 6.9   Granulocyte percent 73.9 37 - 80 %G   RBC 5.39 4.69 - 6.13 M/uL   Hemoglobin 17.0 14.1 - 18.1 g/dL  HCT, POC 49.8 43.5 - 53.7 %   MCV 92.3 80 - 97 fL   MCH, POC 31.5 (A) 27 - 31.2 pg   MCHC 34.1 31.8 - 35.4 g/dL   RDW, POC 16.1 %   Platelet Count, POC 339 142 - 424 K/uL   MPV 7.4 0 - 99.8 fL   Lab Results  Component Value Date   HGBA1C 9.6 (A) 09/29/2017     No results found.  ASSESSMENT AND PLAN:  [name] was seen today for cough.  Diagnoses and all orders for this visit:  Wheeze: Most likely COPD given absence of asthma history. Complicated by poorly controlled diabetes. If this worsens we can move to pred however he will have to watch his sugar closely.  -     albuterol (PROVENTIL) (2.5  MG/3ML) 0.083% nebulizer solution 2.5 mg -     ipratropium (ATROVENT) nebulizer solution 0.5 mg  LRTI (lower respiratory tract infection) -     POCT CBC    The patient is advised to call or return to clinic if he does not see an improvement in symptoms, or to seek the care of the closest emergency department if he worsens with the above plan.   Deliah Boston, MHS, PA-C Primary [facility] at [facility] 09/30/2017 4:21 PM

## 2017-10-01 ENCOUNTER — Ambulatory Visit: Payer: Self-pay | Admitting: Endocrinology

## 2017-10-12 ENCOUNTER — Other Ambulatory Visit: Payer: Self-pay | Admitting: Physician Assistant

## 2017-10-13 NOTE — Telephone Encounter (Signed)
Naproxen 500 mg refill request  LOV 09/30/17 with Alex NeedleMichael Watson/   Has upcoming appt 11/01/17 9:40.    There were no refills on this at the 6/24 OV.  [id number] - [place], [place] - [address]

## 2017-10-14 ENCOUNTER — Other Ambulatory Visit: Payer: Self-pay | Admitting: Endocrinology

## 2017-11-01 ENCOUNTER — Ambulatory Visit: Payer: 59 | Admitting: Physician Assistant

## 2017-11-16 ENCOUNTER — Other Ambulatory Visit: Payer: Self-pay | Admitting: Endocrinology

## 2017-12-06 ENCOUNTER — Ambulatory Visit: Payer: 59 | Admitting: Endocrinology

## 2017-12-06 ENCOUNTER — Encounter: Payer: 59 | Attending: Endocrinology | Admitting: Nutrition

## 2017-12-06 ENCOUNTER — Encounter: Payer: Self-pay | Admitting: Endocrinology

## 2017-12-06 VITALS — BP 108/68 | HR 76 | Ht 76.0 in | Wt 211.0 lb

## 2017-12-06 DIAGNOSIS — E119 Type 2 diabetes mellitus without complications: Secondary | ICD-10-CM

## 2017-12-06 LAB — POCT GLYCOSYLATED HEMOGLOBIN (HGB A1C): Hemoglobin A1C: 9.6 % — AB (ref 4.0–5.6)

## 2017-12-06 MED ORDER — INSULIN ASPART 100 UNIT/ML ~~LOC~~ SOLN: 12.0000 [IU] | Freq: Every day | SUBCUTANEOUS | 11 refills | Status: DC

## 2017-12-06 MED ORDER — BASAGLAR KWIKPEN 100 UNIT/ML ~~LOC~~ SOPN: 26.0000 [IU] | PEN_INJECTOR | Freq: Two times a day (BID) | SUBCUTANEOUS | 5 refills | Status: DC

## 2017-12-06 MED ORDER — FREESTYLE LIBRE SENSOR SYSTEM MISC: 3 refills | Status: DC

## 2017-12-06 NOTE — Patient Instructions (Addendum)
check your blood sugar twice a day.  vary the time of day when you check, between before the 3 meals, and at bedtime.  also check if you have symptoms of your blood sugar being too high or too low.  please keep a record of the readings and bring it to your next appointment here.  You can write it on any piece of paper.  please call us sooner if your blood sugar goes below 70.   Please change the insulins to the numbers listed below.  On this type of insulin schedule, you should eat meals (or at least a snack), on a regular schedule.  If a meal is missed or significantly delayed, your blood sugar could go low.   Please come back for a follow-up appointment in 2 months.   

## 2017-12-06 NOTE — Progress Notes (Signed)
Subjective:    Patient ID: Alex Watson, male    DOB: Apr 06, 1966, 52 y.o.   MRN: [id number]  HPI Pt returns for f/u of diabetes mellitus: DM type: 1  Dx'ed: 4680 Complications: none.   Therapy: insulin since mid-2017.   DKA: never. Severe hypoglycemia: never.   Pancreatitis: never.  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   Interval history: He takes basaglar 26 units BID, and novolog 10 units with supper (he had to reduce due to hypoglycemia after supper).  I reviewed continuous glucose monitor data.  Glucose varies from 70-350.  It is in general highest at HS, and lowest in the afternoon.  He sometimes skips lunch Past Medical History:  Diagnosis Date  . Allergy   . Diabetes mellitus without complication ([place])   . Uncontrolled daytime somnolence 03/07/2013    Past Surgical History:  Procedure Laterality Date  . MANDIBLE SURGERY  1987    Social History   Socioeconomic History  . Marital status: Married    Spouse name: Not on file  . Number of children: Not on file  . Years of education: Not on file  . Highest education level: Not on file  Occupational History  . Not on file  Social Needs  . Financial resource strain: Not on file  . Food insecurity:    Worry: Not on file    Inability: Not on file  . Transportation needs:    Medical: Not on file    Non-medical: Not on file  Tobacco Use  . Smoking status: Current Every Day Smoker    Years: 24.00    Types: Cigarettes  . Smokeless tobacco: Never Used  Substance and Sexual Activity  . Alcohol use: Yes    Comment: BEER  . Drug use: No  . Sexual activity: Yes    Comment: number of sex partners in the last 12 months 1  Lifestyle  . Physical activity:    Days per week: Not on file    Minutes per session: Not on file  . Stress: Not on file    Relationships  . Social connections:    Talks on phone: Not on file    Gets together: Not on file    Attends religious service: Not on file    Active member of club or organization: Not on file    Attends meetings of clubs or organizations: Not on file    Relationship status: Not on file  . Intimate partner violence:    Fear of current or ex partner: Not on file    Emotionally abused: Not on file    Physically abused: Not on file    Forced sexual activity: Not on file  Other Topics Concern  . Not on file  Social History Narrative  . Not on file    Current Outpatient Medications on File Prior to Visit  Medication Sig Dispense Refill  . albuterol (PROVENTIL HFA;VENTOLIN HFA) 108 (90 Base) MCG/ACT inhaler INHALE 1 PUFF PO Q 4 TO 6 H PRN  0  . azithromycin (ZITHROMAX) 250 MG tablet   0  . benzonatate (TESSALON) 200 MG capsule Take 1 capsule (200 mg total) by mouth 2 (two) times daily as needed for cough. (Patient not taking: Reported on 12/06/2017) 20 capsule 0  . blood glucose meter kit  and supplies KIT Dispense based on patient and insurance preference. Use up to four times daily as directed. (FOR ICD-9 250.00, 250.01). 1 each 0  . Continuous Blood Gluc Receiver (FREESTYLE LIBRE 14 DAY READER) DEVI USE AS DIRECTED 1 Device 0  . diclofenac (VOLTAREN) 75 MG EC tablet Take 1 tablet (75 mg total) by mouth 2 (two) times daily. (Patient not taking: Reported on 09/30/2017) 20 tablet 3  . glucose blood (ONE TOUCH ULTRA TEST) test strip TEST four times a day 100 each 3  . ibuprofen (ADVIL,MOTRIN) 800 MG tablet Take 1 tablet (800 mg total) by mouth 3 (three) times daily. (Patient not taking: Reported on 09/29/2017) 90 tablet 0  . Insulin Syringe-Needle U-100 (INSULIN SYRINGE 1CC/30GX1/2") 30G X 1/2" 1 ML MISC Use to inject insulin 2 times per day. 200 each 2  . methocarbamol (ROBAXIN) 500 MG tablet Take 1 in the morning, 1 in the afternoon, and 2 at bedtime for muscle relaxant (Patient not taking:  Reported on 09/30/2017) 40 tablet 3  . naproxen (NAPROSYN) 500 MG tablet Take 1 tablet (500 mg total) by mouth 2 (two) times daily with a meal. (Patient not taking: Reported on 12/06/2017) 30 tablet 0  . ONETOUCH DELICA LANCETS FINE MISC TEST 1-4 TIMES PER DAY FOR GLUCOSE CHECK 300 each 3  . traMADol (ULTRAM) 50 MG tablet Take 1 tablet (50 mg total) by mouth every 8 (eight) hours as needed. (Patient not taking: Reported on 09/30/2017) 20 tablet 3   No current facility-administered medications on file prior to visit.     Allergies  Allergen Reactions  . Morphine     REACTION: difficulty breathing    Family History  Problem Relation Age of Onset  . Asthma Mother   . Arthritis Mother   . COPD Father   . Heart disease Sister        eisenberger syndrome  . Lupus Sister   . Diabetes Father     BP 108/68 (BP Location: Left Arm, Patient Position: Sitting, Cuff Size: Normal)   Pulse 76   Ht _0  (1.93 m)   Wt 211 lb (95.7 kg)   SpO2 99%   BMI 25.68 kg/m    Review of Systems Denies LOC.      Objective:   Physical Exam VITAL SIGNS:  See vs page.  GENERAL: no distress.  Pulses: foot pulses are intact bilaterally.   MSK: no deformity of the feet or ankles.  CV: no edema of the legs or ankles.  Skin:  no ulcer on the feet or ankles.  normal color and temp on the feet and ankles Neuro: sensation is intact to touch on the feet and ankles.   Ext: right great toenail is absent.  Lab Results  Component Value Date   HGBA1C 9.6 (A) 12/06/2017        Assessment & Plan:  type 1 DM: The pattern of his cbg's indicates he needs some adjustment in his therapy.    Patient Instructions  check your blood sugar twice a day.  vary the time of day when you check, between before the 3 meals, and at bedtime.  also check if you have symptoms of your blood sugar being too high or too low.  please keep a record of the readings and bring it to your next appointment here.  You can write it on any  piece of paper.  please call us sooner if your blood sugar goes below 70.   Please change the  insulins to the numbers listed below.  On this type of insulin schedule, you should eat meals (or at least a snack), on a regular schedule.  If a meal is missed or significantly delayed, your blood sugar could go low.   Please come back for a follow-up appointment in 2 months.

## 2017-12-06 NOTE — Progress Notes (Signed)
Patient was questioning how to keep his insulin cold for 12 hours of working in the heat.  He is also having difficulty with high readings after is one meal /day, which is supper.  Libre sensor shows that readings are over 300 for several hours after the supper meal.  He is eating out every night--he can not change this, and we discussed what was needed when doing this.  Also discussed carb counting some, so that he can get a better idea of how much meal time insulin he would need, plus extra for high fat meals.  He is not interested in doing this at this time. discussed how to determine a correct insulin dose--by testing 2hr. pc with readings at 180, and to increase by 1-2u q 1-2 days until the 2hr. pc reading is 180 or less.  He reported good understanding of this with no final questions.

## 2017-12-06 NOTE — Patient Instructions (Signed)
Increase meal time insulin by 2u.

## 2018-01-12 ENCOUNTER — Other Ambulatory Visit: Payer: Self-pay | Admitting: Endocrinology

## 2018-02-01 ENCOUNTER — Other Ambulatory Visit: Payer: Self-pay

## 2018-02-01 ENCOUNTER — Other Ambulatory Visit: Payer: Self-pay | Admitting: Endocrinology

## 2018-02-01 MED ORDER — INSULIN LISPRO 100 UNIT/ML ~~LOC~~ SOLN: SUBCUTANEOUS | 2 refills | Status: DC

## 2018-02-11 ENCOUNTER — Encounter: Payer: Self-pay | Admitting: Endocrinology

## 2018-02-11 ENCOUNTER — Ambulatory Visit: Payer: 59 | Admitting: Endocrinology

## 2018-02-11 VITALS — BP 118/68 | HR 80 | Ht 76.0 in | Wt 208.0 lb

## 2018-02-11 DIAGNOSIS — E119 Type 2 diabetes mellitus without complications: Secondary | ICD-10-CM | POA: Diagnosis not present

## 2018-02-11 LAB — POCT GLYCOSYLATED HEMOGLOBIN (HGB A1C): HEMOGLOBIN A1C: 9 % — AB (ref 4.0–5.6)

## 2018-02-11 MED ORDER — INSULIN LISPRO 100 UNIT/ML ~~LOC~~ SOLN: 18.0000 [IU] | Freq: Every day | SUBCUTANEOUS | 2 refills | Status: DC

## 2018-02-11 MED ORDER — BASAGLAR KWIKPEN 100 UNIT/ML ~~LOC~~ SOPN: 25.0000 [IU] | PEN_INJECTOR | Freq: Two times a day (BID) | SUBCUTANEOUS | 5 refills | Status: DC

## 2018-02-11 NOTE — Progress Notes (Signed)
Subjective:    Patient ID: Alex Watson, male    DOB: Jun 29, 1965, 52 y.o.   MRN: [id number]  HPI Pt returns for f/u of diabetes mellitus: DM type: 1  Dx'ed: 9826 Complications: none.   Therapy: insulin since mid-2017.   DKA: never. Severe hypoglycemia: never.   Pancreatitis: never.  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   Interval history: continuous glucose monitor is downloaded today, and the printout is scanned into the record.  Glucose varies from 70-315.  It is in general highest at [facility] (he has been working in [place], so time is different).  He reports mild hypoglycemia approx twice a month.  This happens at any time of day, but usually in the afternoon.   Past Medical History:  Diagnosis Date  . Allergy   . Diabetes mellitus without complication ([place])   . Uncontrolled daytime somnolence 03/07/2013    Past Surgical History:  Procedure Laterality Date  . MANDIBLE SURGERY  1987    Social History   Socioeconomic History  . Marital status: Married    Spouse name: Not on file  . Number of children: Not on file  . Years of education: Not on file  . Highest education level: Not on file  Occupational History  . Not on file  Social Needs  . Financial resource strain: Not on file  . Food insecurity:    Worry: Not on file    Inability: Not on file  . Transportation needs:    Medical: Not on file    Non-medical: Not on file  Tobacco Use  . Smoking status: Current Every Day Smoker    Years: 24.00    Types: Cigarettes  . Smokeless tobacco: Never Used  Substance and Sexual Activity  . Alcohol use: Yes    Comment: BEER  . Drug use: No  . Sexual activity: Yes    Comment: number of sex partners in the last 12 months 1  Lifestyle  . Physical activity:    Days per week: Not on file    Minutes per  session: Not on file  . Stress: Not on file  Relationships  . Social connections:    Talks on phone: Not on file    Gets together: Not on file    Attends religious service: Not on file    Active member of club or organization: Not on file    Attends meetings of clubs or organizations: Not on file    Relationship status: Not on file  . Intimate partner violence:    Fear of current or ex partner: Not on file    Emotionally abused: Not on file    Physically abused: Not on file    Forced sexual activity: Not on file  Other Topics Concern  . Not on file  Social History Narrative  . Not on file    Current Outpatient Medications on File Prior to Visit  Medication Sig Dispense Refill  . albuterol (PROVENTIL HFA;VENTOLIN HFA) 108 (90 Base) MCG/ACT inhaler INHALE 1 PUFF PO Q 4 TO 6 H PRN  0  . blood glucose meter kit and supplies KIT Dispense based on patient and insurance preference. Use up to four times daily as directed. (FOR ICD-9 250.00, 250.01). 1 each 0  . Continuous Blood Gluc Receiver (FREESTYLE [place]  14 DAY READER) DEVI USE AS DIRECTED 1 Device 0  . Continuous Blood Gluc Sensor (FREESTYLE LIBRE 14 DAY SENSOR) MISC USE AS DIRECTED THREE TIMES DAILY AS NEEDED 2 each 0  . Continuous Blood Gluc Sensor (FREESTYLE LIBRE SENSOR SYSTEM) MISC 1 device, every 10 days 10 each 3  . glucose blood (ONE TOUCH ULTRA TEST) test strip TEST four times a day 100 each 3  . ibuprofen (ADVIL,MOTRIN) 800 MG tablet Take 1 tablet (800 mg total) by mouth 3 (three) times daily. 90 tablet 0  . Insulin Syringe-Needle U-100 (INSULIN SYRINGE 1CC/30GX1/2") 30G X 1/2" 1 ML MISC Use to inject insulin 2 times per day. 200 each 2  . methocarbamol (ROBAXIN) 500 MG tablet Take 1 in the morning, 1 in the afternoon, and 2 at bedtime for muscle relaxant 40 tablet 3  . ONETOUCH DELICA LANCETS FINE MISC TEST 1-4 TIMES PER DAY FOR GLUCOSE CHECK 300 each 3  . traMADol (ULTRAM) 50 MG tablet Take 1 tablet (50 mg total) by mouth  every 8 (eight) hours as needed. 20 tablet 3   No current facility-administered medications on file prior to visit.     Allergies  Allergen Reactions  . Morphine     REACTION: difficulty breathing    Family History  Problem Relation Age of Onset  . Asthma Mother   . Arthritis Mother   . COPD Father   . Heart disease Sister        eisenberger syndrome  . Lupus Sister   . Diabetes Father     BP 118/68 (BP Location: Left Arm)   Pulse 80   Ht _0  (1.93 m)   Wt 208 lb (94.3 kg)   SpO2 97%   BMI 25.32 kg/m   Review of Systems No LOC    Objective:   Physical Exam VITAL SIGNS:  See vs page GENERAL: no distress Pulses: dorsalis pedis intact bilat.   MSK: no deformity of the feet CV: no leg edema Skin:  no ulcer on the feet.  normal color and temp on the feet. Neuro: sensation is intact to touch on the feet Ext: There is onychomycosis of the right great toenail.     Lab Results  Component Value Date   HGBA1C 9.0 (A) 02/11/2018      Assessment & Plan:  Type 1 DM: he needs increased rx Occupational status: this continues to compromise glycemic control  Patient Instructions  check your blood sugar twice a day.  vary the time of day when you check, between before the 3 meals, and at bedtime.  also check if you have symptoms of your blood sugar being too high or too low.  please keep a record of the readings and bring it to your next appointment here.  You can write it on any piece of paper.  please call us sooner if your blood sugar goes below 70.   Please change the insulins to the numbers listed below.  On this type of insulin schedule, you should eat meals (or at least a snack), on a regular schedule.  If a meal is missed or significantly delayed, your blood sugar could go low.   Please come back for a follow-up appointment in 2 months.

## 2018-02-11 NOTE — Patient Instructions (Addendum)
check your blood sugar twice a day.  vary the time of day when you check, between before the 3 meals, and at bedtime.  also check if you have symptoms of your blood sugar being too high or too low.  please keep a record of the readings and bring it to your next appointment here.  You can write it on any piece of paper.  please call us sooner if your blood sugar goes below 70.   Please change the insulins to the numbers listed below.  On this type of insulin schedule, you should eat meals (or at least a snack), on a regular schedule.  If a meal is missed or significantly delayed, your blood sugar could go low.   Please come back for a follow-up appointment in 2 months.   

## 2018-04-05 ENCOUNTER — Ambulatory Visit: Payer: 59 | Admitting: Endocrinology

## 2018-04-05 ENCOUNTER — Encounter: Payer: Self-pay | Admitting: Endocrinology

## 2018-04-05 VITALS — BP 120/78 | HR 70 | Ht 76.0 in | Wt 211.2 lb

## 2018-04-05 DIAGNOSIS — Z23 Encounter for immunization: Secondary | ICD-10-CM | POA: Diagnosis not present

## 2018-04-05 DIAGNOSIS — E119 Type 2 diabetes mellitus without complications: Secondary | ICD-10-CM | POA: Diagnosis not present

## 2018-04-05 LAB — POCT GLYCOSYLATED HEMOGLOBIN (HGB A1C): HEMOGLOBIN A1C: 8.2 % — AB (ref 4.0–5.6)

## 2018-04-05 NOTE — Progress Notes (Signed)
Subjective:    Patient ID: Alex Watson, male    DOB: [birth year], 52 y.o.   MRN: [id number]  HPI Pt returns for f/u of diabetes mellitus: DM type: 1  Dx'ed: 0321 Complications: none.   Therapy: insulin since mid-2017.   DKA: never. Severe hypoglycemia: never.   Pancreatitis: never.  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   Interval history: We downloaded continuous glucose monitor, and reviewed data together.  Glucose varies from 50-400.  It is in general highest and most variable at midnight.  Pt says he eats evening meal at 8 PM.  Pt says he is uncertain why it varies so much at midnight.  On his current job assignment, he works 4 AM-8 PM. Past Medical History:  Diagnosis Date  . Allergy   . Diabetes mellitus without complication ([place])   . Uncontrolled daytime somnolence 03/07/2013    Past Surgical History:  Procedure Laterality Date  . MANDIBLE SURGERY  1987    Social History   Socioeconomic History  . Marital status: Married    Spouse name: Not on file  . Number of children: Not on file  . Years of education: Not on file  . Highest education level: Not on file  Occupational History  . Not on file  Social Needs  . Financial resource strain: Not on file  . Food insecurity:    Worry: Not on file    Inability: Not on file  . Transportation needs:    Medical: Not on file    Non-medical: Not on file  Tobacco Use  . Smoking status: Current Every Day Smoker    Years: 24.00    Types: Cigarettes  . Smokeless tobacco: Never Used  Substance and Sexual Activity  . Alcohol use: Yes    Comment: BEER  . Drug use: No  . Sexual activity: Yes    Comment: number of sex partners in the last 12 months 1  Lifestyle  . Physical activity:    Days per week: Not on file    Minutes per session: Not on file   . Stress: Not on file  Relationships  . Social connections:    Talks on phone: Not on file    Gets together: Not on file    Attends religious service: Not on file    Active member of club or organization: Not on file    Attends meetings of clubs or organizations: Not on file    Relationship status: Not on file  . Intimate partner violence:    Fear of current or ex partner: Not on file    Emotionally abused: Not on file    Physically abused: Not on file    Forced sexual activity: Not on file  Other Topics Concern  . Not on file  Social History Narrative  . Not on file    Current Outpatient Medications on File Prior to Visit  Medication Sig Dispense Refill  . albuterol (PROVENTIL HFA;VENTOLIN HFA) 108 (90 Base) MCG/ACT inhaler INHALE 1 PUFF PO Q 4 TO 6 H PRN  0  . blood glucose meter kit and supplies KIT Dispense based on patient and insurance preference. Use up to four times daily as directed. (FOR ICD-9 250.00, 250.01). 1 each 0  . Continuous Blood Gluc Receiver (FREESTYLE LIBRE 14 DAY  READER) DEVI USE AS DIRECTED 1 Device 0  . Continuous Blood Gluc Sensor (FREESTYLE LIBRE 14 DAY SENSOR) MISC USE AS DIRECTED THREE TIMES DAILY AS NEEDED 2 each 0  . glucose blood (ONE TOUCH ULTRA TEST) test strip TEST four times a day 100 each 3  . ibuprofen (ADVIL,MOTRIN) 800 MG tablet Take 1 tablet (800 mg total) by mouth 3 (three) times daily. 90 tablet 0  . Insulin Glargine (BASAGLAR KWIKPEN) 100 UNIT/ML SOPN Inject 0.25 mLs (25 Units total) into the skin 2 (two) times daily. And pen needles 1/day 20 pen 5  . insulin lispro (HUMALOG) 100 UNIT/ML injection Inject 0.18 mLs (18 Units total) into the skin daily with supper. 20 mL 2  . Insulin Syringe-Needle U-100 (INSULIN SYRINGE 1CC/30GX1/2") 30G X 1/2" 1 ML MISC Use to inject insulin 2 times per day. 200 each 2  . methocarbamol (ROBAXIN) 500 MG tablet Take 1 in the morning, 1 in the afternoon, and 2 at bedtime for muscle relaxant 40 tablet 3  .  ONETOUCH DELICA LANCETS FINE MISC TEST 1-4 TIMES PER DAY FOR GLUCOSE CHECK 300 each 3  . traMADol (ULTRAM) 50 MG tablet Take 1 tablet (50 mg total) by mouth every 8 (eight) hours as needed. 20 tablet 3   No current facility-administered medications on file prior to visit.     Allergies  Allergen Reactions  . Morphine     REACTION: difficulty breathing    Family History  Problem Relation Age of Onset  . Asthma Mother   . Arthritis Mother   . COPD Father   . Heart disease Sister        eisenberger syndrome  . Lupus Sister   . Diabetes Father     BP 120/78 (BP Location: Right Arm, Patient Position: Sitting, Cuff Size: Normal)   Pulse 70   Ht _0  (1.93 m)   Wt 211 lb 3.2 oz (95.8 kg)   SpO2 93%   BMI 25.71 kg/m    Review of Systems Denies LOC    Objective:   Physical Exam VITAL SIGNS:  See vs page GENERAL: no distress Pulses: dorsalis pedis intact bilat.   MSK: no deformity of the feet CV: no leg edema Skin:  no ulcer on the feet.  normal color and temp on the feet. Neuro: sensation is intact to touch on the feet  A1c=8.2%     Assessment & Plan:  Type 1 DM: he needs increased rx.   Hypoglycemia: as glucose is so variable at HS, I asked pt for more info about this.   Occupational status: we discussed timing of meals. Pt says this does not appear to be causing variable HS cbg's.    Patient Instructions  check your blood sugar twice a day.  vary the time of day when you check, between before the 3 meals, and at bedtime.  also check if you have symptoms of your blood sugar being too high or too low.  please keep a record of the readings and bring it to your next appointment here.  You can write it on any piece of paper.  please call us sooner if your blood sugar goes below 70.   Please continue the same insulins for now. Please try to figure out why it is varies so much at midnight, and let me know next time.    On this type of insulin schedule, you should eat meals  (or at least a snack), on a regular schedule.  If  a meal is missed or significantly delayed, your blood sugar could go low.   Please come back for a follow-up appointment in 2 months.

## 2018-04-05 NOTE — Patient Instructions (Addendum)
check your blood sugar twice a day.  vary the time of day when you check, between before the 3 meals, and at bedtime.  also check if you have symptoms of your blood sugar being too high or too low.  please keep a record of the readings and bring it to your next appointment here.  You can write it on any piece of paper.  please call us sooner if your blood sugar goes below 70.   Please continue the same insulins for now. Please try to figure out why it is varies so much at midnight, and let me know next time.    On this type of insulin schedule, you should eat meals (or at least a snack), on a regular schedule.  If a meal is missed or significantly delayed, your blood sugar could go low.   Please come back for a follow-up appointment in 2 months.

## 2018-04-15 ENCOUNTER — Ambulatory Visit: Payer: Self-pay | Admitting: Endocrinology

## 2018-04-18 ENCOUNTER — Telehealth: Payer: Self-pay | Admitting: Endocrinology

## 2018-04-18 ENCOUNTER — Other Ambulatory Visit: Payer: Self-pay

## 2018-04-18 DIAGNOSIS — E119 Type 2 diabetes mellitus without complications: Secondary | ICD-10-CM

## 2018-04-18 MED ORDER — FREESTYLE LIBRE 14 DAY SENSOR MISC: 1.0000 | Freq: Every day | 11 refills | Status: DC

## 2018-04-18 NOTE — Telephone Encounter (Signed)
Request authorized and refill sent as requested.  

## 2018-04-18 NOTE — Telephone Encounter (Signed)
MEDICATION: Continuous Blood Gluc Sensor (FREESTYLE LIBRE 14 DAY SENSOR) MISC  PHARMACY:  [phone][facility][id number] - [place], [place]   IS THIS A 90 DAY SUPPLY : No. But would like a 90 day supply if it is covered by insurance.  IS PATIENT OUT OF MEDICATION: Yes  IF NOT; HOW MUCH IS LEFT:   LAST APPOINTMENT DATE: @11 /26/2019  NEXT APPOINTMENT DATE:@1 /31/2020  DO WE HAVE YOUR PERMISSION TO LEAVE A DETAILED MESSAGE: Yes  OTHER COMMENTS:    **Let patient know to contact pharmacy at the end of the day to make sure medication is ready. **  ** Please notify patient to allow 48-72 hours to process**  **Encourage patient to contact the pharmacy for refills or they can [facility]**

## 2018-05-02 ENCOUNTER — Other Ambulatory Visit: Payer: Self-pay | Admitting: Endocrinology

## 2018-05-02 DIAGNOSIS — E119 Type 2 diabetes mellitus without complications: Secondary | ICD-10-CM

## 2018-06-10 ENCOUNTER — Ambulatory Visit: Payer: Self-pay | Admitting: Endocrinology

## 2018-06-20 ENCOUNTER — Encounter: Payer: Self-pay | Admitting: Endocrinology

## 2018-06-20 ENCOUNTER — Ambulatory Visit: Payer: 59 | Admitting: Endocrinology

## 2018-06-20 VITALS — BP 122/80 | HR 78 | Ht 76.0 in | Wt 217.8 lb

## 2018-06-20 DIAGNOSIS — E119 Type 2 diabetes mellitus without complications: Secondary | ICD-10-CM

## 2018-06-20 LAB — POCT GLYCOSYLATED HEMOGLOBIN (HGB A1C): HEMOGLOBIN A1C: 8.7 % — AB (ref 4.0–5.6)

## 2018-06-20 MED ORDER — INSULIN LISPRO 100 UNIT/ML ~~LOC~~ SOLN: 14.0000 [IU] | Freq: Every day | SUBCUTANEOUS | 2 refills | Status: DC

## 2018-06-20 MED ORDER — FREESTYLE LIBRE 14 DAY SENSOR MISC: 1.0000 | 11 refills | Status: DC

## 2018-06-20 NOTE — Patient Instructions (Addendum)
check your blood sugar twice a day.  vary the time of day when you check, between before the 3 meals, and at bedtime.  also check if you have symptoms of your blood sugar being too high or too low.  please keep a record of the readings and bring it to your next appointment here.  You can write it on any piece of paper.  please call us sooner if your blood sugar goes below 70.   Please continue the same basaglar, and: Increase the humalog to 14 units with supper.   On this type of insulin schedule, you should eat meals (or at least a snack), on a regular schedule.  If a meal is missed or significantly delayed, your blood sugar could go low.   Please come back for a follow-up appointment in 2 months.

## 2018-06-20 NOTE — Progress Notes (Signed)
Subjective:    Patient ID: Alex Watson, adult    DOB: June 12, 1965, 53 y.o.   MRN: [id number]  HPI Pt returns for f/u of diabetes mellitus: DM type: 1  Dx'ed: 6384 Complications: none.   Therapy: insulin since mid-2017.   DKA: never. Severe hypoglycemia: never.   Pancreatitis: never.  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   Interval history: We downloaded continuous glucose monitor, and reviewed data together.  Glucose varies from 60-400.  It is in general highest at HS.  pt states he feels well in general.  he takes basaglar, 25 units BID, and humalog, 11 units with supper.   Past Medical History:  Diagnosis Date  . Allergy   . Diabetes mellitus without complication ([place])   . Uncontrolled daytime somnolence 03/07/2013    Past Surgical History:  Procedure Laterality Date  . MANDIBLE SURGERY  1987    Social History   Socioeconomic History  . Marital status: Married    Spouse name: Not on file  . Number of children: Not on file  . Years of education: Not on file  . Highest education level: Not on file  Occupational History  . Not on file  Social Needs  . Financial resource strain: Not on file  . Food insecurity:    Worry: Not on file    Inability: Not on file  . Transportation needs:    Medical: Not on file    Non-medical: Not on file  Tobacco Use  . Smoking status: Current Every Day Smoker    Years: 24.00    Types: Cigarettes  . Smokeless tobacco: Never Used  Substance and Sexual Activity  . Alcohol use: Yes    Comment: BEER  . Drug use: No  . Sexual activity: Yes    Comment: number of sex partners in the last 12 months 1  Lifestyle  . Physical activity:    Days per week: Not on file    Minutes per session: Not on file  . Stress: Not on file  Relationships  . Social connections:      Talks on phone: Not on file    Gets together: Not on file    Attends religious service: Not on file    Active member of club or organization: Not on file    Attends meetings of clubs or organizations: Not on file    Relationship status: Not on file  . Intimate partner violence:    Fear of current or ex partner: Not on file    Emotionally abused: Not on file    Physically abused: Not on file    Forced sexual activity: Not on file  Other Topics Concern  . Not on file  Social History Narrative  . Not on file    Current Outpatient Medications on File Prior to Visit  Medication Sig Dispense Refill  . albuterol (PROVENTIL HFA;VENTOLIN HFA) 108 (90 Base) MCG/ACT inhaler INHALE 1 PUFF PO Q 4 TO 6 H PRN  0  . blood glucose meter kit and supplies KIT Dispense based on patient and insurance preference. Use up to four times daily as directed. (FOR ICD-9 250.00, 250.01). 1 each 0  . Continuous Blood Gluc Receiver (FREESTYLE LIBRE 14 DAY READER) DEVI USE AS DIRECTED 1 Device 0  . glucose blood (ONE  TOUCH ULTRA TEST) test strip TEST four times a day 100 each 3  . ibuprofen (ADVIL,MOTRIN) 800 MG tablet Take 1 tablet (800 mg total) by mouth 3 (three) times daily. 90 tablet 0  . Insulin Glargine (BASAGLAR KWIKPEN) 100 UNIT/ML SOPN Inject 0.25 mLs (25 Units total) into the skin 2 (two) times daily. And pen needles 1/day 20 pen 5  . Insulin Syringe-Needle U-100 (INSULIN SYRINGE 1CC/30GX1/2") 30G X 1/2" 1 ML MISC Use to inject insulin 2 times per day. 200 each 2  . methocarbamol (ROBAXIN) 500 MG tablet Take 1 in the morning, 1 in the afternoon, and 2 at bedtime for muscle relaxant 40 tablet 3  . ONETOUCH DELICA LANCETS FINE MISC TEST 1-4 TIMES PER DAY FOR GLUCOSE CHECK 300 each 3  . traMADol (ULTRAM) 50 MG tablet Take 1 tablet (50 mg total) by mouth every 8 (eight) hours as needed. 20 tablet 3   No current facility-administered medications on file prior to visit.     Allergies  Allergen Reactions   . Morphine     REACTION: difficulty breathing    Family History  Problem Relation Age of Onset  . Asthma Mother   . Arthritis Mother   . COPD Father   . Heart disease Sister        eisenberger syndrome  . Lupus Sister   . Diabetes Father     BP 122/80 (BP Location: Left Arm, Patient Position: Sitting, Cuff Size: Normal)   Pulse 78   Ht 6' 4" (1.93 m)   Wt 217 lb 12.8 oz (98.8 kg)   SpO2 97%   BMI 26.51 kg/m    Review of Systems Denies LOC.      Objective:   Physical Exam VITAL SIGNS:  See vs page GENERAL: no distress Pulses: dorsalis pedis intact bilat.   MSK: no deformity of the feet CV: no leg edema Skin:  no ulcer on the feet.  normal color and temp on the feet. Neuro: sensation is intact to touch on the feet.   Ext: There is bilateral onychomycosis of the toenails.  Lab Results  Component Value Date   HGBA1C 8.7 (A) 06/20/2018       Assessment & Plan:  Type 1 DM: worse Occupational status: this is complicating the rx of DM.  We discussed timing of meals.  Hypoglycemia: this is limiting aggressiveness of glycemic control.   Patient Instructions  check your blood sugar twice a day.  vary the time of day when you check, between before the 3 meals, and at bedtime.  also check if you have symptoms of your blood sugar being too high or too low.  please keep a record of the readings and bring it to your next appointment here.  You can write it on any piece of paper.  please call us sooner if your blood sugar goes below 70.   Please continue the same basaglar, and: Increase the humalog to 14 units with supper.   On this type of insulin schedule, you should eat meals (or at least a snack), on a regular schedule.  If a meal is missed or significantly delayed, your blood sugar could go low.   Please come back for a follow-up appointment in 2 months.

## 2018-07-03 ENCOUNTER — Other Ambulatory Visit: Payer: Self-pay | Admitting: Endocrinology

## 2018-07-11 ENCOUNTER — Telehealth: Payer: Self-pay | Admitting: Endocrinology

## 2018-07-11 MED ORDER — INSULIN ASPART 100 UNIT/ML ~~LOC~~ SOLN: 14.0000 [IU] | Freq: Every day | SUBCUTANEOUS | 11 refills | Status: DC

## 2018-07-11 NOTE — Telephone Encounter (Signed)
Ok, I have sent a prescription to your pharmacy, to change 

## 2018-07-11 NOTE — Telephone Encounter (Signed)
Please advise 

## 2018-07-11 NOTE — Telephone Encounter (Signed)
Called pt and made him aware. Verbalized acceptance and understanding. 

## 2018-07-11 NOTE — Telephone Encounter (Signed)
Patient's wife called re: pharmacy has faxed clarification issue re: Novolog to our office. Please contact Rinaldo Cloud at ph# [phone] if form was not received or for status of RX. May leave detailed message on voice mail.

## 2018-07-11 NOTE — Telephone Encounter (Signed)
Returned Alex Watson's call re: need for clarification of her request. Left detailed message requesting returned call.

## 2018-07-11 NOTE — Telephone Encounter (Signed)
Patient's wife called re: patient's insurance is no longer covering Humalog but WILL cover Novolog 70/30/20 in vials. Patient's wife wants to know if patient can switch to the Novolog listed above. Patient is out of medication.Pharmacy is [facility] on [address].

## 2018-09-02 ENCOUNTER — Ambulatory Visit: Payer: Self-pay | Admitting: Endocrinology

## 2018-09-13 ENCOUNTER — Telehealth: Payer: Self-pay

## 2018-09-13 NOTE — Telephone Encounter (Signed)
Overdue for an appt. LVM requesting returned call. 

## 2018-09-14 ENCOUNTER — Encounter: Payer: Self-pay | Admitting: Endocrinology

## 2018-09-15 ENCOUNTER — Ambulatory Visit (INDEPENDENT_AMBULATORY_CARE_PROVIDER_SITE_OTHER): Payer: 59 | Admitting: Endocrinology

## 2018-09-15 DIAGNOSIS — E119 Type 2 diabetes mellitus without complications: Secondary | ICD-10-CM

## 2018-09-15 MED ORDER — INSULIN ASPART 100 UNIT/ML ~~LOC~~ SOLN: 14.0000 [IU] | Freq: Every day | SUBCUTANEOUS | 11 refills | Status: DC

## 2018-09-15 MED ORDER — FREESTYLE LIBRE 14 DAY SENSOR MISC: 1.0000 | 3 refills | Status: DC

## 2018-09-15 MED ORDER — BASAGLAR KWIKPEN 100 UNIT/ML ~~LOC~~ SOPN: 25.0000 [IU] | PEN_INJECTOR | Freq: Two times a day (BID) | SUBCUTANEOUS | 5 refills | Status: DC

## 2018-09-15 NOTE — Progress Notes (Signed)
Subjective:    Patient ID: Alex Watson, male    DOB: 11/05/1965, 53 y.o.   MRN: [id number]  HPI  telehealth visit today via doxy video visit.  Alternatives to telehealth are presented to this patient, and the patient agrees to the telehealth visit. Pt is advised of the cost of the visit, and agrees to this, also.   Patient is at home, and I am at the office.   Persons attending the telehealth visit: Pt is at work [facility], [place]), and I am in the office Pt returns for f/u of diabetes mellitus: DM type: 1  Dx'ed: 2836 Complications: none.   Therapy: insulin since mid-2017.   DKA: never. Severe hypoglycemia: never.   Pancreatitis: never.  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   Interval history: pt says glucose varies from 70-300, but most are in the 100's.  There is no trend throughout the day, except it is higher at HS than fasting.  It is lowest in the afternoon.  pt states he feels well in general.   Past Medical History:  Diagnosis Date  . Allergy   . Diabetes mellitus without complication ([place])   . Uncontrolled daytime somnolence 03/07/2013    Past Surgical History:  Procedure Laterality Date  . MANDIBLE SURGERY  1987    Social History   Socioeconomic History  . Marital status: Married    Spouse name: Not on file  . Number of children: Not on file  . Years of education: Not on file  . Highest education level: Not on file  Occupational History  . Not on file  Social Needs  . Financial resource strain: Not on file  . Food insecurity:    Worry: Not on file    Inability: Not on file  . Transportation needs:    Medical: Not on file    Non-medical: Not on file  Tobacco Use  . Smoking status: Current Every Day Smoker    Years: 24.00    Types: Cigarettes  . Smokeless tobacco: Never Used  Substance and Sexual  Activity  . Alcohol use: Yes    Comment: BEER  . Drug use: No  . Sexual activity: Yes    Comment: number of sex partners in the last 12 months 1  Lifestyle  . Physical activity:    Days per week: Not on file    Minutes per session: Not on file  . Stress: Not on file  Relationships  . Social connections:    Talks on phone: Not on file    Gets together: Not on file    Attends religious service: Not on file    Active member of club or organization: Not on file    Attends meetings of clubs or organizations: Not on file    Relationship status: Not on file  . Intimate partner violence:    Fear of current or ex partner: Not on file    Emotionally abused: Not on file    Physically abused: Not on file    Forced sexual activity: Not on file  Other Topics Concern  . Not on file  Social History Narrative  . Not on file    Current Outpatient Medications on File Prior to Visit  Medication Sig Dispense Refill  . albuterol (PROVENTIL HFA;VENTOLIN HFA) 108 (90 Base) MCG/ACT inhaler INHALE  1 PUFF PO Q 4 TO 6 H PRN  0  . blood glucose meter kit and supplies KIT Dispense based on patient and insurance preference. Use up to four times daily as directed. (FOR ICD-9 250.00, 250.01). 1 each 0  . Continuous Blood Gluc Receiver (FREESTYLE LIBRE 14 DAY READER) DEVI USE AS DIRECTED 1 Device 0  . glucose blood (ONE TOUCH ULTRA TEST) test strip TEST four times a day 100 each 3  . ibuprofen (ADVIL,MOTRIN) 800 MG tablet Take 1 tablet (800 mg total) by mouth 3 (three) times daily. 90 tablet 0  . Insulin Syringe-Needle U-100 (B-D INS SYR ULTRAFINE 1CC/30G) 30G X 1/2" 1 ML MISC USE AS DIRECTED TWICE DAILY 200 each 2  . Insulin Syringe-Needle U-100 (B-D INS SYR ULTRAFINE 1CC/30G) 30G X 1/2" 1 ML MISC USE AS DIRECTED TWICE DAILY 200 each 2  . methocarbamol (ROBAXIN) 500 MG tablet Take 1 in the morning, 1 in the afternoon, and 2 at bedtime for muscle relaxant 40 tablet 3  . ONETOUCH DELICA LANCETS FINE MISC TEST  1-4 TIMES PER DAY FOR GLUCOSE CHECK 300 each 3  . traMADol (ULTRAM) 50 MG tablet Take 1 tablet (50 mg total) by mouth every 8 (eight) hours as needed. 20 tablet 3   No current facility-administered medications on file prior to visit.     Allergies  Allergen Reactions  . Morphine     REACTION: difficulty breathing    Family History  Problem Relation Age of Onset  . Asthma Mother   . Arthritis Mother   . COPD Father   . Heart disease Sister        eisenberger syndrome  . Lupus Sister   . Diabetes Father      Review of Systems He denies hypoglycemia    Objective:   Physical Exam       Assessment & Plan:  Type 1 DM: uncertain glycemic control.  we discussed.  he declines to change insulin now.  Occupational status: this renders rx of DM very difficult.    Patient Instructions  check your blood sugar twice a day.  vary the time of day when you check, between before the 3 meals, and at bedtime.  also check if you have symptoms of your blood sugar being too high or too low.  please keep a record of the readings and bring it to your next appointment here.  You can write it on any piece of paper.  please call us sooner if your blood sugar goes below 70.   Please continue the same insulins.  On this type of insulin schedule, you should eat meals (or at least a snack), on a regular schedule.  If a meal is missed or significantly delayed, your blood sugar could go low.   Please come back for a follow-up appointment in 2 months.

## 2018-09-15 NOTE — Patient Instructions (Addendum)
check your blood sugar twice a day.  vary the time of day when you check, between before the 3 meals, and at bedtime.  also check if you have symptoms of your blood sugar being too high or too low.  please keep a record of the readings and bring it to your next appointment here.  You can write it on any piece of paper.  please call us sooner if your blood sugar goes below 70.   Please continue the same insulins.  On this type of insulin schedule, you should eat meals (or at least a snack), on a regular schedule.  If a meal is missed or significantly delayed, your blood sugar could go low.   Please come back for a follow-up appointment in 2 months.

## 2018-10-05 ENCOUNTER — Telehealth: Payer: Self-pay

## 2018-10-05 NOTE — Telephone Encounter (Signed)
LOV 09/15/18. Per Dr. Everardo All, follow-up appointment in 2 months. LVM requesting returned call.

## 2018-11-03 ENCOUNTER — Other Ambulatory Visit: Payer: Self-pay | Admitting: Endocrinology

## 2018-11-28 ENCOUNTER — Telehealth: Payer: Self-pay | Admitting: Endocrinology

## 2018-11-28 NOTE — Telephone Encounter (Signed)
Patient returned call.  He said he would be able to get a call back after 1145 AM

## 2018-11-28 NOTE — Telephone Encounter (Signed)
Patients wife has called stating that the Novolog he has started taking is making him very drowsy. Also, informed insurance will cover the Novolin 70/30.  Please Advise, Thanks

## 2018-11-28 NOTE — Telephone Encounter (Signed)
We would need to address this part at [facility]

## 2018-11-28 NOTE — Telephone Encounter (Signed)
Called pt and informed to keep insulin dosage as ordered. If drowsiness persists, may move August appt to a sooner appt. However, pt declined stating, "I'm in [place], [place]". States he will monitor and address in August.

## 2018-11-28 NOTE — Telephone Encounter (Signed)
And his concern about "drowsiness". Your advice?

## 2018-11-28 NOTE — Telephone Encounter (Signed)
Please continue the same insulins for now. I'll see you next month

## 2018-11-28 NOTE — Telephone Encounter (Signed)
Attempted to call pt to follow up on his concern and request as directed by insurance. Was not able to hear pt d/t increased feedback received from his phone. Asked that he call me once he has returned home so we can discuss further. Verbalized acceptance and understanding.

## 2018-11-28 NOTE — Telephone Encounter (Signed)
1. Are you taking any type of steroids? No  2. Are you sick or becoming sick? No  3. Is this the first elevated blood sugar reading? N/A  4. Have you tried to correct it with insulin? N/A  5. Have you been taking/taken today all of the prescribed medications? Yes  6. What is your current diabetic insulin or oral medication dosage? Novolog Inject 14 Units into the skin daily with supper.   Date Time Reading Notes       7/20 545a 181   7/20 1207p 172   7/19 448p 201   7/19 7pm 132   7/19 820p 133   7/18 601p 300   7/18 910p 98   7/17 1146p 239     Please advise

## 2018-12-22 ENCOUNTER — Other Ambulatory Visit: Payer: Self-pay

## 2018-12-26 ENCOUNTER — Other Ambulatory Visit: Payer: Self-pay

## 2018-12-26 ENCOUNTER — Encounter: Payer: Self-pay | Admitting: Endocrinology

## 2018-12-26 ENCOUNTER — Ambulatory Visit: Payer: 59 | Admitting: Endocrinology

## 2018-12-26 VITALS — BP 110/72 | HR 84 | Ht 76.0 in | Wt 211.6 lb

## 2018-12-26 DIAGNOSIS — E10649 Type 1 diabetes mellitus with hypoglycemia without coma: Secondary | ICD-10-CM

## 2018-12-26 DIAGNOSIS — E119 Type 2 diabetes mellitus without complications: Secondary | ICD-10-CM

## 2018-12-26 DIAGNOSIS — E109 Type 1 diabetes mellitus without complications: Secondary | ICD-10-CM | POA: Diagnosis not present

## 2018-12-26 LAB — POCT GLYCOSYLATED HEMOGLOBIN (HGB A1C): Hemoglobin A1C: 8.5 % — AB (ref 4.0–5.6)

## 2018-12-26 MED ORDER — INSULIN ASPART 100 UNIT/ML ~~LOC~~ SOLN: 12.0000 [IU] | Freq: Every day | SUBCUTANEOUS | 11 refills | Status: DC

## 2018-12-26 MED ORDER — BASAGLAR KWIKPEN 100 UNIT/ML ~~LOC~~ SOPN: 55.0000 [IU] | PEN_INJECTOR | SUBCUTANEOUS | 2 refills | Status: DC

## 2018-12-26 MED ORDER — FREESTYLE LIBRE 14 DAY SENSOR MISC: 1.0000 | 3 refills | Status: DC

## 2018-12-26 NOTE — Progress Notes (Signed)
error 

## 2018-12-26 NOTE — Progress Notes (Signed)
Subjective:    Patient ID: Alex Watson, male    DOB: 08-22-1965, 53 y.o.   MRN: [id number]  HPI Pt returns for f/u of diabetes mellitus: DM type: 1  Dx'ed: 9924 Complications: none.   Therapy: insulin since mid-2017.   DKA: never. Severe hypoglycemia: never.   Pancreatitis: never.  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   Interval history: Pt says he has mild hypoglycemia approx once per week.  This usually happens in the late afternoon.  I reviewed continuous glucose monitor data.  Glucose varies from 60-400.  It is highest before the evening meal.   Past Medical History:  Diagnosis Date  . Allergy   . Diabetes mellitus without complication ([place])   . Uncontrolled daytime somnolence 03/07/2013    Past Surgical History:  Procedure Laterality Date  . MANDIBLE SURGERY  1987    Social History   Socioeconomic History  . Marital status: Married    Spouse name: Not on file  . Number of children: Not on file  . Years of education: Not on file  . Highest education level: Not on file  Occupational History  . Not on file  Social Needs  . Financial resource strain: Not on file  . Food insecurity    Worry: Not on file    Inability: Not on file  . Transportation needs    Medical: Not on file    Non-medical: Not on file  Tobacco Use  . Smoking status: Current Every Day Smoker    Years: 24.00    Types: Cigarettes  . Smokeless tobacco: Never Used  Substance and Sexual Activity  . Alcohol use: Yes    Comment: BEER  . Drug use: No  . Sexual activity: Yes    Comment: number of sex partners in the last 12 months 1  Lifestyle  . Physical activity    Days per week: Not on file    Minutes per session: Not on file  . Stress: Not on file  Relationships  . Social Herbalist on phone: Not on file    Gets together:  Not on file    Attends religious service: Not on file    Active member of club or organization: Not on file    Attends meetings of clubs or organizations: Not on file    Relationship status: Not on file  . Intimate partner violence    Fear of current or ex partner: Not on file    Emotionally abused: Not on file    Physically abused: Not on file    Forced sexual activity: Not on file  Other Topics Concern  . Not on file  Social History Narrative  . Not on file    Current Outpatient Medications on File Prior to Visit  Medication Sig Dispense Refill  . blood glucose meter kit and supplies KIT Dispense based on patient and insurance preference. Use up to four times daily as directed. (FOR ICD-9 250.00, 250.01). 1 each 0  . Continuous Blood Gluc Receiver (FREESTYLE LIBRE 14 DAY READER) DEVI USE AS DIRECTED 1 Device 0  . glucose blood (ONE TOUCH ULTRA TEST) test strip TEST four times a day 100 each 3  . Insulin Syringe-Needle U-100 (B-D INS SYR ULTRAFINE 1CC/30G) 30G X 1/2" 1 ML MISC USE AS DIRECTED TWICE  DAILY 200 each 2  . ONETOUCH DELICA LANCETS FINE MISC TEST 1-4 TIMES PER DAY FOR GLUCOSE CHECK 300 each 3   No current facility-administered medications on file prior to visit.     Allergies  Allergen Reactions  . Morphine     REACTION: difficulty breathing    Family History  Problem Relation Age of Onset  . Asthma Mother   . Arthritis Mother   . COPD Father   . Heart disease Sister        eisenberger syndrome  . Lupus Sister   . Diabetes Father     BP 110/72 (BP Location: Left Arm, Patient Position: Sitting, Cuff Size: Normal)   Pulse 84   Ht 6' 4"  (1.93 m)   Wt 211 lb 9.6 oz (96 kg)   SpO2 95%   BMI 25.76 kg/m    Review of Systems Denies LOC.  He has lost a few lbs recently.      Objective:   Physical Exam VITAL SIGNS:  See vs page GENERAL: no distress Pulses: dorsalis pedis intact bilat.   MSK: no deformity of the feet CV: no leg edema Skin:  no ulcer on  the feet.  normal color and temp on the feet. Neuro: sensation is intact to touch on the feet    Lab Results  Component Value Date   HGBA1C 8.5 (A) 12/26/2018      Assessment & Plan:  Type 1 DM: The pattern of his cbg's indicates he needs some adjustment in his therapy Hypoglycemia: this limits aggressiveness of glycemic control Occupational status: he eats most of his daily calories at evening meal.   Patient Instructions  check your blood sugar twice a day.  vary the time of day when you check, between before the 3 meals, and at bedtime.  also check if you have symptoms of your blood sugar being too high or too low.  please keep a record of the readings and bring it to your next appointment here.  You can write it on any piece of paper.  please call us sooner if your blood sugar goes below 70.   Please change the insulins to the numbers listed below.  On this type of insulin schedule, you should eat meals (or at least a snack), on a regular schedule.  If a meal is missed or significantly delayed, your blood sugar could go low.   Please come back for a follow-up appointment in 2 months.

## 2018-12-26 NOTE — Patient Instructions (Addendum)
check your blood sugar twice a day.  vary the time of day when you check, between before the 3 meals, and at bedtime.  also check if you have symptoms of your blood sugar being too high or too low.  please keep a record of the readings and bring it to your next appointment here.  You can write it on any piece of paper.  please call us sooner if your blood sugar goes below 70.   Please change the insulins to the numbers listed below.  On this type of insulin schedule, you should eat meals (or at least a snack), on a regular schedule.  If a meal is missed or significantly delayed, your blood sugar could go low.   Please come back for a follow-up appointment in 2 months.

## 2018-12-27 ENCOUNTER — Other Ambulatory Visit: Payer: Self-pay

## 2018-12-27 DIAGNOSIS — E119 Type 2 diabetes mellitus without complications: Secondary | ICD-10-CM

## 2018-12-27 MED ORDER — INSULIN LISPRO 100 UNIT/ML ~~LOC~~ SOLN: 12.0000 [IU] | Freq: Every day | SUBCUTANEOUS | 0 refills | Status: DC

## 2018-12-28 ENCOUNTER — Other Ambulatory Visit: Payer: Self-pay

## 2018-12-28 DIAGNOSIS — E119 Type 2 diabetes mellitus without complications: Secondary | ICD-10-CM

## 2018-12-28 MED ORDER — INSULIN ASPART 100 UNIT/ML ~~LOC~~ SOLN: 12.0000 [IU] | Freq: Every day | SUBCUTANEOUS | 0 refills | Status: DC

## 2018-12-30 ENCOUNTER — Telehealth: Payer: Self-pay | Admitting: Endocrinology

## 2018-12-30 NOTE — Telephone Encounter (Signed)
insulin aspart (NOVOLOG) 100 UNIT/ML injection 3 mL 0 12/28/2018    Sig - Route: Inject 12 Units into the skin daily with supper. - Subcutaneous   Sent to pharmacy as: insulin aspart (NOVOLOG) 100 UNIT/ML injection   E-Prescribing Status: Receipt confirmed by pharmacy (12/28/2018 3:11 PM EDT)    This was already sent 12/28/18

## 2018-12-30 NOTE — Telephone Encounter (Signed)
Patient's wife Pam called re: Patient's insurance does not cover Humalog but does cover Novolog.  Please send RX for Novolog to:   [id number] [address], [place] [phone] (Phone) [phone] (Fax)

## 2019-02-27 ENCOUNTER — Ambulatory Visit: Payer: 59 | Admitting: Endocrinology

## 2019-03-09 ENCOUNTER — Other Ambulatory Visit: Payer: Self-pay

## 2019-03-13 ENCOUNTER — Ambulatory Visit: Payer: 59 | Admitting: Endocrinology

## 2019-03-13 ENCOUNTER — Encounter: Payer: Self-pay | Admitting: Endocrinology

## 2019-03-13 VITALS — BP 118/64 | HR 76 | Ht 76.0 in | Wt 207.4 lb

## 2019-03-13 DIAGNOSIS — L97329 Non-pressure chronic ulcer of left ankle with unspecified severity: Secondary | ICD-10-CM | POA: Diagnosis not present

## 2019-03-13 DIAGNOSIS — E109 Type 1 diabetes mellitus without complications: Secondary | ICD-10-CM

## 2019-03-13 DIAGNOSIS — Z23 Encounter for immunization: Secondary | ICD-10-CM | POA: Diagnosis not present

## 2019-03-13 DIAGNOSIS — E119 Type 2 diabetes mellitus without complications: Secondary | ICD-10-CM

## 2019-03-13 LAB — POCT GLYCOSYLATED HEMOGLOBIN (HGB A1C): Hemoglobin A1C: 8.4 % — AB (ref 4.0–5.6)

## 2019-03-13 MED ORDER — BASAGLAR KWIKPEN 100 UNIT/ML ~~LOC~~ SOPN: 55.0000 [IU] | PEN_INJECTOR | Freq: Every day | SUBCUTANEOUS | 3 refills | Status: DC

## 2019-03-13 MED ORDER — INSULIN ASPART 100 UNIT/ML ~~LOC~~ SOLN: 10.0000 [IU] | Freq: Every day | SUBCUTANEOUS | 3 refills | Status: DC

## 2019-03-13 MED ORDER — INSULIN ASPART 100 UNIT/ML ~~LOC~~ SOLN: 10.0000 [IU] | Freq: Every day | SUBCUTANEOUS | 0 refills | Status: DC

## 2019-03-13 MED ORDER — BASAGLAR KWIKPEN 100 UNIT/ML ~~LOC~~ SOPN: 55.0000 [IU] | PEN_INJECTOR | Freq: Every day | SUBCUTANEOUS | 2 refills | Status: DC

## 2019-03-13 NOTE — Progress Notes (Signed)
Subjective:    Patient ID: Alex Watson, male    DOB: 1966-01-30, 53 y.o.   MRN: [id number]  HPI Pt returns for f/u of diabetes mellitus: DM type: 1  Dx'ed: 8768 Complications: none.   Therapy: insulin since mid-2017.   DKA: never. Severe hypoglycemia: never.   Pancreatitis: never.  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  He awakens at 3:30 AM.   Interval history: Pt says he has mild hypoglycemia approx QOD.  This usually happens in the late afternoon.  I reviewed continuous glucose monitor data.  Glucose varies from 50-400.  It increases overnight, then decreases 8 AM-5 PM Past Medical History:  Diagnosis Date  . Allergy   . Diabetes mellitus without complication ([place])   . Uncontrolled daytime somnolence 03/07/2013    Past Surgical History:  Procedure Laterality Date  . MANDIBLE SURGERY  1987    Social History   Socioeconomic History  . Marital status: Married    Spouse name: Not on file  . Number of children: Not on file  . Years of education: Not on file  . Highest education level: Not on file  Occupational History  . Not on file  Social Needs  . Financial resource strain: Not on file  . Food insecurity    Worry: Not on file    Inability: Not on file  . Transportation needs    Medical: Not on file    Non-medical: Not on file  Tobacco Use  . Smoking status: Current Every Day Smoker    Years: 24.00    Types: Cigarettes  . Smokeless tobacco: Never Used  Substance and Sexual Activity  . Alcohol use: Yes    Comment: BEER  . Drug use: No  . Sexual activity: Yes    Comment: number of sex partners in the last 12 months 1  Lifestyle  . Physical activity    Days per week: Not on file    Minutes per session: Not on file  . Stress: Not on file  Relationships  . Social Herbalist on phone: Not on file     Gets together: Not on file    Attends religious service: Not on file    Active member of club or organization: Not on file    Attends meetings of clubs or organizations: Not on file    Relationship status: Not on file  . Intimate partner violence    Fear of current or ex partner: Not on file    Emotionally abused: Not on file    Physically abused: Not on file    Forced sexual activity: Not on file  Other Topics Concern  . Not on file  Social History Narrative  . Not on file    Current Outpatient Medications on File Prior to Visit  Medication Sig Dispense Refill  . blood glucose meter kit and supplies KIT Dispense based on patient and insurance preference. Use up to four times daily as directed. (FOR ICD-9 250.00, 250.01). 1 each 0  . Continuous Blood Gluc Receiver (FREESTYLE LIBRE 14 DAY READER) DEVI USE AS DIRECTED 1 Device 0  . Continuous Blood Gluc Sensor (FREESTYLE LIBRE 14 DAY SENSOR) MISC 1 Device by Other route every 14 (fourteen) days. 6 each 3  . glucose blood (ONE TOUCH ULTRA TEST) test strip TEST  four times a day 100 each 3  . Insulin Syringe-Needle U-100 (B-D INS SYR ULTRAFINE 1CC/30G) 30G X 1/2" 1 ML MISC USE AS DIRECTED TWICE DAILY 200 each 2  . ONETOUCH DELICA LANCETS FINE MISC TEST 1-4 TIMES PER DAY FOR GLUCOSE CHECK 300 each 3   No current facility-administered medications on file prior to visit.     Allergies  Allergen Reactions  . Morphine     REACTION: difficulty breathing    Family History  Problem Relation Age of Onset  . Asthma Mother   . Arthritis Mother   . COPD Father   . Heart disease Sister        eisenberger syndrome  . Lupus Sister   . Diabetes Father     BP 118/64 (BP Location: Left Arm, Patient Position: Sitting, Cuff Size: Normal)   Pulse 76   Ht 6' 4"  (1.93 m)   Wt 207 lb 6.4 oz (94.1 kg)   SpO2 96%   BMI 25.25 kg/m    Review of Systems Denies LOC    Objective:   Physical Exam VITAL SIGNS:  See vs page GENERAL: no  distress Pulses: dorsalis pedis intact bilat.   MSK: no deformity of the feet CV: no leg edema Skin:  Shallow ulcer at the left lat malleolus.  No erythema/drainage.  normal color and temp on the feet. Neuro: sensation is intact to touch on the feet   Lab Results  Component Value Date   HGBA1C 8.4 (A) 03/13/2019       Assessment & Plan:  Ankle ulcer, new, non-pressure Type 1 DM: he needs increased rx Hypoglycemia: this limits aggressiveness of glycemic control   Patient Instructions  Keep the ulcer covered with antibiotic ointment and large bandaids, until it heals.  Please call next week if it is not much better by then.   Please change the basaglar to all 55 units in the evening, and: Reduce the Novolog to 10 units with supper.  Our goal is to reduce the low blood sugar first.  Only then can we attack the highs.   Please come back for a follow-up appointment in 3 months.

## 2019-03-13 NOTE — Patient Instructions (Addendum)
Keep the ulcer covered with antibiotic ointment and large bandaids, until it heals.  Please call next week if it is not much better by then.   Please change the basaglar to all 55 units in the evening, and: Reduce the Novolog to 10 units with supper.  Our goal is to reduce the low blood sugar first.  Only then can we attack the highs.   Please come back for a follow-up appointment in 3 months.

## 2019-05-02 ENCOUNTER — Ambulatory Visit: Payer: 59 | Admitting: Family Medicine

## 2019-05-02 ENCOUNTER — Other Ambulatory Visit: Payer: Self-pay

## 2019-05-03 ENCOUNTER — Ambulatory Visit (INDEPENDENT_AMBULATORY_CARE_PROVIDER_SITE_OTHER): Payer: Managed Care, Other (non HMO) | Admitting: Family Medicine

## 2019-05-03 ENCOUNTER — Encounter: Payer: Self-pay | Admitting: Family Medicine

## 2019-05-03 VITALS — BP 92/50 | HR 73 | Temp 97.1°F | Ht 76.0 in | Wt 212.0 lb

## 2019-05-03 DIAGNOSIS — Z23 Encounter for immunization: Secondary | ICD-10-CM

## 2019-05-03 DIAGNOSIS — Z794 Long term (current) use of insulin: Secondary | ICD-10-CM

## 2019-05-03 DIAGNOSIS — Z0000 Encounter for general adult medical examination without abnormal findings: Secondary | ICD-10-CM | POA: Diagnosis not present

## 2019-05-03 DIAGNOSIS — Z13220 Encounter for screening for lipoid disorders: Secondary | ICD-10-CM

## 2019-05-03 DIAGNOSIS — E119 Type 2 diabetes mellitus without complications: Secondary | ICD-10-CM

## 2019-05-03 DIAGNOSIS — Z125 Encounter for screening for malignant neoplasm of prostate: Secondary | ICD-10-CM

## 2019-05-03 LAB — COMPREHENSIVE METABOLIC PANEL
ALT: 12 U/L (ref 0–53)
AST: 13 U/L (ref 0–37)
Albumin: 4.2 g/dL (ref 3.5–5.2)
Alkaline Phosphatase: 78 U/L (ref 39–117)
BUN: 16 mg/dL (ref 6–23)
CO2: 26 mEq/L (ref 19–32)
Calcium: 9.2 mg/dL (ref 8.4–10.5)
Chloride: 101 mEq/L (ref 96–112)
Creatinine, Ser: 0.9 mg/dL (ref 0.40–1.50)
GFR: 88.75 mL/min (ref >60.00)
Glucose, Bld: 154 mg/dL — ABNORMAL HIGH (ref 70–99)
Potassium: 4.4 mEq/L (ref 3.5–5.1)
Sodium: 136 mEq/L (ref 135–145)
Total Bilirubin: 0.7 mg/dL (ref 0.2–1.2)
Total Protein: 6.4 g/dL (ref 6.0–8.3)

## 2019-05-03 LAB — CBC
HCT: 48.7 % (ref 39.0–52.0)
Hemoglobin: 16.4 g/dL (ref 13.0–17.0)
MCHC: 33.7 g/dL (ref 30.0–36.0)
MCV: 98.2 fl (ref 78.0–100.0)
Platelets: 317 10*3/uL (ref 150.0–400.0)
RBC: 4.96 Mil/uL (ref 4.22–5.81)
RDW: 14 % (ref 11.5–15.5)
WBC: 8.3 10*3/uL (ref 4.0–10.5)

## 2019-05-03 LAB — LIPID PANEL
Cholesterol: 194 mg/dL (ref 0–200)
HDL: 40.3 mg/dL (ref >39.00)
LDL Cholesterol: 132 mg/dL — ABNORMAL HIGH (ref 0–99)
NonHDL: 153.47
Total CHOL/HDL Ratio: 5
Triglycerides: 107 mg/dL (ref 0.0–149.0)
VLDL: 21.4 mg/dL (ref 0.0–40.0)

## 2019-05-03 LAB — PSA: PSA: 0.72 ng/mL (ref 0.10–4.00)

## 2019-05-03 LAB — TSH: TSH: 0.89 u[IU]/mL (ref 0.35–4.50)

## 2019-05-03 NOTE — Progress Notes (Addendum)
Alex Watson - 53 y.o. male MRN [id number]  Date of birth: 1965-10-28  Subjective Chief Complaint  Patient presents with  . New Patient (Initial Visit)  . Annual Exam    HPI Alex Watson is a 53 y.o. male with history of Type 1.5 diabetes managed as type 1 and nicotine dependence here today for initial visit and annual exam.  He is followed by Dr. Loanne Drilling for management of his diabetes.  He reports he is doing well at this time and denies any concerns.  Last a1c was 8.4%.  He does admit to having some occasional low blood sugars.  He is a smoker, no interest in quitting at this time.  He consumes EtOH in moderation.  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  This job keeps him somewhat active but he does admit he could be more active.  He has had flu vaccine.  He has not had a pneumonia vaccine before.  Colon cancer screening completed in 2011 due to family history of colon cancer, normal at that time. Due for repeat next year.   Review of Systems  Constitutional: Negative for chills, fever, malaise/fatigue and weight loss.  HENT: Negative for congestion, ear pain and sore throat.   Eyes: Negative for blurred vision, double vision and pain.  Respiratory: Negative for cough and shortness of breath.   Cardiovascular: Negative for chest pain and palpitations.  Gastrointestinal: Negative for abdominal pain, blood in stool, constipation, heartburn and nausea.  Genitourinary: Negative for dysuria and urgency.  Musculoskeletal: Negative for joint pain and myalgias.  Neurological: Negative for dizziness and headaches.  Endo/Heme/Allergies: Does not bruise/bleed easily.  Psychiatric/Behavioral: Negative for depression. The patient is not nervous/anxious and does not have insomnia.     Allergies  Allergen Reactions  . Morphine     REACTION: difficulty breathing     Past Medical History:  Diagnosis Date  . Allergy   . Diabetes mellitus without complication ([place])   . Uncontrolled daytime somnolence 03/07/2013    Past Surgical History:  Procedure Laterality Date  . MANDIBLE SURGERY  1987    Social History   Socioeconomic History  . Marital status: Married    Spouse name: Not on file  . Number of children: Not on file  . Years of education: Not on file  . Highest education level: Not on file  Occupational History  . Not on file  Tobacco Use  . Smoking status: Current Every Day Smoker    Years: 24.00    Types: Cigarettes  . Smokeless tobacco: Never Used  Substance and Sexual Activity  . Alcohol use: Yes    Comment: BEER  . Drug use: No  . Sexual activity: Yes    Comment: number of sex partners in the last 12 months 1  Other Topics Concern  . Not on file  Social History Narrative  . Not on file   Social Determinants of Health   Financial Resource Strain:   . Difficulty of Paying Living Expenses: Not on file  Food Insecurity:   . Worried About Charity fundraiser in the Last Year: Not on file  . Ran Out of Food in the Last Year: Not on file  Transportation Needs:   . Lack of Transportation (Medical): Not on file  . Lack of Transportation (Non-Medical): Not on file  Physical Activity:   . Days  of Exercise per Week: Not on file  . Minutes of Exercise per Session: Not on file  Stress:   . Feeling of Stress : Not on file  Social Connections:   . Frequency of Communication with Friends and Family: Not on file  . Frequency of Social Gatherings with Friends and Family: Not on file  . Attends Religious Services: Not on file  . Active Member of Clubs or Organizations: Not on file  . Attends Archivist Meetings: Not on file  . Marital Status: Not on file    Family History  Problem Relation Age of Onset  . Asthma Mother   . Arthritis Mother   . COPD Father   . Diabetes Father   . Heart disease Sister         eisenberger syndrome  . Lupus Sister     Health Maintenance  Topic Date Due  . PNEUMOCOCCAL POLYSACCHARIDE VACCINE AGE 81-64 HIGH RISK  09/22/1969  . HIV Screening  09/23/1982  . TETANUS/TDAP  09/23/1986  . URINE MICROALBUMIN  12/09/2017  . OPHTHALMOLOGY EXAM  07/05/2018  . COLONOSCOPY  05/14/2019  . FOOT EXAM  06/21/2019  . HEMOGLOBIN A1C  09/10/2019  . INFLUENZA VACCINE  Completed    ----------------------------------------------------------------------------------------------------------------------------------------------------------------------------------------------------------------- Physical Exam There were no vitals taken for this visit.  Physical Exam Constitutional:      General: He is not in acute distress.    Appearance: Normal appearance.  HENT:     Head: Normocephalic and atraumatic.     Right Ear: Tympanic membrane and external ear normal.     Left Ear: Tympanic membrane and external ear normal.     Mouth/Throat:     Mouth: Mucous membranes are moist.  Eyes:     General: No scleral icterus. Neck:     Thyroid: No thyromegaly.  Cardiovascular:     Rate and Rhythm: Normal rate and regular rhythm.     Heart sounds: Normal heart sounds.  Pulmonary:     Effort: Pulmonary effort is normal.     Breath sounds: Normal breath sounds.  Abdominal:     General: Bowel sounds are normal. There is no distension.     Palpations: Abdomen is soft.     Tenderness: There is no abdominal tenderness. There is no guarding.  Musculoskeletal:     Cervical back: Normal range of motion.  Lymphadenopathy:     Cervical: No cervical adenopathy.  Skin:    General: Skin is warm and dry.     Findings: No rash.  Neurological:     General: No focal deficit present.     Mental Status: He is alert and oriented to person, place, and time.     Cranial Nerves: No cranial nerve deficit.     Motor: No abnormal muscle tone.  Psychiatric:        Mood and Affect: Mood normal.         Behavior: Behavior normal.     ------------------------------------------------------------------------------------------------------------------------------------------------------------------------------------------------------------------- Assessment and Plan  Type 2 diabetes mellitus without complication He will continue to follow with endo for management of this.   Well adult exam Well adult Orders Placed This Encounter  Procedures  . Pneumococcal polysaccharide vaccine 23-valent greater than or equal to 2yo subcutaneous/IM  . Comp Met (CMET)  . CBC  . Lipid panel  . PSA  . TSH  Screening: Lipid, PSA Immunizations: Pneumovax Anticipatory guidance/Risk factor reduction:  Counseled on smoking cessation and healthy lifestyle habits.  Recommend low carb diet.  Additional  recommendations per AVS.    This visit occurred during the SARS-CoV-2 public health emergency.  Safety protocols were in place, including screening questions prior to the visit, additional usage of staff PPE, and extensive cleaning of exam room while observing appropriate contact time as indicated for disinfecting solutions.

## 2019-05-03 NOTE — Assessment & Plan Note (Signed)
Well adult Orders Placed This Encounter  Procedures  . Pneumococcal polysaccharide vaccine 23-valent greater than or equal to 51yo subcutaneous/IM  . Comp Met (CMET)  . CBC  . Lipid panel  . PSA  . TSH  Screening: Lipid, PSA Immunizations: Pneumovax Anticipatory guidance/Risk factor reduction:  Counseled on smoking cessation and healthy lifestyle habits.  Recommend low carb diet.  Additional recommendations per AVS.

## 2019-05-03 NOTE — Patient Instructions (Signed)
Preventive Care 41-51 Years Old, Male Preventive care refers to lifestyle choices and visits with your health care provider that can promote health and wellness. This includes:  A yearly physical exam. This is also called an annual well check.  Regular dental and eye exams.  Immunizations.  Screening for certain conditions.  Healthy lifestyle choices, such as eating a healthy diet, getting regular exercise, not using drugs or products that contain nicotine and tobacco, and limiting alcohol use. What can I expect for my preventive care visit? Physical exam Your health care provider will check:  Height and weight. These may be used to calculate body mass index (BMI), which is a measurement that tells if you are at a healthy weight.  Heart rate and blood pressure.  Your skin for abnormal spots. Counseling Your health care provider may ask you questions about:  Alcohol, tobacco, and drug use.  Emotional well-being.  Home and relationship well-being.  Sexual activity.  Eating habits.  Work and work Statistician. What immunizations do I need?  Influenza (flu) vaccine  This is recommended every year. Tetanus, diphtheria, and pertussis (Tdap) vaccine  You may need a Td booster every 10 years. Varicella (chickenpox) vaccine  You may need this vaccine if you have not already been vaccinated. Zoster (shingles) vaccine  You may need this after age 64. Measles, mumps, and rubella (MMR) vaccine  You may need at least one dose of MMR if you were born in 1957 or later. You may also need a second dose. Pneumococcal conjugate (PCV13) vaccine  You may need this if you have certain conditions and were not previously vaccinated. Pneumococcal polysaccharide (PPSV23) vaccine  You may need one or two doses if you smoke cigarettes or if you have certain conditions. Meningococcal conjugate (MenACWY) vaccine  You may need this if you have certain conditions. Hepatitis A  vaccine  You may need this if you have certain conditions or if you travel or work in places where you may be exposed to hepatitis A. Hepatitis B vaccine  You may need this if you have certain conditions or if you travel or work in places where you may be exposed to hepatitis B. Haemophilus influenzae type b (Hib) vaccine  You may need this if you have certain risk factors. Human papillomavirus (HPV) vaccine  If recommended by your health care provider, you may need three doses over 6 months. You may receive vaccines as individual doses or as more than one vaccine together in one shot (combination vaccines). Talk with your health care provider about the risks and benefits of combination vaccines. What tests do I need? Blood tests  Lipid and cholesterol levels. These may be checked every 5 years, or more frequently if you are over 60 years old.  Hepatitis C test.  Hepatitis B test. Screening  Lung cancer screening. You may have this screening every year starting at age 43 if you have a 30-pack-year history of smoking and currently smoke or have quit within the past 15 years.  Prostate cancer screening. Recommendations will vary depending on your family history and other risks.  Colorectal cancer screening. All adults should have this screening starting at age 72 and continuing until age 2. Your health care provider may recommend screening at age 14 if you are at increased risk. You will have tests every 1-10 years, depending on your results and the type of screening test.  Diabetes screening. This is done by checking your blood sugar (glucose) after you have not eaten  for a while (fasting). You may have this done every 1-3 years.  Sexually transmitted disease (STD) testing. Follow these instructions at home: Eating and drinking  Eat a diet that includes fresh fruits and vegetables, whole grains, lean protein, and low-fat dairy products.  Take vitamin and mineral supplements as recommended  by your health care provider.  Do not drink alcohol if your health care provider tells you not to drink.  If you drink alcohol: ? Limit how much you have to 0-2 drinks a day. ? Be aware of how much alcohol is in your drink. In the [place] one drink equals one 12 oz bottle of beer (355 mL), one 5 oz glass of wine (148 mL), or one 1 oz glass of hard liquor (44 mL). Lifestyle  Take daily care of your teeth and gums.  Stay active. Exercise for at least 30 minutes on 5 or more days each week.  Do not use any products that contain nicotine or tobacco, such as cigarettes, e-cigarettes, and chewing tobacco. If you need help quitting, ask your health care provider.  If you are sexually active, practice safe sex. Use a condom or other form of protection to prevent STIs (sexually transmitted infections).  Talk with your health care provider about taking a low-dose aspirin every day starting at age 50. What's next?  Go to your health care provider once a year for a well check visit.  Ask your health care provider how often you should have your eyes and teeth checked.  Stay up to date on all vaccines. This information is not intended to replace advice given to you by your health care provider. Make sure you discuss any questions you have with your health care provider. Document Released: 05/24/2015 Document Revised: 04/21/2018 Document Reviewed: 04/21/2018 Elsevier Patient Education  2020 Elsevier Inc.   

## 2019-05-03 NOTE — Assessment & Plan Note (Signed)
He will continue to follow with endo for management of this.

## 2019-05-04 ENCOUNTER — Other Ambulatory Visit: Payer: Self-pay

## 2019-05-04 DIAGNOSIS — E7800 Pure hypercholesterolemia, unspecified: Secondary | ICD-10-CM

## 2019-05-04 MED ORDER — ATORVASTATIN CALCIUM 40 MG PO TABS: 40.0000 mg | ORAL_TABLET | Freq: Every day | ORAL | 1 refills | Status: DC

## 2019-05-04 NOTE — Progress Notes (Signed)
LDL (bad cholesterol) levels are elevated I would recommend a medication to lower this as we discussed at visit to reduce his risk of heart attack/stroke.  If ok with starting please send in atorvastatin 40mg  1 tab daily dispense #90 with 1 refill.  Recommend recheck of lipids in about 6 months.   Other labs look good.

## 2019-05-11 LAB — HM DIABETES EYE EXAM

## 2019-05-22 ENCOUNTER — Telehealth: Payer: Self-pay | Admitting: Endocrinology

## 2019-05-22 ENCOUNTER — Other Ambulatory Visit: Payer: Self-pay

## 2019-05-22 DIAGNOSIS — E119 Type 2 diabetes mellitus without complications: Secondary | ICD-10-CM

## 2019-05-22 MED ORDER — LANTUS SOLOSTAR 100 UNIT/ML ~~LOC~~ SOPN: 55.0000 [IU] | PEN_INJECTOR | Freq: Every day | SUBCUTANEOUS | 2 refills | Status: DC

## 2019-05-22 MED ORDER — INSULIN LISPRO 100 UNIT/ML ~~LOC~~ SOLN: 10.0000 [IU] | Freq: Every day | SUBCUTANEOUS | 2 refills | Status: DC

## 2019-05-22 NOTE — Telephone Encounter (Signed)
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  LAST APPOINTMENT DATE: @1 /08/2019  NEXT APPOINTMENT DATE:@2 /12/2019  DO WE HAVE YOUR PERMISSION TO LEAVE A DETAILED MESSAGE: Yes  OTHER COMMENTS:    **Let patient know to contact pharmacy at the end of the day to make sure medication is ready. **  ** Please notify patient to allow 48-72 hours to process**  **Encourage patient to contact the pharmacy for refills or they can request refills through [facility]**   for HumalogAN Lantus Solostar Pen

## 2019-05-22 NOTE — Telephone Encounter (Signed)
OK to change both.  Same dosages

## 2019-05-22 NOTE — Telephone Encounter (Signed)
Please advise if you would Rx's written for same dosage

## 2019-05-22 NOTE — Telephone Encounter (Signed)
E-Prescribing Status: Receipt confirmed by pharmacy (05/22/2019  3:31 PM EST)  Per Dr. George Hugh orders, Rx have been changed to Lantus Solostar and Humalog.

## 2019-06-19 ENCOUNTER — Ambulatory Visit: Payer: 59 | Admitting: Endocrinology

## 2019-07-11 ENCOUNTER — Ambulatory Visit (INDEPENDENT_AMBULATORY_CARE_PROVIDER_SITE_OTHER): Payer: 59 | Admitting: Endocrinology

## 2019-07-11 ENCOUNTER — Other Ambulatory Visit: Payer: Self-pay

## 2019-07-11 DIAGNOSIS — E119 Type 2 diabetes mellitus without complications: Secondary | ICD-10-CM | POA: Diagnosis not present

## 2019-07-11 MED ORDER — LANTUS SOLOSTAR 100 UNIT/ML ~~LOC~~ SOPN: 48.0000 [IU] | PEN_INJECTOR | Freq: Every day | SUBCUTANEOUS | 2 refills | Status: DC

## 2019-07-11 MED ORDER — INSULIN LISPRO 100 UNIT/ML ~~LOC~~ SOLN: 12.0000 [IU] | Freq: Every day | SUBCUTANEOUS | 2 refills | Status: DC

## 2019-07-11 NOTE — Patient Instructions (Addendum)
check your blood sugar 4 times a day: before the 3 meals, and at bedtime.  also check if you have symptoms of your blood sugar being too high or too low.  please keep a record of the readings and bring it to your next appointment here (or you can bring the meter itself).  You can write it on any piece of paper.  please call us sooner if your blood sugar goes below 70, or if you have a lot of readings over 200. Please reduce the Lantus to 48 units in the evening, and:  increase the Novolog to 12 units with supper.  Please come back for a follow-up appointment in 1-2 months.

## 2019-07-11 NOTE — Progress Notes (Signed)
Subjective:    Patient ID: Alex Watson, male    DOB: 08-20-1965, 54 y.o.   MRN: [id number]  HPI  telehealth visit today via doxy video visit.  Alternatives to telehealth are presented to this patient, and the patient agrees to the telehealth visit. Pt is advised of the cost of the visit, and agrees to this, also.   Patient is at a motel ([place], [place]), and I am at the office.   Persons attending the telehealth visit: the patient and I.  Pt returns for f/u of diabetes mellitus: DM type: 1  Dx'ed: 1245 Complications: none.   Therapy: insulin since mid-2017.   DKA: never. Severe hypoglycemia: never.   Pancreatitis: never.  SDOH: he works out of town, in [employer]. Other: he had poor results with multiple daily injections; he has freestyle libre device; he says almost all of his calories are in the evening meal.  He awakens at 3:30 AM.   Interval history: Pt says he has mild hypoglycemia approx once per month.  This usually happens in the late afternoon.  Pt says glucose varies from 45-350.  It is in general highest at HS.  He takes Lantus 50 QHS, and humalog 10 with supper.   Past Medical History:  Diagnosis Date  . Allergy   . Diabetes mellitus without complication ([place])   . Uncontrolled daytime somnolence 03/07/2013    Past Surgical History:  Procedure Laterality Date  . MANDIBLE SURGERY  1987    Social History   Socioeconomic History  . Marital status: Married    Spouse name: Not on file  . Number of children: Not on file  . Years of education: Not on file  . Highest education level: Not on file  Occupational History  . Not on file  Tobacco Use  . Smoking status: Current Every Day Smoker    Years: 24.00    Types: Cigarettes  . Smokeless tobacco: Never Used  Substance and Sexual Activity  . Alcohol use: Yes    Comment: BEER  . Drug use: No  . Sexual activity: Yes    Comment: number of sex partners in the last 12 months 1  Other Topics Concern  . Not on  file  Social History Narrative  . Not on file   Social Determinants of Health   Financial Resource Strain:   . Difficulty of Paying Living Expenses: Not on file  Food Insecurity:   . Worried About Charity fundraiser in the Last Year: Not on file  . Ran Out of Food in the Last Year: Not on file  Transportation Needs:   . Lack of Transportation (Medical): Not on file  . Lack of Transportation (Non-Medical): Not on file  Physical Activity:   . Days of Exercise per Week: Not on file  . Minutes of Exercise per Session: Not on file  Stress:   . Feeling of Stress : Not on file  Social Connections:   . Frequency of Communication with Friends and Family: Not on file  . Frequency of Social Gatherings with Friends and Family: Not on file  . Attends Religious Services: Not on file  . Active Member of Clubs or Organizations: Not on file  . Attends Archivist Meetings: Not on file  . Marital Status: Not on file  Intimate Partner Violence:   . Fear of Current or Ex-Partner: Not on file  . Emotionally Abused: Not on file  . Physically Abused: Not on file  .  Sexually Abused: Not on file    Current Outpatient Medications on File Prior to Visit  Medication Sig Dispense Refill  . atorvastatin (LIPITOR) 40 MG tablet Take 1 tablet (40 mg total) by mouth daily. 90 tablet 1  . blood glucose meter kit and supplies KIT Dispense based on patient and insurance preference. Use up to four times daily as directed. (FOR ICD-9 250.00, 250.01). 1 each 0  . Continuous Blood Gluc Receiver (FREESTYLE LIBRE 14 DAY READER) DEVI USE AS DIRECTED 1 Device 0  . Continuous Blood Gluc Sensor (FREESTYLE LIBRE 14 DAY SENSOR) MISC 1 Device by Other route every 14 (fourteen) days. 6 each 3  . glucose blood (ONE TOUCH ULTRA TEST) test strip TEST four times a day 100 each 3  . Insulin Syringe-Needle U-100 (B-D INS SYR ULTRAFINE 1CC/30G) 30G X 1/2" 1 ML MISC USE AS DIRECTED TWICE DAILY 200 each 2  . ONETOUCH DELICA  LANCETS FINE MISC TEST 1-4 TIMES PER DAY FOR GLUCOSE CHECK 300 each 3   No current facility-administered medications on file prior to visit.    Allergies  Allergen Reactions  . Morphine     REACTION: difficulty breathing    Family History  Problem Relation Age of Onset  . Asthma Mother   . Arthritis Mother   . COPD Father   . Diabetes Father   . Heart disease Sister        eisenberger syndrome  . Lupus Sister     There were no vitals taken for this visit.   Review of Systems Denies LOC.     Objective:   Physical Exam      Assessment & Plan:  Type 1 DM: uncertain glycemic control: he declines A1c now.   Hypoglycemia: this limits aggressiveness of glycemic control. occupational status: due to his job, he says he eats most of his daily calories at the evening meal, so we'l increase humalog.     Patient Instructions  check your blood sugar 4 times a day: before the 3 meals, and at bedtime.  also check if you have symptoms of your blood sugar being too high or too low.  please keep a record of the readings and bring it to your next appointment here (or you can bring the meter itself).  You can write it on any piece of paper.  please call us sooner if your blood sugar goes below 70, or if you have a lot of readings over 200. Please reduce the Lantus to 48 units in the evening, and:  increase the Novolog to 12 units with supper.  Please come back for a follow-up appointment in 1-2 months.

## 2019-08-01 ENCOUNTER — Other Ambulatory Visit: Payer: Self-pay

## 2019-08-01 ENCOUNTER — Telehealth: Payer: Self-pay | Admitting: Endocrinology

## 2019-08-01 ENCOUNTER — Ambulatory Visit (INDEPENDENT_AMBULATORY_CARE_PROVIDER_SITE_OTHER): Payer: 59 | Admitting: Endocrinology

## 2019-08-01 DIAGNOSIS — E119 Type 2 diabetes mellitus without complications: Secondary | ICD-10-CM

## 2019-08-01 MED ORDER — INSULIN LISPRO 100 UNIT/ML ~~LOC~~ SOLN: 16.0000 [IU] | Freq: Every day | SUBCUTANEOUS | 2 refills | Status: DC

## 2019-08-01 MED ORDER — LANTUS SOLOSTAR 100 UNIT/ML ~~LOC~~ SOPN: 56.0000 [IU] | PEN_INJECTOR | SUBCUTANEOUS | 11 refills | Status: DC

## 2019-08-01 NOTE — Progress Notes (Signed)
Subjective:    Patient ID: Alex Watson, male    DOB: July 19, 1965, 54 y.o.   MRN: [id number]  HPI  telehealth visit today via doxy video visit.  Alternatives to telehealth are presented to this patient, and the patient agrees to the telehealth visit. Pt is advised of the cost of the visit, and agrees to this, also.   Patient is in his camper (in [place]), and I am at the office.   Persons attending the telehealth visit: the patient and I.   Pt returns for f/u of diabetes mellitus: DM type: 1  Dx'ed: 7035 Complications: none.   Therapy: insulin since mid-2017.   DKA: never. Severe hypoglycemia: never.   Pancreatitis: never.  SDOH: he works out of town, in [employer]. Other: he had poor results with multiple daily injections; he has freestyle libre device; he says almost all of his calories are in the evening meal.  He awakens at 3:30 AM.   Interval history: Pt says cbg increased a few days ago (250-400).  It is in general highest at HS. He is unable to cite precip cause, such as steroids or illness.  He takes insulin as rx'ed.   Past Medical History:  Diagnosis Date  . Allergy   . Diabetes mellitus without complication ([place])   . Uncontrolled daytime somnolence 03/07/2013    Past Surgical History:  Procedure Laterality Date  . MANDIBLE SURGERY  1987    Social History   Socioeconomic History  . Marital status: Married    Spouse name: Not on file  . Number of children: Not on file  . Years of education: Not on file  . Highest education level: Not on file  Occupational History  . Not on file  Tobacco Use  . Smoking status: Current Every Day Smoker    Years: 24.00    Types: Cigarettes  . Smokeless tobacco: Never Used  Substance and Sexual Activity  . Alcohol use: Yes    Comment: BEER  . Drug use: No  . Sexual activity: Yes    Comment: number of sex partners in the last 12 months 1  Other Topics Concern  . Not on file  Social History Narrative  . Not on file    Social Determinants of Health   Financial Resource Strain:   . Difficulty of Paying Living Expenses:   Food Insecurity:   . Worried About Charity fundraiser in the Last Year:   . Arboriculturist in the Last Year:   Transportation Needs:   . Film/video editor (Medical):   Marland Kitchen Lack of Transportation (Non-Medical):   Physical Activity:   . Days of Exercise per Week:   . Minutes of Exercise per Session:   Stress:   . Feeling of Stress :   Social Connections:   . Frequency of Communication with Friends and Family:   . Frequency of Social Gatherings with Friends and Family:   . Attends Religious Services:   . Active Member of Clubs or Organizations:   . Attends Archivist Meetings:   Marland Kitchen Marital Status:   Intimate Partner Violence:   . Fear of Current or Ex-Partner:   . Emotionally Abused:   Marland Kitchen Physically Abused:   . Sexually Abused:     Current Outpatient Medications on File Prior to Visit  Medication Sig Dispense Refill  . atorvastatin (LIPITOR) 40 MG tablet Take 1 tablet (40 mg total) by mouth daily. 90 tablet 1  . blood  glucose meter kit and supplies KIT Dispense based on patient and insurance preference. Use up to four times daily as directed. (FOR ICD-9 250.00, 250.01). 1 each 0  . Continuous Blood Gluc Receiver (FREESTYLE LIBRE 14 DAY READER) DEVI USE AS DIRECTED 1 Device 0  . Continuous Blood Gluc Sensor (FREESTYLE LIBRE 14 DAY SENSOR) MISC 1 Device by Other route every 14 (fourteen) days. 6 each 3  . glucose blood (ONE TOUCH ULTRA TEST) test strip TEST four times a day 100 each 3  . Insulin Syringe-Needle U-100 (B-D INS SYR ULTRAFINE 1CC/30G) 30G X 1/2" 1 ML MISC USE AS DIRECTED TWICE DAILY 200 each 2  . ONETOUCH DELICA LANCETS FINE MISC TEST 1-4 TIMES PER DAY FOR GLUCOSE CHECK 300 each 3   No current facility-administered medications on file prior to visit.    Allergies  Allergen Reactions  . Morphine     REACTION: difficulty breathing    Family  History  Problem Relation Age of Onset  . Asthma Mother   . Arthritis Mother   . COPD Father   . Diabetes Father   . Heart disease Sister        eisenberger syndrome  . Lupus Sister     There were no vitals taken for this visit.   Review of Systems He denies hypoglycemia    Objective:   Physical Exam        Assessment & Plan:  Type 1 DM: worse   Patient Instructions  check your blood sugar 4 times a day: before the 3 meals, and at bedtime.  also check if you have symptoms of your blood sugar being too high or too low.  please keep a record of the readings and bring it to your next appointment here (or you can bring the meter itself).  You can write it on any piece of paper.  please call us sooner if your blood sugar goes below 70, or if you have a lot of readings over 200. Please reduce the Lantus to 48 units in the evening, and:  increase the Novolog to 12 units with supper.  Please come back for a follow-up appointment in 1-2 months.

## 2019-08-01 NOTE — Telephone Encounter (Signed)
Done

## 2019-08-01 NOTE — Telephone Encounter (Signed)
Patient requests to be called at ph# [phone] re: Patient is working in [place]. Patient has been having high blood sugars for the last 2 days (running about 350). Patient is scheduled for a virtual appointment on 08/03/19). Patient states he is concerned. Patient has never had this happen before.

## 2019-08-01 NOTE — Telephone Encounter (Signed)
Would you prefer to move VV from 3/25 to today?

## 2019-08-01 NOTE — Telephone Encounter (Signed)
Yes, please.

## 2019-08-01 NOTE — Telephone Encounter (Signed)
Called pt to move appt to today as requested by Dr. Everardo All. Routing to practice admin to add appt for today @ 330pm

## 2019-08-01 NOTE — Patient Instructions (Addendum)
check your blood sugar 4 times a day: before the 3 meals, and at bedtime.  also check if you have symptoms of your blood sugar being too high or too low.  please keep a record of the readings and bring it to your next appointment here (or you can bring the meter itself).  You can write it on any piece of paper.  please call us sooner if your blood sugar goes below 70, or if you have a lot of readings over 200. Please increase the Lantus to 56 units in the evening, and:  increase the Novolog to 16 units with supper. Please call back if this does not get better soon.   Please come back for a follow-up appointment in 1-2 months.

## 2019-08-03 ENCOUNTER — Ambulatory Visit: Payer: 59 | Admitting: Endocrinology

## 2019-08-29 ENCOUNTER — Other Ambulatory Visit: Payer: Self-pay

## 2019-08-29 DIAGNOSIS — E119 Type 2 diabetes mellitus without complications: Secondary | ICD-10-CM

## 2019-08-29 MED ORDER — "BD INSULIN SYRINGE U/F 30G X 1/2"" 1 ML MISC": 1.0000 | Freq: Two times a day (BID) | 0 refills | Status: DC

## 2019-11-06 ENCOUNTER — Other Ambulatory Visit: Payer: Self-pay

## 2019-11-06 ENCOUNTER — Telehealth: Payer: Self-pay | Admitting: Endocrinology

## 2019-11-06 ENCOUNTER — Other Ambulatory Visit: Payer: Self-pay | Admitting: Endocrinology

## 2019-11-06 DIAGNOSIS — E119 Type 2 diabetes mellitus without complications: Secondary | ICD-10-CM

## 2019-11-06 MED ORDER — "BD INSULIN SYRINGE U/F 30G X 1/2"" 1 ML MISC": 1.0000 | Freq: Every day | 0 refills | Status: DC

## 2019-11-06 MED ORDER — INSULIN LISPRO 100 UNIT/ML ~~LOC~~ SOLN: 16.0000 [IU] | Freq: Every day | SUBCUTANEOUS | 0 refills | Status: DC

## 2019-11-06 NOTE — Telephone Encounter (Signed)
Patient scheduled for follow up on 11/14/19 - requests for syringes and insulin to be called in.  [id number] [place], [place] - [phone] AT [facility] [address] Phone:  [phone]  Fax:  [phone]

## 2019-11-06 NOTE — Telephone Encounter (Signed)
E-Prescribing Status: Receipt confirmed by pharmacy (11/06/2019 10:32 AM EDT)

## 2019-11-14 ENCOUNTER — Other Ambulatory Visit: Payer: Self-pay

## 2019-11-14 ENCOUNTER — Telehealth: Payer: Self-pay

## 2019-11-14 ENCOUNTER — Telehealth (INDEPENDENT_AMBULATORY_CARE_PROVIDER_SITE_OTHER): Payer: 59 | Admitting: Endocrinology

## 2019-11-14 DIAGNOSIS — E119 Type 2 diabetes mellitus without complications: Secondary | ICD-10-CM

## 2019-11-14 MED ORDER — INSULIN LISPRO 100 UNIT/ML ~~LOC~~ SOLN: 16.0000 [IU] | Freq: Every day | SUBCUTANEOUS | 11 refills | Status: DC

## 2019-11-14 NOTE — Telephone Encounter (Signed)
Called patient to schedule A1C and he said he was going to give [place] a call when he knows his schedule.

## 2019-11-14 NOTE — Progress Notes (Signed)
Subjective:    Patient ID: Alex Watson, male    DOB: 17-Sep-1965, 54 y.o.   MRN: [id number]  HPI telehealth visit today via video visit.  Alternatives to telehealth are presented to this patient, and the patient agrees to the telehealth visit. Pt is advised of the cost of the visit, and agrees to this, also.   Patient is in his camper (in [place]), and I am at the office.   Persons attending the telehealth visit: the patient and I.   Pt returns for f/u of diabetes mellitus: DM type: 1  Dx'ed: 7654 Complications: none.   Therapy: insulin since mid-2017.   DKA: never. Severe hypoglycemia: never.   Pancreatitis: never.  SDOH: he works out of town, in [employer]. Other: he had poor results with multiple daily injections; he has freestyle libre device; he says almost all of his calories are in the evening meal.  He awakens at 3:30 AM.   Interval history:  Pt says cbg varies from 60-400.  A1c is in general highest at HS. He takes insulin as rx'ed. He has mild hypoglycemia approx once per month.  This happens in the late afternoon, when he is active at work.   Past Medical History:  Diagnosis Date  . Allergy   . Diabetes mellitus without complication ([place])   . Uncontrolled daytime somnolence 03/07/2013    Past Surgical History:  Procedure Laterality Date  . MANDIBLE SURGERY  1987    Social History   Socioeconomic History  . Marital status: Married    Spouse name: Not on file  . Number of children: Not on file  . Years of education: Not on file  . Highest education level: Not on file  Occupational History  . Not on file  Tobacco Use  . Smoking status: Current Every Day Smoker    Years: 24.00    Types: Cigarettes  . Smokeless tobacco: Never Used  Substance and Sexual Activity  . Alcohol use: Yes    Comment: BEER  . Drug use: No  . Sexual activity: Yes    Comment: number of sex partners in the last 12 months 1  Other Topics Concern  . Not on file  Social History  Narrative  . Not on file   Social Determinants of Health   Financial Resource Strain:   . Difficulty of Paying Living Expenses:   Food Insecurity:   . Worried About Charity fundraiser in the Last Year:   . Arboriculturist in the Last Year:   Transportation Needs:   . Film/video editor (Medical):   Marland Kitchen Lack of Transportation (Non-Medical):   Physical Activity:   . Days of Exercise per Week:   . Minutes of Exercise per Session:   Stress:   . Feeling of Stress :   Social Connections:   . Frequency of Communication with Friends and Family:   . Frequency of Social Gatherings with Friends and Family:   . Attends Religious Services:   . Active Member of Clubs or Organizations:   . Attends Archivist Meetings:   Marland Kitchen Marital Status:   Intimate Partner Violence:   . Fear of Current or Ex-Partner:   . Emotionally Abused:   Marland Kitchen Physically Abused:   . Sexually Abused:     Current Outpatient Medications on File Prior to Visit  Medication Sig Dispense Refill  . atorvastatin (LIPITOR) 40 MG tablet Take 1 tablet (40 mg total) by mouth daily. 90 tablet  1  . blood glucose meter kit and supplies KIT Dispense based on patient and insurance preference. Use up to four times daily as directed. (FOR ICD-9 250.00, 250.01). 1 each 0  . Continuous Blood Gluc Receiver (FREESTYLE LIBRE 14 DAY READER) DEVI USE AS DIRECTED 1 Device 0  . Continuous Blood Gluc Sensor (FREESTYLE LIBRE 14 DAY SENSOR) MISC 1 Device by Other route every 14 (fourteen) days. 6 each 3  . glucose blood (ONE TOUCH ULTRA TEST) test strip TEST four times a day 100 each 3  . insulin glargine (LANTUS SOLOSTAR) 100 UNIT/ML Solostar Pen Inject 56 Units into the skin every morning. 10 pen 11  . Insulin Syringe-Needle U-100 (B-D INS SYR ULTRAFINE 1CC/30G) 30G X 1/2" 1 ML MISC 1 each by Other route daily. E11.9 30 each 0  . ONETOUCH DELICA LANCETS FINE MISC TEST 1-4 TIMES PER DAY FOR GLUCOSE CHECK 300 each 3   No current  facility-administered medications on file prior to visit.    Allergies  Allergen Reactions  . Morphine     REACTION: difficulty breathing    Family History  Problem Relation Age of Onset  . Asthma Mother   . Arthritis Mother   . COPD Father   . Diabetes Father   . Heart disease Sister        eisenberger syndrome  . Lupus Sister     There were no vitals taken for this visit.   Review of Systems Denies LOC    Objective:   Physical Exam      Assessment & Plan:  Type 1 DM: uncertain glycemic control Hypoglycemia, due to insulin: this limits aggressiveness of glycemic control   Patient Instructions  check your blood sugar 4 times a day: before the 3 meals, and at bedtime.  also check if you have symptoms of your blood sugar being too high or too low.  please keep a record of the readings and bring it to your next appointment here (or you can bring the meter itself).  You can write it on any piece of paper.  please call us sooner if your blood sugar goes below 70, or if you have a lot of readings over 200.   Please continue the same insulins for now.   Please come in soon, to have the A1c checked.  Based on the result, we'll increase the Novolog, decrease the Lantus, or both.   Please come back for a follow-up appointment in 2 months.

## 2019-11-14 NOTE — Patient Instructions (Addendum)
check your blood sugar 4 times a day: before the 3 meals, and at bedtime.  also check if you have symptoms of your blood sugar being too high or too low.  please keep a record of the readings and bring it to your next appointment here (or you can bring the meter itself).  You can write it on any piece of paper.  please call us sooner if your blood sugar goes below 70, or if you have a lot of readings over 200.   Please continue the same insulins for now.   Please come in soon, to have the A1c checked.  Based on the result, we'll increase the Novolog, decrease the Lantus, or both.   Please come back for a follow-up appointment in 2 months.

## 2019-11-14 NOTE — Progress Notes (Signed)
Success Icon [id number] Invitation Sent Your invitation to [phone] has been sent. This invitation will expire in 30 minutes.  OK

## 2019-11-14 NOTE — Telephone Encounter (Signed)
Pt seen today for telephone visit with Dr. Everardo All. Per Dr. Everardo All, pt will require NURSE VISIT A1C, (NOT LAB APPT) week of 11/20/19 through 11/27/19. Routing this message to the front desk to ensure appt is scheduled correctly.

## 2019-11-15 ENCOUNTER — Telehealth (INDEPENDENT_AMBULATORY_CARE_PROVIDER_SITE_OTHER): Payer: 59 | Admitting: Family

## 2019-11-15 ENCOUNTER — Encounter: Payer: Self-pay | Admitting: Family

## 2019-11-15 DIAGNOSIS — J208 Acute bronchitis due to other specified organisms: Secondary | ICD-10-CM

## 2019-11-15 MED ORDER — ALBUTEROL SULFATE HFA 108 (90 BASE) MCG/ACT IN AERS
2.0000 | INHALATION_SPRAY | Freq: Four times a day (QID) | RESPIRATORY_TRACT | 1 refills | Status: DC | PRN
Start: 2019-11-15 — End: 2021-05-27

## 2019-11-15 MED ORDER — PROMETHAZINE-DM 6.25-15 MG/5ML PO SYRP
5.0000 mL | ORAL_SOLUTION | Freq: Four times a day (QID) | ORAL | 0 refills | Status: DC | PRN
Start: 2019-11-15 — End: 2020-03-13

## 2019-11-15 NOTE — Progress Notes (Signed)
Virtual Visit via Video   I connected with patient on 11/15/19 at  4:00 PM EDT by a video enabled telemedicine application and verified that I am speaking with the correct person using two identifiers.  Location patient: Home Location provider: [facility], Office Persons participating in the virtual visit: Patient, Provider, CMA  I discussed the limitations of evaluation and management by telemedicine and the availability of in person appointments. The patient expressed understanding and agreed to proceed.  Subjective:   HPI:   54 year old male with a history of Type 1 DM requests a video visit due to cough, chest congestion x 3 days that typically turns into bronchitis for him. He reports wife being sick with similar symptoms. No fever or chills. Cough productive, unsure of what color  ROS:   See pertinent positives and negatives per HPI.  Patient Active Problem List   Diagnosis Date Noted  . Well adult exam 05/03/2019  . Subarachnoid bleed ([place]) 01/04/2015  . Cerebral contusion and laceration with loss of consciousness ([place]) 01/04/2015  . Type 2 diabetes mellitus without complication ([place]) 67/59/1638  . Uncontrolled daytime somnolence 03/07/2013    Social History   Tobacco Use  . Smoking status: Current Every Day Smoker    Years: 24.00    Types: Cigarettes  . Smokeless tobacco: Never Used  Substance Use Topics  . Alcohol use: Yes    Comment: BEER    Current Outpatient Medications:  .  atorvastatin (LIPITOR) 40 MG tablet, Take 1 tablet (40 mg total) by mouth daily., Disp: 90 tablet, Rfl: 1 .  blood glucose meter kit and supplies KIT, Dispense based on patient and insurance preference. Use up to four times daily as directed. (FOR ICD-9 250.00, 250.01)., Disp: 1 each, Rfl: 0 .  Continuous Blood Gluc Receiver (FREESTYLE LIBRE 14 DAY READER) DEVI, USE AS DIRECTED, Disp: 1 Device, Rfl: 0 .  Continuous Blood Gluc Sensor (FREESTYLE LIBRE 14 DAY SENSOR) MISC, 1 Device  by Other route every 14 (fourteen) days., Disp: 6 each, Rfl: 3 .  glucose blood (ONE TOUCH ULTRA TEST) test strip, TEST four times a day, Disp: 100 each, Rfl: 3 .  insulin glargine (LANTUS SOLOSTAR) 100 UNIT/ML Solostar Pen, Inject 56 Units into the skin every morning., Disp: 10 pen, Rfl: 11 .  insulin lispro (HUMALOG) 100 UNIT/ML injection, Inject 0.16 mLs (16 Units total) into the skin daily with supper. E11.9, Disp: 10 mL, Rfl: 11 .  Insulin Syringe-Needle U-100 (B-D INS SYR ULTRAFINE 1CC/30G) 30G X 1/2" 1 ML MISC, 1 each by Other route daily. E11.9, Disp: 30 each, Rfl: 0 .  ONETOUCH DELICA LANCETS FINE MISC, TEST 1-4 TIMES PER DAY FOR GLUCOSE CHECK, Disp: 300 each, Rfl: 3 .  albuterol (VENTOLIN HFA) 108 (90 Base) MCG/ACT inhaler, Inhale 2 puffs into the lungs every 6 (six) hours as needed for wheezing or shortness of breath., Disp: 16 g, Rfl: 1 .  promethazine-dextromethorphan (PROMETHAZINE-DM) 6.25-15 MG/5ML syrup, Take 5 mLs by mouth 4 (four) times daily as needed for cough., Disp: 118 mL, Rfl: 0  Allergies  Allergen Reactions  . Morphine     REACTION: difficulty breathing    Objective:   There were no vitals taken for this visit.  Patient is well-developed, well-nourished in no acute distress.  Resting comfortably at home.  Head is normocephalic, atraumatic.  No labored breathing.  Speech is clear and coherent with logical content.  Patient is alert and oriented at baseline.    Assessment  and Plan:   Dujuan was seen today for cough.  Diagnoses and all orders for this visit:  Acute viral bronchitis  Other orders -     promethazine-dextromethorphan (PROMETHAZINE-DM) 6.25-15 MG/5ML syrup; Take 5 mLs by mouth 4 (four) times daily as needed for cough. -     albuterol (VENTOLIN HFA) 108 (90 Base) MCG/ACT inhaler; Inhale 2 puffs into the lungs every 6 (six) hours as needed for wheezing or shortness of breath.     Kennyth Arnold, FNP 11/15/2019

## 2019-11-19 ENCOUNTER — Other Ambulatory Visit: Payer: Self-pay | Admitting: Family Medicine

## 2019-11-19 DIAGNOSIS — E7800 Pure hypercholesterolemia, unspecified: Secondary | ICD-10-CM

## 2019-12-18 ENCOUNTER — Other Ambulatory Visit: Payer: Self-pay | Admitting: Endocrinology

## 2019-12-18 DIAGNOSIS — E119 Type 2 diabetes mellitus without complications: Secondary | ICD-10-CM

## 2019-12-26 ENCOUNTER — Other Ambulatory Visit: Payer: Self-pay

## 2019-12-27 ENCOUNTER — Other Ambulatory Visit: Payer: Self-pay

## 2019-12-27 ENCOUNTER — Ambulatory Visit: Payer: 59 | Admitting: Family Medicine

## 2019-12-27 ENCOUNTER — Encounter: Payer: Self-pay | Admitting: Family Medicine

## 2019-12-27 ENCOUNTER — Telehealth: Payer: Self-pay | Admitting: Endocrinology

## 2019-12-27 VITALS — BP 128/60 | HR 75 | Temp 97.2°F | Ht 76.0 in | Wt 213.2 lb

## 2019-12-27 DIAGNOSIS — Z720 Tobacco use: Secondary | ICD-10-CM | POA: Diagnosis not present

## 2019-12-27 DIAGNOSIS — E7800 Pure hypercholesterolemia, unspecified: Secondary | ICD-10-CM | POA: Diagnosis not present

## 2019-12-27 DIAGNOSIS — R0789 Other chest pain: Secondary | ICD-10-CM | POA: Diagnosis not present

## 2019-12-27 DIAGNOSIS — E119 Type 2 diabetes mellitus without complications: Secondary | ICD-10-CM | POA: Diagnosis not present

## 2019-12-27 DIAGNOSIS — Z794 Long term (current) use of insulin: Secondary | ICD-10-CM

## 2019-12-27 LAB — LIPID PANEL
Cholesterol: 122 mg/dL (ref 0–200)
HDL: 42.6 mg/dL (ref >39.00)
LDL Cholesterol: 70 mg/dL (ref 0–99)
NonHDL: 79.16
Total CHOL/HDL Ratio: 3
Triglycerides: 46 mg/dL (ref 0.0–149.0)
VLDL: 9.2 mg/dL (ref 0.0–40.0)

## 2019-12-27 LAB — BASIC METABOLIC PANEL
BUN: 9 mg/dL (ref 6–23)
CO2: 28 mEq/L (ref 19–32)
Calcium: 9.2 mg/dL (ref 8.4–10.5)
Chloride: 104 mEq/L (ref 96–112)
Creatinine, Ser: 0.8 mg/dL (ref 0.40–1.50)
GFR: 101.41 mL/min (ref >60.00)
Glucose, Bld: 111 mg/dL — ABNORMAL HIGH (ref 70–99)
Potassium: 4.4 mEq/L (ref 3.5–5.1)
Sodium: 139 mEq/L (ref 135–145)

## 2019-12-27 LAB — MICROALBUMIN / CREATININE URINE RATIO
Creatinine,U: 45.1 mg/dL
Microalb Creat Ratio: 1.6 mg/g (ref 0.0–30.0)
Microalb, Ur: <0.7 mg/dL (ref 0.0–1.9)

## 2019-12-27 LAB — HEMOGLOBIN A1C: Hgb A1c MFr Bld: 9.5 % — ABNORMAL HIGH (ref 4.6–6.5)

## 2019-12-27 LAB — ALT: ALT: 24 U/L (ref 0–53)

## 2019-12-27 LAB — AST: AST: 18 U/L (ref 0–37)

## 2019-12-27 MED ORDER — FREESTYLE LIBRE 14 DAY SENSOR MISC: 1.0000 | 0 refills | Status: DC

## 2019-12-27 MED ORDER — FLOVENT HFA 110 MCG/ACT IN AERO: 2.0000 | INHALATION_SPRAY | Freq: Two times a day (BID) | RESPIRATORY_TRACT | 3 refills | Status: DC

## 2019-12-27 MED ORDER — ATORVASTATIN CALCIUM 40 MG PO TABS: 40.0000 mg | ORAL_TABLET | Freq: Every day | ORAL | 3 refills | Status: DC

## 2019-12-27 MED ORDER — INSULIN LISPRO 100 UNIT/ML ~~LOC~~ SOLN: 18.0000 [IU] | Freq: Every day | SUBCUTANEOUS | 0 refills | Status: DC

## 2019-12-27 MED ORDER — LANTUS SOLOSTAR 100 UNIT/ML ~~LOC~~ SOPN: 55.0000 [IU] | PEN_INJECTOR | Freq: Every day | SUBCUTANEOUS | 0 refills | Status: DC

## 2019-12-27 NOTE — Telephone Encounter (Signed)
A1c=9.5%, which is higher.  Please increase the Novolog to 18 units with supper.

## 2019-12-27 NOTE — Progress Notes (Signed)
Chief Complaint  Patient presents with  . Establish Care    TOC from [facility] refills-atorvastatin    HPI: *Alex Watson is a 54 y.o. male here for DM, hyperlipidemia follow-up. Pt is overdue for labs.  He follows with Dr. Loanne Drilling and has an appt in 01/2020.   Pt has a CGM - average 180  Hypoglycemia/Hypergylcemic episodes: rarely, but overall he keeps it in check  Diet: "not the best" - pt travels a lot for work Exercise: noting regular/consistent He is a smoker. Has not had low dose CT chest.  He needs a refill of his atorvastatin.  He complains of a few months of "chest tightness". No SOB. No CP. No TTP. Pt is able to climb stairs and do what is needed for work but is more out of breath than he was 1 year ago.  He gets an occasional sharp pain in the lower right anterior chest, lasting few hours then resolves. Not incapacitating.  No n/v, diaphoresis. Normal BMs.   Lab Results  Component Value Date   HGBA1C 8.4 (A) 03/13/2019   Lab Results  Component Value Date   MICROALBUR 0.1 12/09/2016   Lab Results  Component Value Date   CREATININE 0.90 05/03/2019   Lab Results  Component Value Date   CHOL 194 05/03/2019   HDL 40.30 05/03/2019   LDLCALC 132 (H) 05/03/2019   TRIG 107.0 05/03/2019   CHOLHDL 5 05/03/2019    The 10-year ASCVD risk score Mikey Bussing DC Jr., et al., 2013) is: 18.9%   Values used to calculate the score:     Age: 68 years     Sex: Male     Is Non-Hispanic African American: No     Diabetic: Yes     Tobacco smoker: Yes     Systolic Blood Pressure: 122 mmHg     Is BP treated: No     HDL Cholesterol: 40.3 mg/dL     Total Cholesterol: 194 mg/dL   Past Medical History:  Diagnosis Date  . Allergy   . Diabetes mellitus without complication ([place])   . Uncontrolled daytime somnolence 03/07/2013    Past Surgical History:  Procedure Laterality Date  . MANDIBLE SURGERY  1987    Social History   Socioeconomic History  . Marital status:  Married    Spouse name: Not on file  . Number of children: Not on file  . Years of education: Not on file  . Highest education level: Not on file  Occupational History  . Not on file  Tobacco Use  . Smoking status: Current Every Day Smoker    Years: 24.00    Types: Cigarettes  . Smokeless tobacco: Never Used  Substance and Sexual Activity  . Alcohol use: Yes    Comment: BEER  . Drug use: No  . Sexual activity: Yes    Comment: number of sex partners in the last 12 months 1  Other Topics Concern  . Not on file  Social History Narrative  . Not on file   Social Determinants of Health   Financial Resource Strain:   . Difficulty of Paying Living Expenses:   Food Insecurity:   . Worried About Charity fundraiser in the Last Year:   . Arboriculturist in the Last Year:   Transportation Needs:   . Film/video editor (Medical):   Marland Kitchen Lack of Transportation (Non-Medical):   Physical Activity:   . Days of Exercise per Week:   .  Minutes of Exercise per Session:   Stress:   . Feeling of Stress :   Social Connections:   . Frequency of Communication with Friends and Family:   . Frequency of Social Gatherings with Friends and Family:   . Attends Religious Services:   . Active Member of Clubs or Organizations:   . Attends Archivist Meetings:   Marland Kitchen Marital Status:   Intimate Partner Violence:   . Fear of Current or Ex-Partner:   . Emotionally Abused:   Marland Kitchen Physically Abused:   . Sexually Abused:     Family History  Problem Relation Age of Onset  . Asthma Mother   . Arthritis Mother   . COPD Father   . Diabetes Father   . Heart disease Sister        eisenberger syndrome  . Lupus Sister      Immunization History  Administered Date(s) Administered  . Influenza,inj,Quad PF,6+ Mos 02/01/2015, 04/05/2018, 03/13/2019  . Pneumococcal Polysaccharide-23 05/03/2019    Outpatient Encounter Medications as of 12/27/2019  Medication Sig  . atorvastatin (LIPITOR) 40 MG  tablet Take 1 tablet (40 mg total) by mouth daily.  . blood glucose meter kit and supplies KIT Dispense based on patient and insurance preference. Use up to four times daily as directed. (FOR ICD-9 250.00, 250.01).  . Continuous Blood Gluc Receiver (FREESTYLE LIBRE 14 DAY READER) DEVI USE AS DIRECTED  . Continuous Blood Gluc Sensor (FREESTYLE LIBRE 14 DAY SENSOR) MISC 1 Device by Other route every 14 (fourteen) days.  Marland Kitchen glucose blood (ONE TOUCH ULTRA TEST) test strip TEST four times a day  . insulin glargine (LANTUS SOLOSTAR) 100 UNIT/ML Solostar Pen Inject 55 Units into the skin at bedtime. OVERDUE FOR LABS. WILL PROVIDE 30 DAY SUPPLY  . insulin lispro (HUMALOG) 100 UNIT/ML injection Inject 0.16 mLs (16 Units total) into the skin daily with supper. OVERDUE FOR LABS. WILL PROVIDE 30 DAY SUPPLY  . Insulin Syringe-Needle U-100 (B-D INS SYR ULTRAFINE 1CC/30G) 30G X 1/2" 1 ML MISC 1 each by Other route daily. E11.9  . ONETOUCH DELICA LANCETS FINE MISC TEST 1-4 TIMES PER DAY FOR GLUCOSE CHECK  . [DISCONTINUED] atorvastatin (LIPITOR) 40 MG tablet Take 1 tablet (40 mg total) by mouth daily. NEEDS APPT AND LABS FOR REFILLS  . albuterol (VENTOLIN HFA) 108 (90 Base) MCG/ACT inhaler Inhale 2 puffs into the lungs every 6 (six) hours as needed for wheezing or shortness of breath. (Patient not taking: Reported on 12/27/2019)  . fluticasone (FLOVENT HFA) 110 MCG/ACT inhaler Inhale 2 puffs into the lungs in the morning and at bedtime.  . promethazine-dextromethorphan (PROMETHAZINE-DM) 6.25-15 MG/5ML syrup Take 5 mLs by mouth 4 (four) times daily as needed for cough. (Patient not taking: Reported on 12/27/2019)   No facility-administered encounter medications on file as of 12/27/2019.     ROS: Pertinent positives and negatives noted in HPI. Remainder of ROS non-contributory    Allergies  Allergen Reactions  . Morphine     REACTION: difficulty breathing    BP 128/60 (BP Location: Left Arm)   Pulse 75    Temp (!) 97.2 F (36.2 C) (Tympanic)   Ht 6' 4"  (1.93 m)   Wt 213 lb 3.2 oz (96.7 kg)   SpO2 98%   BMI 25.95 kg/m   Physical Exam Constitutional:      General: He is not in acute distress.    Appearance: He is not toxic-appearing.  Cardiovascular:     Rate and Rhythm: Normal  rate and regular rhythm.     Pulses: Normal pulses.  Pulmonary:     Effort: Pulmonary effort is normal. No respiratory distress.     Breath sounds: Normal breath sounds. No wheezing.  Musculoskeletal:     Right lower leg: No edema.     Left lower leg: No edema.  Skin:    General: Skin is warm and dry.  Neurological:     Mental Status: He is alert and oriented to person, place, and time.  Psychiatric:        Mood and Affect: Mood normal.        Behavior: Behavior normal.      A/P:  1. Type 2 diabetes mellitus without complication, with long-term current use of insulin ([place]) - follows with endocrinology Dr. Loanne Drilling and has appt in 01/2020 - cont current meds and will forward A1C result to Dr. Loanne Drilling - ALT - AST - Basic metabolic panel - Lipid panel - Hemoglobin A1c - Microalbumin / creatinine urine ratio  2. Elevated LDL cholesterol level - Lipid panel - last LDL not at goal Refill: - atorvastatin (LIPITOR) 40 MG tablet; Take 1 tablet (40 mg total) by mouth daily.  Dispense: 90 tablet; Refill: 3 - may need to increase to 57m daily as last LDL 132 and goal < 70.   3. Tobacco use - CT CHEST LUNG CA SCREEN LOW DOSE W/O CM; Future  4. Feeling of chest tightness - consistent, symptoms x months, no associated symptoms  Rx: - fluticasone (FLOVENT HFA) 110 MCG/ACT inhaler; Inhale 2 puffs into the lungs in the morning and at bedtime.  Dispense: 1 each; Refill: 3 - 2-3 week trial to see if symptoms improve Discussed plan and reviewed medications with patient, including risks, benefits, and potential side effects. Pt expressed understand. All questions answered.   This visit occurred during the  SARS-CoV-2 public health emergency.  Safety protocols were in place, including screening questions prior to the visit, additional usage of staff PPE, and extensive cleaning of exam room while observing appropriate contact time as indicated for disinfecting solutions.

## 2019-12-27 NOTE — Telephone Encounter (Signed)
Outpatient Medication Detail   Disp Refills Start End   insulin glargine (LANTUS SOLOSTAR) 100 UNIT/ML Solostar Pen 18 mL 0 12/27/2019    Sig - Route: Inject 55 Units into the skin at bedtime. - Subcutaneous   Sent to pharmacy as: insulin glargine (LANTUS SOLOSTAR) 100 UNIT/ML Solostar Pen   E-Prescribing Status: Receipt confirmed by pharmacy (12/27/2019  5:00 PM EDT)    Outpatient Medication Detail   Disp Refills Start End   insulin lispro (HUMALOG) 100 UNIT/ML injection 6 mL 0 12/27/2019    Sig - Route: Inject 0.18 mLs (18 Units total) into the skin daily with supper. - Subcutaneous   Sent to pharmacy as: insulin lispro (HUMALOG) 100 UNIT/ML injection   E-Prescribing Status: Receipt confirmed by pharmacy (12/27/2019  5:00 PM EDT)    Above sent for 30 day supply. Next office visit 01/22/20

## 2019-12-27 NOTE — Telephone Encounter (Signed)
Outpatient Medication Detail   Disp Refills Start End   Continuous Blood Gluc Sensor (FREESTYLE LIBRE 14 DAY SENSOR) MISC 2 each 0 12/27/2019    Sig - Route: 1 each by Other route every 14 (fourteen) days. E11.9 - Other   Sent to pharmacy as: Continuous Blood Gluc Sensor (FREESTYLE LIBRE 14 DAY SENSOR) Misc   E-Prescribing Status: Receipt confirmed by pharmacy (12/27/2019 2:30 PM EDT)

## 2019-12-27 NOTE — Telephone Encounter (Signed)
Patient's wife Rinaldo Cloud called re: Patient is going to be out of town and needs RX's for the following medications. Rinaldo Cloud stated that patient had his labs done today at [facility]:  Medication Refill Request  Did you call your pharmacy and request this refill first?Yes . If patient has not contacted pharmacy first, instruct them to do so for future refills.  . Remind them that contacting the pharmacy for their refill is the quickest method to get the refill.  . Refill policy also stated that it will take anywhere between 24-72 hours to receive the refill.    Name of medication?  insulin glargine (LANTUS SOLOSTAR) 100 UNIT/ML Solostar Pen   AND insulin lispro (HUMALOG) 100 UNIT/ML injection AND Continuous Blood Gluc Sensor (FREESTYLE LIBRE 14 DAY SENSOR) MISC   Is this a 90 day supply? No  Name and location of pharmacy?  [id number] [address], [place] - [phone] AT [facility] [address] Phone:  [phone]  Fax:  [phone]       . Is the request for diabetes test strips? No . If yes, what brand? N/A

## 2019-12-27 NOTE — Telephone Encounter (Signed)
Last appt was VV on 7/6. You advised the following:  Please come in soon, to have the A1c checked.  Based on the result, we'll increase the Novolog, decrease the Lantus, or both.    Pt did not complete CBG until today. Results are elevated from 1 year ago. Next office visit 01/22/20. Please review pt request and advise of new dosage for Novolog and Lantus.

## 2019-12-28 ENCOUNTER — Ambulatory Visit: Payer: 59

## 2020-01-04 ENCOUNTER — Encounter: Payer: Self-pay | Admitting: Family Medicine

## 2020-01-13 ENCOUNTER — Other Ambulatory Visit: Payer: Self-pay | Admitting: Endocrinology

## 2020-01-13 DIAGNOSIS — E119 Type 2 diabetes mellitus without complications: Secondary | ICD-10-CM

## 2020-01-18 ENCOUNTER — Telehealth: Payer: Self-pay | Admitting: Endocrinology

## 2020-01-18 NOTE — Telephone Encounter (Signed)
error 

## 2020-01-22 ENCOUNTER — Ambulatory Visit: Payer: 59 | Admitting: Endocrinology

## 2020-02-05 ENCOUNTER — Other Ambulatory Visit: Payer: Self-pay | Admitting: Family Medicine

## 2020-02-17 ENCOUNTER — Other Ambulatory Visit: Payer: Self-pay | Admitting: Endocrinology

## 2020-02-17 DIAGNOSIS — E119 Type 2 diabetes mellitus without complications: Secondary | ICD-10-CM

## 2020-02-27 ENCOUNTER — Other Ambulatory Visit: Payer: Self-pay | Admitting: Endocrinology

## 2020-02-27 DIAGNOSIS — E119 Type 2 diabetes mellitus without complications: Secondary | ICD-10-CM

## 2020-03-13 ENCOUNTER — Other Ambulatory Visit: Payer: Self-pay

## 2020-03-13 ENCOUNTER — Encounter: Payer: Self-pay | Admitting: Endocrinology

## 2020-03-13 ENCOUNTER — Ambulatory Visit: Payer: 59 | Admitting: Endocrinology

## 2020-03-13 VITALS — BP 124/74 | HR 78 | Ht 76.0 in | Wt 209.0 lb

## 2020-03-13 DIAGNOSIS — E119 Type 2 diabetes mellitus without complications: Secondary | ICD-10-CM

## 2020-03-13 LAB — POCT GLYCOSYLATED HEMOGLOBIN (HGB A1C): Hemoglobin A1C: 8.7 % — AB (ref 4.0–5.6)

## 2020-03-13 MED ORDER — FREESTYLE LIBRE 14 DAY SENSOR MISC: 1.0000 | 3 refills | Status: DC

## 2020-03-13 MED ORDER — LANTUS SOLOSTAR 100 UNIT/ML ~~LOC~~ SOPN: 57.0000 [IU] | PEN_INJECTOR | Freq: Every day | SUBCUTANEOUS | 3 refills | Status: DC

## 2020-03-13 NOTE — Progress Notes (Signed)
Subjective:    Patient ID: Alex Watson, male    DOB: October 12, 1965, 54 y.o.   MRN: [id number]  HPI Pt returns for f/u of diabetes mellitus: DM type: 1  Dx'ed: 2016 Complications: none.   Therapy: insulin since mid-2017.   DKA: never. Severe hypoglycemia: never.   Pancreatitis: never.  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 Other: he had poor results with multiple daily injections; he has freestyle libre device; he says almost all of his daily calories are in the evening meal.  He awakens at 3:30 AM.   Interval history:  I reviewed continuous glucose monitor data.  Glucose varies from 50-400.  There is no trend throughout the day.  He takes insulin as rx'ed. He has mild hypoglycemia approx once per day.  This still happens in the late afternoon, when he is active at work. He takes lantus 55/d, and humalog 15 units with supper.   Past Medical History:  Diagnosis Date  . Allergy   . Diabetes mellitus without complication ([facility])   . Uncontrolled daytime somnolence 03/07/2013    Past Surgical History:  Procedure Laterality Date  . MANDIBLE SURGERY  1987    Social History   Socioeconomic History  . Marital status: Married    Spouse name: Not on file  . Number of children: Not on file  . Years of education: Not on file  . Highest education level: Not on file  Occupational History  . Not on file  Tobacco Use  . Smoking status: Current Every Day Smoker    Years: 24.00    Types: Cigarettes  . Smokeless tobacco: Never Used  Substance and Sexual Activity  . Alcohol use: Yes    Comment: BEER  . Drug use: No  . Sexual activity: Yes    Comment: number of sex partners in the last 12 months 1  Other Topics Concern  . Not on file  Social History Narrative  . Not on file   Social Determinants of Health   Financial Resource Strain:   . Difficulty of Paying Living Expenses: Not on file  Food Insecurity:   . Worried About Programme researcher, broadcasting/film/video in the Last Year: Not on file   . Ran Out of Food in the Last Year: Not on file  Transportation Needs:   . Lack of Transportation (Medical): Not on file  . Lack of Transportation (Non-Medical): Not on file  Physical Activity:   . Days of Exercise per Week: Not on file  . Minutes of Exercise per Session: Not on file  Stress:   . Feeling of Stress : Not on file  Social Connections:   . Frequency of Communication with Friends and Family: Not on file  . Frequency of Social Gatherings with Friends and Family: Not on file  . Attends Religious Services: Not on file  . Active Member of Clubs or Organizations: Not on file  . Attends Banker Meetings: Not on file  . Marital Status: Not on file  Intimate Partner Violence:   . Fear of Current or Ex-Partner: Not on file  . Emotionally Abused: Not on file  . Physically Abused: Not on file  . Sexually Abused: Not on file    Current Outpatient Medications on File Prior to Visit  Medication Sig Dispense Refill  . albuterol (VENTOLIN HFA) 108 (90 Base) MCG/ACT inhaler Inhale 2 puffs into the lungs every 6 (six) hours as needed for wheezing or shortness of breath. 16  g 1  . atorvastatin (LIPITOR) 40 MG tablet Take 1 tablet (40 mg total) by mouth daily. 90 tablet 3  . Continuous Blood Gluc Receiver (FREESTYLE LIBRE 14 DAY READER) DEVI USE AS DIRECTED 1 Device 0  . fluticasone (FLOVENT HFA) 110 MCG/ACT inhaler Inhale 2 puffs into the lungs in the morning and at bedtime. 1 each 3  . insulin lispro (HUMALOG) 100 UNIT/ML injection Inject 0.18 mLs (18 Units total) into the skin daily with supper. 6 mL 0  . Insulin Syringe-Needle U-100 (B-D INS SYR ULTRAFINE 1CC/30G) 30G X 1/2" 1 ML MISC 1 each by Other route daily. E11.9 30 each 0   No current facility-administered medications on file prior to visit.    Allergies  Allergen Reactions  . Morphine     REACTION: difficulty breathing    Family History  Problem Relation Age of Onset  . Asthma Mother   . Arthritis Mother    . COPD Father   . Diabetes Father   . Heart disease Sister        eisenberger syndrome  . Lupus Sister     BP 124/74   Pulse 78   Ht 6\' 4"  (1.93 m)   Wt 209 lb (94.8 kg)   SpO2 96%   BMI 25.44 kg/m    Review of Systems     Objective:   Physical Exam VITAL SIGNS:  See vs page GENERAL: no distress Pulses: dorsalis pedis intact bilat.   MSK: no deformity of the feet CV: no leg edema Skin:  no ulcer on the feet.  normal color and temp on the feet. Neuro: sensation is intact to touch on the feet EXT: right great toenail is absent.  Lab Results  Component Value Date   HGBA1C 8.7 (A) 03/13/2020       Assessment & Plan:  Type 1 DM: uncontrolled Hypoglycemia, due to insulin: we'll have to increase insulin slowly.    Patient Instructions  check your blood sugar 4 times a day: before the 3 meals, and at bedtime.  also check if you have symptoms of your blood sugar being too high or too low.  please keep a record of the readings and bring it to your next appointment here (or you can bring the meter itself).  You can write it on any piece of paper.  please call 13/07/2019 sooner if your blood sugar goes below 70, or if you have a lot of readings over 200.   I have sent a prescription to your pharmacy, to increase the Lantus, and: Please continue the same Humalog for now.  Try to make a big push to avoid low blood sugar by eating a light snack when you are active.   Please come back for a follow-up appointment in 3 months.

## 2020-03-13 NOTE — Patient Instructions (Signed)
check your blood sugar 4 times a day: before the 3 meals, and at bedtime.  also check if you have symptoms of your blood sugar being too high or too low.  please keep a record of the readings and bring it to your next appointment here (or you can bring the meter itself).  You can write it on any piece of paper.  please call us sooner if your blood sugar goes below 70, or if you have a lot of readings over 200.   I have sent a prescription to your pharmacy, to increase the Lantus, and: Please continue the same Humalog for now.  Try to make a big push to avoid low blood sugar by eating a light snack when you are active.   Please come back for a follow-up appointment in 3 months.

## 2020-03-15 ENCOUNTER — Other Ambulatory Visit: Payer: Self-pay | Admitting: Endocrinology

## 2020-03-15 DIAGNOSIS — E119 Type 2 diabetes mellitus without complications: Secondary | ICD-10-CM

## 2020-03-27 ENCOUNTER — Other Ambulatory Visit: Payer: Self-pay | Admitting: Endocrinology

## 2020-03-27 DIAGNOSIS — E119 Type 2 diabetes mellitus without complications: Secondary | ICD-10-CM

## 2020-03-27 NOTE — Telephone Encounter (Signed)
New message    *STAT* If patient is at the pharmacy, call can be transferred to refill team.   1. Which medications need to be refilled? (please list name of each medication and dose if known) insulin lispro (HUMALOG) 100 UNIT/ML injection & needles  2. Which pharmacy/location (including street and city if local pharmacy) is medication to be sent to? [facility] on [address]

## 2020-03-28 ENCOUNTER — Telehealth: Payer: Self-pay | Admitting: Endocrinology

## 2020-03-28 ENCOUNTER — Other Ambulatory Visit: Payer: Self-pay | Admitting: *Deleted

## 2020-03-28 DIAGNOSIS — E119 Type 2 diabetes mellitus without complications: Secondary | ICD-10-CM

## 2020-03-28 MED ORDER — INSULIN LISPRO 100 UNIT/ML ~~LOC~~ SOLN: SUBCUTANEOUS | 3 refills | Status: DC

## 2020-03-28 MED ORDER — "BD INSULIN SYRINGE U/F 30G X 1/2"" 1 ML MISC": 0 refills | Status: DC

## 2020-03-28 NOTE — Telephone Encounter (Signed)
Sent Rx to the Pharmacy  

## 2020-03-28 NOTE — Telephone Encounter (Signed)
Patient requests to be called at ph# [phone] re: a change that was made in patient's RX for Humalog

## 2020-06-14 ENCOUNTER — Other Ambulatory Visit: Payer: Self-pay

## 2020-06-14 ENCOUNTER — Telehealth (INDEPENDENT_AMBULATORY_CARE_PROVIDER_SITE_OTHER): Payer: 59 | Admitting: Endocrinology

## 2020-06-14 DIAGNOSIS — E119 Type 2 diabetes mellitus without complications: Secondary | ICD-10-CM

## 2020-06-14 MED ORDER — LANTUS SOLOSTAR 100 UNIT/ML ~~LOC~~ SOPN: 50.0000 [IU] | PEN_INJECTOR | Freq: Every day | SUBCUTANEOUS | 3 refills | Status: DC

## 2020-06-14 MED ORDER — INSULIN LISPRO 100 UNIT/ML ~~LOC~~ SOLN: SUBCUTANEOUS | 3 refills | Status: DC

## 2020-06-14 NOTE — Progress Notes (Signed)
Subjective:    Patient ID: Alex Watson, male    DOB: Sep 25, 1965, 55 y.o.   MRN: [id number]  HPI  telehealth visit today via video visit.  Alternatives to telehealth are presented to this patient, and the patient agrees to the telehealth visit. Pt is advised of the cost of the visit, and agrees to this, also.   Patient is in his car (in [place]), and I am at the office.   Persons attending the telehealth visit: the patient and I.   Pt returns for f/u of diabetes mellitus: DM type: 1  Dx'ed: 2016 Complications: none.   Therapy: insulin since mid-2017.   DKA: never. Severe hypoglycemia: never.   Pancreatitis: never.  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   Other: he had poor results with multiple daily injections; he has freestyle libre device; he says almost all of his daily calories are in the evening meal.   Interval history:  glucose varies from 70-400.  It is in general lowest in the afternoon.  He takes insulin as rx'ed. He has mild hypoglycemia approx QOD.  This still happens in the late afternoon, when he is active at work. He takes lantus 50/d, and humalog 18 units with supper.  Past Medical History:  Diagnosis Date  . Allergy   . Diabetes mellitus without complication ([facility])   . Uncontrolled daytime somnolence 03/07/2013    Past Surgical History:  Procedure Laterality Date  . MANDIBLE SURGERY  1987    Social History   Socioeconomic History  . Marital status: Married    Spouse name: Not on file  . Number of children: Not on file  . Years of education: Not on file  . Highest education level: Not on file  Occupational History  . Not on file  Tobacco Use  . Smoking status: Current Every Day Smoker    Years: 24.00    Types: Cigarettes  . Smokeless tobacco: Never Used  Substance and Sexual Activity  . Alcohol use: Yes    Comment: BEER  . Drug use: No  . Sexual activity: Yes    Comment: number of sex partners in the last  12 months 1  Other Topics Concern  . Not on file  Social History Narrative  . Not on file   Social Determinants of Health   Financial Resource Strain: Not on file  Food Insecurity: Not on file  Transportation Needs: Not on file  Physical Activity: Not on file  Stress: Not on file  Social Connections: Not on file  Intimate Partner Violence: Not on file    Current Outpatient Medications on File Prior to Visit  Medication Sig Dispense Refill  . albuterol (VENTOLIN HFA) 108 (90 Base) MCG/ACT inhaler Inhale 2 puffs into the lungs every 6 (six) hours as needed for wheezing or shortness of breath. 16 g 1  . atorvastatin (LIPITOR) 40 MG tablet Take 1 tablet (40 mg total) by mouth daily. 90 tablet 3  . Continuous Blood Gluc Receiver (FREESTYLE LIBRE 14 DAY READER) DEVI USE AS DIRECTED 1 Device 0  . Continuous Blood Gluc Sensor (FREESTYLE LIBRE 14 DAY SENSOR) MISC Apply 1 Device topically every 14 (fourteen) days. 6 each 3  . fluticasone (FLOVENT HFA) 110 MCG/ACT inhaler Inhale 2 puffs into the lungs in the morning and at bedtime. 1 each 3  . Insulin Syringe-Needle U-100 (B-D INS SYR ULTRAFINE 1CC/30G) 30G X 1/2" 1 ML MISC USE 1  SYRINGE/NEEDLE DAILY 30 each 0   No current facility-administered medications on file prior to visit.    Allergies  Allergen Reactions  . Morphine     REACTION: difficulty breathing    Family History  Problem Relation Age of Onset  . Asthma Mother   . Arthritis Mother   . COPD Father   . Diabetes Father   . Heart disease Sister        eisenberger syndrome  . Lupus Sister     There were no vitals taken for this visit.   Review of Systems Denies LOC    Objective:   Physical Exam      Assessment & Plan:  Type 1 DM: uncontrolled.  He declines to increase insulin now.   Hypoglycemia, due to insulin, so any insulin would have to be gradual.    Patient Instructions  check your blood sugar 4 times a day: before the 3 meals, and at bedtime.  also  check if you have symptoms of your blood sugar being too high or too low.  please keep a record of the readings and bring it to your next appointment here (or you can bring the meter itself).  You can write it on any piece of paper.  please call us sooner if your blood sugar goes below 70, or if you have a lot of readings over 200.   Please continue the same insulins for now.  Try to make a big push to avoid low blood sugar by eating a light snack when you are active.   Please come back for a follow-up appointment in 3 months.

## 2020-06-14 NOTE — Patient Instructions (Addendum)
check your blood sugar 4 times a day: before the 3 meals, and at bedtime.  also check if you have symptoms of your blood sugar being too high or too low.  please keep a record of the readings and bring it to your next appointment here (or you can bring the meter itself).  You can write it on any piece of paper.  please call us sooner if your blood sugar goes below 70, or if you have a lot of readings over 200.   Please continue the same insulins for now.  Try to make a big push to avoid low blood sugar by eating a light snack when you are active.   Please come back for a follow-up appointment in 3 months.

## 2020-10-31 ENCOUNTER — Other Ambulatory Visit: Payer: Self-pay

## 2020-10-31 ENCOUNTER — Ambulatory Visit: Payer: 59 | Admitting: Endocrinology

## 2020-10-31 VITALS — BP 130/82 | HR 76 | Ht 76.0 in | Wt 214.4 lb

## 2020-10-31 DIAGNOSIS — E119 Type 2 diabetes mellitus without complications: Secondary | ICD-10-CM

## 2020-10-31 LAB — POCT GLYCOSYLATED HEMOGLOBIN (HGB A1C): Hemoglobin A1C: 8.6 % — AB (ref 4.0–5.6)

## 2020-10-31 MED ORDER — FREESTYLE LIBRE 2 SENSOR MISC: 1.0000 | 3 refills | Status: DC

## 2020-10-31 MED ORDER — FREESTYLE LIBRE 2 READER DEVI: 1.0000 | Freq: Once | 1 refills | Status: AC

## 2020-10-31 MED ORDER — LANTUS SOLOSTAR 100 UNIT/ML ~~LOC~~ SOPN
48.0000 [IU] | PEN_INJECTOR | Freq: Every day | SUBCUTANEOUS | 3 refills | Status: DC
Start: 2020-10-31 — End: 2021-04-23

## 2020-10-31 MED ORDER — HUMALOG 100 UNIT/ML ~~LOC~~ SOLN: 20.0000 [IU] | Freq: Every day | SUBCUTANEOUS | 3 refills | Status: DC

## 2020-10-31 NOTE — Patient Instructions (Addendum)
check your blood sugar 4 times a day: before the 3 meals, and at bedtime.  also check if you have symptoms of your blood sugar being too high or too low.  please keep a record of the readings and bring it to your next appointment here (or you can bring the meter itself).  You can write it on any piece of paper.  please call us sooner if your blood sugar goes below 70, or if you have a lot of readings over 200.   Please change the insulins to the numbers listed below Try to make a big push to avoid low blood sugar by eating a light snack when you are active.   Please come back for a follow-up appointment in 3 months.

## 2020-10-31 NOTE — Progress Notes (Signed)
Subjective:    Patient ID: Alex Watson, male    DOB: 18-Jul-1965, 55 y.o.   MRN: [id number]  HPI Pt returns for f/u of diabetes mellitus: DM type: 1  Dx'ed: 2016 Complications: none.   Therapy: insulin since mid-2017.   DKA: never. Severe hypoglycemia: never.   Pancreatitis: never.  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   Other: he had poor results with multiple daily injections; he has freestyle libre device; he says almost all of his daily calories are in the evening meal.   Interval history:  I reviewed continuous glucose monitor data.  glucose varies from 50-400.  It is in general lowest at 3PM, and highest at [facility].  He takes insulins as rx'ed. He has mild hypoglycemia approx BIW.  This still happens in the late afternoon, when he is active at work.  Past Medical History:  Diagnosis Date   Allergy    Diabetes mellitus without complication (HCC)    Uncontrolled daytime somnolence 03/07/2013    Past Surgical History:  Procedure Laterality Date   MANDIBLE SURGERY  1987    Social History   Socioeconomic History   Marital status: Married    Spouse name: Not on file   Number of children: Not on file   Years of education: Not on file   Highest education level: Not on file  Occupational History   Not on file  Tobacco Use   Smoking status: Every Day    Years: 24.00    Pack years: 0.00    Types: Cigarettes   Smokeless tobacco: Never  Substance and Sexual Activity   Alcohol use: Yes    Comment: BEER   Drug use: No   Sexual activity: Yes    Comment: number of sex partners in the last 12 months 1  Other Topics Concern   Not on file  Social History Narrative   Not on file   Social Determinants of Health   Financial Resource Strain: Not on file  Food Insecurity: Not on file  Transportation Needs: Not on file  Physical Activity: Not on file  Stress: Not on file  Social Connections: Not on file  Intimate Partner  Violence: Not on file    Current Outpatient Medications on File Prior to Visit  Medication Sig Dispense Refill   albuterol (VENTOLIN HFA) 108 (90 Base) MCG/ACT inhaler Inhale 2 puffs into the lungs every 6 (six) hours as needed for wheezing or shortness of breath. 16 g 1   atorvastatin (LIPITOR) 40 MG tablet Take 1 tablet (40 mg total) by mouth daily. 90 tablet 3   fluticasone (FLOVENT HFA) 110 MCG/ACT inhaler Inhale 2 puffs into the lungs in the morning and at bedtime. 1 each 3   Insulin Syringe-Needle U-100 (B-D INS SYR ULTRAFINE 1CC/30G) 30G X 1/2" 1 ML MISC USE 1 SYRINGE/NEEDLE DAILY 30 each 0   No current facility-administered medications on file prior to visit.    Allergies  Allergen Reactions   Morphine     REACTION: difficulty breathing    Family History  Problem Relation Age of Onset   Asthma Mother    Arthritis Mother    COPD Father    Diabetes Father    Heart disease Sister        eisenberger syndrome   Lupus Sister     BP 130/82 (BP Location: Right Arm, Patient Position: Sitting, Cuff Size: Normal)   Pulse 76  Ht 6\' 4"  (1.93 m)   Wt 214 lb 6.4 oz (97.3 kg)   SpO2 97%   BMI 26.10 kg/m    Review of Systems     Objective:   Physical Exam VITAL SIGNS:  See vs page GENERAL: no distress Pulses: dorsalis pedis intact bilat.   MSK: no deformity of the feet CV: no leg edema Skin:  no ulcer on the feet.  normal color and temp on the feet. Neuro: sensation is intact to touch on the feet EXT: right great toenail is absent.  Lab Results  Component Value Date   HGBA1C 8.6 (A) 10/31/2020        Assessment & Plan:  Type 1 DM: uncontrolled. Hypoglycemia, due to insulin: The pattern of his cbg's indicates he needs some adjustment in his therapy.   Patient Instructions  check your blood sugar 4 times a day: before the 3 meals, and at bedtime.  also check if you have symptoms of your blood sugar being too high or too low.  please keep a record of the readings  and bring it to your next appointment here (or you can bring the meter itself).  You can write it on any piece of paper.  please call 11/02/2020 sooner if your blood sugar goes below 70, or if you have a lot of readings over 200.   Please change the insulins to the numbers listed below Try to make a big push to avoid low blood sugar by eating a light snack when you are active.   Please come back for a follow-up appointment in 3 months.

## 2020-12-09 ENCOUNTER — Other Ambulatory Visit: Payer: Self-pay

## 2020-12-10 ENCOUNTER — Ambulatory Visit: Payer: 59 | Admitting: Family Medicine

## 2021-01-24 ENCOUNTER — Encounter: Payer: 59 | Admitting: Family Medicine

## 2021-02-07 ENCOUNTER — Telehealth: Payer: 59 | Admitting: Endocrinology

## 2021-02-07 DIAGNOSIS — E119 Type 2 diabetes mellitus without complications: Secondary | ICD-10-CM

## 2021-02-07 MED ORDER — HUMALOG 100 UNIT/ML ~~LOC~~ SOLN: 22.0000 [IU] | Freq: Every day | SUBCUTANEOUS | 3 refills | Status: DC

## 2021-02-07 NOTE — Progress Notes (Signed)
Subjective:    Patient ID: Alex Watson, male    DOB: 01-Jun-1965, 55 y.o.   MRN: [id number]  HPI telehealth visit today via video visit.  Alternatives to telehealth are presented to this patient, and the patient agrees to the telehealth visit. Pt is advised of the cost of the visit, and agrees to this, also.   Patient is at work, and I am at the office.   Persons attending the telehealth visit: the patient and I Pt returns for f/u of diabetes mellitus: DM type: 1  Dx'ed: 2016 Complications: none.   Therapy: insulin since mid-2017.   DKA: never. Severe hypoglycemia: never.   Pancreatitis: never.  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   Other: he had poor results with multiple daily injections; he has FL2 CGM he says almost all of his daily calories are in the evening meal.   Interval history:  pt says glucose varies from 50-400.  It is in general highest at [facility].  He takes insulins as rx'ed. He has mild hypoglycemia approx once per month.  He works [employer], 6 days/week.   Past Medical History:  Diagnosis Date   Allergy    Diabetes mellitus without complication (HCC)    Uncontrolled daytime somnolence 03/07/2013    Past Surgical History:  Procedure Laterality Date   MANDIBLE SURGERY  1987    Social History   Socioeconomic History   Marital status: Married    Spouse name: Not on file   Number of children: Not on file   Years of education: Not on file   Highest education level: Not on file  Occupational History   Not on file  Tobacco Use   Smoking status: Every Day    Years: 24.00    Types: Cigarettes   Smokeless tobacco: Never  Substance and Sexual Activity   Alcohol use: Yes    Comment: BEER   Drug use: No   Sexual activity: Yes    Comment: number of sex partners in the last 12 months 1  Other Topics Concern   Not on file  Social History Narrative   Not on file   Social Determinants of Health   Financial Resource  Strain: Not on file  Food Insecurity: Not on file  Transportation Needs: Not on file  Physical Activity: Not on file  Stress: Not on file  Social Connections: Not on file  Intimate Partner Violence: Not on file    Current Outpatient Medications on File Prior to Visit  Medication Sig Dispense Refill   albuterol (VENTOLIN HFA) 108 (90 Base) MCG/ACT inhaler Inhale 2 puffs into the lungs every 6 (six) hours as needed for wheezing or shortness of breath. 16 g 1   atorvastatin (LIPITOR) 40 MG tablet Take 1 tablet (40 mg total) by mouth daily. 90 tablet 3   Continuous Blood Gluc Sensor (FREESTYLE LIBRE 2 SENSOR) MISC 1 Device by Does not apply route every 14 (fourteen) days. 6 each 3   fluticasone (FLOVENT HFA) 110 MCG/ACT inhaler Inhale 2 puffs into the lungs in the morning and at bedtime. 1 each 3   insulin glargine (LANTUS SOLOSTAR) 100 UNIT/ML Solostar Pen Inject 48 Units into the skin daily. 60 mL 3   Insulin Syringe-Needle U-100 (B-D INS SYR ULTRAFINE 1CC/30G) 30G X 1/2" 1 ML MISC USE 1 SYRINGE/NEEDLE DAILY 30 each 0   No current facility-administered medications on file prior to visit.    Allergies  Allergen Reactions   Morphine     REACTION: difficulty breathing    Family History  Problem Relation Age of Onset   Asthma Mother    Arthritis Mother    COPD Father    Diabetes Father    Heart disease Sister        eisenberger syndrome   Lupus Sister     There were no vitals taken for this visit.   Review of Systems     Objective:   Physical Exam      Assessment & Plan:  Type 1 DM: uncontrolled.   Hypoglycemia, due to insulin: we'll therefore have to increase insulin just slightly.   Patient Instructions  check your blood sugar 4 times a day: before the 3 meals, and at bedtime.  also check if you have symptoms of your blood sugar being too high or too low.  please keep a record of the readings and bring it to your next appointment here (or you can bring the meter  itself).  You can write it on any piece of paper.  please call us sooner if your blood sugar goes below 70, or if you have a lot of readings over 200.   Please increase the Humalog to 22 units with supper.   You should continue to avoid low blood sugar by eating a light snack when you are active.   Please come back for a follow-up appointment in 2 months.

## 2021-02-07 NOTE — Patient Instructions (Addendum)
check your blood sugar 4 times a day: before the 3 meals, and at bedtime.  also check if you have symptoms of your blood sugar being too high or too low.  please keep a record of the readings and bring it to your next appointment here (or you can bring the meter itself).  You can write it on any piece of paper.  please call us sooner if your blood sugar goes below 70, or if you have a lot of readings over 200.   Please increase the Humalog to 22 units with supper.   You should continue to avoid low blood sugar by eating a light snack when you are active.   Please come back for a follow-up appointment in 2 months.

## 2021-04-22 ENCOUNTER — Other Ambulatory Visit: Payer: Self-pay

## 2021-04-23 ENCOUNTER — Encounter: Payer: Self-pay | Admitting: Family Medicine

## 2021-04-23 ENCOUNTER — Ambulatory Visit (INDEPENDENT_AMBULATORY_CARE_PROVIDER_SITE_OTHER): Payer: 59 | Admitting: Family Medicine

## 2021-04-23 VITALS — BP 126/82 | HR 85 | Temp 97.9°F | Ht 77.0 in | Wt 222.0 lb

## 2021-04-23 DIAGNOSIS — Z720 Tobacco use: Secondary | ICD-10-CM | POA: Diagnosis not present

## 2021-04-23 DIAGNOSIS — Z23 Encounter for immunization: Secondary | ICD-10-CM | POA: Diagnosis not present

## 2021-04-23 DIAGNOSIS — E785 Hyperlipidemia, unspecified: Secondary | ICD-10-CM | POA: Insufficient documentation

## 2021-04-23 DIAGNOSIS — E119 Type 2 diabetes mellitus without complications: Secondary | ICD-10-CM | POA: Diagnosis not present

## 2021-04-23 DIAGNOSIS — E782 Mixed hyperlipidemia: Secondary | ICD-10-CM | POA: Diagnosis not present

## 2021-04-23 DIAGNOSIS — B079 Viral wart, unspecified: Secondary | ICD-10-CM | POA: Insufficient documentation

## 2021-04-23 DIAGNOSIS — Z1211 Encounter for screening for malignant neoplasm of colon: Secondary | ICD-10-CM

## 2021-04-23 MED ORDER — ATORVASTATIN CALCIUM 40 MG PO TABS
40.0000 mg | ORAL_TABLET | Freq: Every day | ORAL | 3 refills | Status: DC
Start: 2021-04-23 — End: 2022-05-05

## 2021-04-23 MED ORDER — VARENICLINE TARTRATE 0.5 MG PO TABS: ORAL_TABLET | ORAL | 0 refills | Status: AC

## 2021-04-23 MED ORDER — LANTUS SOLOSTAR 100 UNIT/ML ~~LOC~~ SOPN: 50.0000 [IU] | PEN_INJECTOR | Freq: Every day | SUBCUTANEOUS | Status: DC

## 2021-04-23 MED ORDER — VARENICLINE TARTRATE 1 MG PO TABS: 1.0000 mg | ORAL_TABLET | Freq: Two times a day (BID) | ORAL | 2 refills | Status: DC

## 2021-04-23 NOTE — Progress Notes (Signed)
[facility] [address] [place] [address] Dept: [phone] Dept Fax: [phone]  Transfer of Care Office Visit  Subjective:    Patient ID: Alex Watson, male    DOB: 1966/04/26, 55 y.o..   MRN: [id number]  Chief Complaint  Patient presents with   Establish Care    [facility]- establish care.  Fasting today. C/o having elevated HR off/on.      History of Present Illness:  Patient is in today to establish care. Alex Watson was born in [place], [place]. His father was in [employer], so the family moved around a bit. He has been in [place] for 30 years. 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 He now works for [employer]. This take shim out of town for long stretches of time. He has been married for 27 years Rinaldo Cloud). He has two children (14, 22). He denies drug use and has been 18 months off alcohol.   Alex Watson does smoke 1 to 1 1/2 ppd of cigarettes having started in his teens. He does desire to quit. He used Chantix in the past. He was having good success, until an insurance change caused him to stop his med for 2 months. When he tried the 2nd time, it seemed to not be effective. He would be willing to try it again.  Alex Watson has a history of Type 2 diabetes. He uses a CGM and is on Lantus 50 units daily and Humalog 22 units with the one meal he eats daily. His A1c in the past have been above goal.  Alex Watson has a history of hyperlipidemia. He is managed on atorvastatin.  Past Medical History: Patient Active Problem List   Diagnosis Date Noted   Tobacco use 04/23/2021   Hyperlipidemia 04/23/2021   Warts 04/23/2021   Osteoarthritis of spine with radiculopathy, cervical region 11/15/2015   Subarachnoid bleed (HCC) 01/04/2015   Cerebral contusion and laceration with loss of consciousness (HCC) 01/04/2015    Type 2 diabetes mellitus without complication (HCC) 08/18/2014   Past Surgical History:  Procedure Laterality Date   MANDIBLE SURGERY  1987   Family History  Problem Relation Age of Onset   Asthma Mother    Arthritis Mother    COPD Father    Diabetes Father    Heart disease Sister        eisenberger syndrome   Lupus Sister    Outpatient Medications Prior to Visit  Medication Sig Dispense Refill   albuterol (VENTOLIN HFA) 108 (90 Base) MCG/ACT inhaler Inhale 2 puffs into the lungs every 6 (six) hours as needed for wheezing or shortness of breath. 16 g 1   Continuous Blood Gluc Receiver (FREESTYLE LIBRE 2 READER) DEVI See admin instructions.     Continuous Blood Gluc Sensor (FREESTYLE LIBRE 2 SENSOR) MISC 1 Device by Does not apply route every 14 (fourteen) days. 6 each 3   insulin glargine (LANTUS SOLOSTAR) 100 UNIT/ML Solostar Pen Inject 48 Units into the skin daily. (Patient taking differently: Inject 50 Units into the skin daily.) 60 mL 3   insulin lispro (HUMALOG) 100 UNIT/ML injection Inject 0.22 mLs (22 Units total) into the skin daily with supper. 20 mL 3   Insulin Syringe-Needle U-100 (B-D INS SYR ULTRAFINE 1CC/30G) 30G X 1/2" 1 ML MISC USE 1 SYRINGE/NEEDLE DAILY 30 each 0   atorvastatin (  LIPITOR) 40 MG tablet Take 1 tablet (40 mg total) by mouth daily. (Patient not taking: Reported on 04/23/2021) 90 tablet 3   fluticasone (FLOVENT HFA) 110 MCG/ACT inhaler Inhale 2 puffs into the lungs in the morning and at bedtime. 1 each 3   No facility-administered medications prior to visit.   Allergies  Allergen Reactions   Morphine     REACTION: difficulty breathing     Objective:   Today's Vitals   04/23/21 1524  BP: 126/82  Pulse: 85  Temp: 97.9 F (36.6 C)  TempSrc: Temporal  SpO2: 98%  Weight: 222 lb (100.7 kg)  Height: 6\' 5"  (1.956 m)   Body mass index is 26.33 kg/m.   General: Well developed, well nourished. No acute distress. Psych: Alert and oriented. Normal  mood and affect.  Health Maintenance Due  Topic Date Due   HIV Screening  Never done   Hepatitis C Screening  Never done   TETANUS/TDAP  Never done   Zoster Vaccines- Shingrix (1 of 2) Never done   COLONOSCOPY (Pts 45-6yrs Insurance coverage will need to be confirmed)  05/14/2019   Pneumococcal Vaccine 68-24 Years old (2 - PCV) 05/02/2020   OPHTHALMOLOGY EXAM  05/10/2020   URINE MICROALBUMIN  12/26/2020   Lab Results Last lipids Lab Results  Component Value Date   CHOL 122 12/27/2019   HDL 42.60 12/27/2019   LDLCALC 70 12/27/2019   TRIG 46.0 12/27/2019   CHOLHDL 3 12/27/2019   Last hemoglobin A1c Lab Results  Component Value Date   HGBA1C 8.6 (A) 10/31/2020   Assessment & Plan:   1. Tobacco use I encouraged Alex Watson to follow through with another attempt at quitting. He is willing to go back on Chantix. I spent 4 minutes discussing tobacco cessation with the patient.  - varenicline (CHANTIX) 0.5 MG tablet; Take 1 tablet (0.5 mg total) by mouth daily for 3 days, THEN 1 tablet (0.5 mg total) 2 (two) times daily for 4 days.  Dispense: 11 tablet; Refill: 0 - varenicline (CHANTIX CONTINUING MONTH PAK) 1 MG tablet; Take 1 tablet (1 mg total) by mouth 2 (two) times daily.  Dispense: 60 tablet; Refill: 2  2. Type 2 diabetes mellitus without complication, without long-term current use of insulin (HCC) Due for annual DM labs. Alex Watson has an appointment with Alex Watson in early January. He also has an upcoming appointment for his annual eye exam.  - Microalbumin / creatinine urine ratio - Basic metabolic panel - Hemoglobin A1c - Urinalysis, Routine w reflex microscopic - insulin glargine (LANTUS SOLOSTAR) 100 UNIT/ML Solostar Pen; Inject 50 Units into the skin daily.  3. Mixed hyperlipidemia Due for repeat lipids.  - Lipid panel - atorvastatin (LIPITOR) 40 MG tablet; Take 1 tablet (40 mg total) by mouth daily.  Dispense: 90 tablet; Refill: 3  4. Screening for colon  cancer  - Ambulatory referral to Gastroenterology   February, MD

## 2021-04-24 ENCOUNTER — Encounter: Payer: Self-pay | Admitting: Family Medicine

## 2021-04-24 LAB — BASIC METABOLIC PANEL
BUN: 13 mg/dL (ref 6–23)
CO2: 26 mEq/L (ref 19–32)
Calcium: 9.3 mg/dL (ref 8.4–10.5)
Chloride: 98 mEq/L (ref 96–112)
Creatinine, Ser: 0.88 mg/dL (ref 0.40–1.50)
GFR: 98.12 mL/min (ref >60.00)
Glucose, Bld: 242 mg/dL — ABNORMAL HIGH (ref 70–99)
Potassium: 4.7 mEq/L (ref 3.5–5.1)
Sodium: 133 mEq/L — ABNORMAL LOW (ref 135–145)

## 2021-04-24 LAB — LIPID PANEL
Cholesterol: 195 mg/dL (ref 0–200)
HDL: 49 mg/dL (ref >39.00)
LDL Cholesterol: 128 mg/dL — ABNORMAL HIGH (ref 0–99)
NonHDL: 146.44
Total CHOL/HDL Ratio: 4
Triglycerides: 94 mg/dL (ref 0.0–149.0)
VLDL: 18.8 mg/dL (ref 0.0–40.0)

## 2021-04-24 LAB — HEMOGLOBIN A1C: Hgb A1c MFr Bld: 8.6 % — ABNORMAL HIGH (ref 4.6–6.5)

## 2021-04-25 ENCOUNTER — Other Ambulatory Visit: Payer: Self-pay | Admitting: Endocrinology

## 2021-04-25 ENCOUNTER — Encounter: Payer: Self-pay | Admitting: Family Medicine

## 2021-04-25 ENCOUNTER — Encounter: Payer: Self-pay | Admitting: Endocrinology

## 2021-04-25 MED ORDER — DEXCOM G6 TRANSMITTER MISC: 1.0000 | Freq: Once | 1 refills | Status: AC

## 2021-04-25 MED ORDER — DEXCOM G6 RECEIVER DEVI: 1.0000 | Freq: Once | 1 refills | Status: AC

## 2021-04-25 MED ORDER — DEXCOM G6 SENSOR MISC: 1.0000 | 3 refills | Status: DC

## 2021-04-28 ENCOUNTER — Telehealth: Payer: Self-pay

## 2021-04-28 ENCOUNTER — Other Ambulatory Visit (HOSPITAL_COMMUNITY): Payer: Self-pay

## 2021-04-28 ENCOUNTER — Encounter: Payer: Self-pay | Admitting: Family Medicine

## 2021-04-28 ENCOUNTER — Telehealth: Payer: Self-pay | Admitting: Endocrinology

## 2021-04-28 NOTE — Telephone Encounter (Signed)
PA for Vernicline Tartrate 1 mg tablets submitted through cover my meds. Awaiting response.  Dm/cma  (Key: [id number])

## 2021-04-28 NOTE — Telephone Encounter (Signed)
Patient spouse Rinaldo Cloud called re: Patient's insurance is requiring PA's required for DEXCOM G6 TRANSMITTER DEXCOM G6 RECEIVER AND  DEXCOM G6 SENSOR

## 2021-04-28 NOTE — Telephone Encounter (Signed)
PA was cancelled due to it is a cover medication on patients plan list of meds.  Called pharmacy and advise.  They are still stating that it needs a PA or his insurance will cover the Zyban.   Please review and advise. Thanks.  Dm/cma

## 2021-04-28 NOTE — Telephone Encounter (Signed)
Patient Advocate Encounter   Received notification from [facility] that prior authorization for Dexcom Transmitter is required by his/her insurance [employer].   PA submitted on 04/28/21  Key#: [id number]  Status is pending    [facility] will continue to follow:  Patient Advocate Fax:  [phone]    Received notification from [facility] that prior authorization for Dexcom Receiver is required by his/her insurance [employer].   PA submitted on 04/28/21  Key#: [id number]   Received notification from [facility] that prior authorization for Dexcom Sensors is required by his/her insurance [employer].   PA submitted on 04/28/21  Key#: [id number]  Status is pending

## 2021-04-29 ENCOUNTER — Other Ambulatory Visit (HOSPITAL_COMMUNITY): Payer: Self-pay

## 2021-04-29 LAB — HM DIABETES EYE EXAM

## 2021-04-29 NOTE — Telephone Encounter (Signed)
Patient Advocate Encounter  Prior Authorization for [employer] has been approved.    PA# PA Case ID: [id number] Effective dates: through 04/28/22  Per Test Claim PA is still required.   Spoke with Pharmacy to Process and they get the same msg.   Called the Ins to figure out what's going on. Automated system still says approved, but the rep I talked with said it was pending and needed Pharmacist review.   Will update as we receive more info.   Patient Advocate Fax:  [phone]

## 2021-04-29 NOTE — Telephone Encounter (Signed)
Faxed the insurance information to the pharmacy @4 :15 om  04/28/21.Dm/cma

## 2021-05-01 ENCOUNTER — Other Ambulatory Visit (HOSPITAL_COMMUNITY): Payer: Self-pay

## 2021-05-07 ENCOUNTER — Other Ambulatory Visit (HOSPITAL_COMMUNITY): Payer: Self-pay

## 2021-05-08 ENCOUNTER — Other Ambulatory Visit (HOSPITAL_COMMUNITY): Payer: Self-pay

## 2021-05-08 NOTE — Telephone Encounter (Signed)
Received notification from [employer] [facility]) regarding a prior authorization for DEXCOM G6 RECEIVER, TRANSMITTER, SENSOR. Authorization has been APPROVED from 12.29.22 to 12.29.23.   Per test claim with [facility], copay for 30 days supply is $125  Authorization # [id number]

## 2021-05-13 NOTE — Telephone Encounter (Signed)
PA for Varenciline 1 mg submitted through cover my meds to [employer].  Awaiting response.  Dm/cma   KeyAdelene Amas

## 2021-05-13 NOTE — Telephone Encounter (Signed)
Pt wife called about prior auth. [employer] is telling her they never received a request. Advised of notes below.   She noted ID # for [facility] is [id number] (non-matching to what is in Epic, I will update).   Please call [phone]

## 2021-05-13 NOTE — Telephone Encounter (Signed)
Patient's wife Olin Hauser requests to be called at ph# [phone] re: Status of PA for Dexcom [employer] and Receiver (states PA for Sensors has been approved per [place]).

## 2021-05-14 ENCOUNTER — Ambulatory Visit: Payer: 59 | Admitting: Endocrinology

## 2021-05-14 ENCOUNTER — Other Ambulatory Visit: Payer: Self-pay

## 2021-05-14 VITALS — BP 130/74 | HR 74 | Ht 77.0 in | Wt 222.2 lb

## 2021-05-14 DIAGNOSIS — E119 Type 2 diabetes mellitus without complications: Secondary | ICD-10-CM | POA: Diagnosis not present

## 2021-05-14 LAB — POCT GLYCOSYLATED HEMOGLOBIN (HGB A1C): Hemoglobin A1C: 8.5 % — AB (ref 4.0–5.6)

## 2021-05-14 MED ORDER — HUMALOG 100 UNIT/ML ~~LOC~~ SOLN: 27.0000 [IU] | Freq: Every day | SUBCUTANEOUS | 3 refills | Status: DC

## 2021-05-14 MED ORDER — LANTUS SOLOSTAR 100 UNIT/ML ~~LOC~~ SOPN: 45.0000 [IU] | PEN_INJECTOR | SUBCUTANEOUS | 3 refills | Status: DC

## 2021-05-14 NOTE — Progress Notes (Signed)
Subjective:    Patient ID: Alex Watson, male    DOB: Nov 02, 1965, 56 y.o.   MRN: [id number]  HPI Pt returns for f/u of diabetes mellitus: DM type: 1  Dx'ed: 2016 Complications: none.   Therapy: insulin since mid-2017.   DKA: never. Severe hypoglycemia: never.   Pancreatitis: never.  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 Other: he had poor results with multiple daily injections; he has FL2 CGM; he says almost all of his daily calories are in the evening meal.   Interval history: I reviewed continuous glucose monitor data.  glucose varies from 68-400.  It is in general highest at 3PM and 7PM, and lowest at 8AM.  He takes insulins as rx'ed.  He is working in town now.  He has mild hypoglycemia almost qd.  This happens in the afternoon.  Past Medical History:  Diagnosis Date   Allergy    Diabetes mellitus without complication (HCC)    Uncontrolled daytime somnolence 03/07/2013    Past Surgical History:  Procedure Laterality Date   MANDIBLE SURGERY  1987    Social History   Socioeconomic History   Marital status: Married    Spouse name: Not on file   Number of children: 2   Years of education: Not on file   Highest education level: Not on file  Occupational History   Occupation: [employer]    Comment: [employer]  Tobacco Use   Smoking status: Every Day    Packs/day: 1.00    Years: 24.00    Pack years: 24.00    Types: Cigarettes   Smokeless tobacco: Never  Vaping Use   Vaping Use: Never used  Substance and Sexual Activity   Alcohol use: Yes    Comment: BEER   Drug use: No   Sexual activity: Yes    Comment: number of sex partners in the last 12 months 1  Other Topics Concern   Not on file  Social History Narrative   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)   Social Determinants of Corporate investment banker Strain: Not on file  Food Insecurity: Not on file  Transportation Needs: Not on file  Physical  Activity: Not on file  Stress: Not on file  Social Connections: Not on file  Intimate Partner Violence: Not on file    Current Outpatient Medications on File Prior to Visit  Medication Sig Dispense Refill   albuterol (VENTOLIN HFA) 108 (90 Base) MCG/ACT inhaler Inhale 2 puffs into the lungs every 6 (six) hours as needed for wheezing or shortness of breath. 16 g 1   atorvastatin (LIPITOR) 40 MG tablet Take 1 tablet (40 mg total) by mouth daily. 90 tablet 3   Continuous Blood Gluc Sensor (DEXCOM G6 SENSOR) MISC 1 Device by Does not apply route See admin instructions. Change every 10 days 9 each 3   Insulin Syringe-Needle U-100 (B-D INS SYR ULTRAFINE 1CC/30G) 30G X 1/2" 1 ML MISC USE 1 SYRINGE/NEEDLE DAILY 30 each 0   varenicline (CHANTIX CONTINUING MONTH PAK) 1 MG tablet Take 1 tablet (1 mg total) by mouth 2 (two) times daily. 60 tablet 2   No current facility-administered medications on file prior to visit.    Allergies  Allergen Reactions   Morphine     REACTION: difficulty breathing    Family History  Problem Relation Age of Onset   Asthma Mother    Arthritis Mother    COPD  Father    Diabetes Father    Heart disease Sister        eisenberger syndrome   Lupus Sister     BP 130/74    Pulse 74    Ht 6\' 5"  (1.956 m)    Wt 222 lb 3.2 oz (100.8 kg)    SpO2 97%    BMI 26.35 kg/m    Review of Systems     Objective:   Physical Exam VITAL SIGNS:  See vs page GENERAL: no distress    A1c=8.5%    Assessment & Plan:  Type 1 DM: uncontrolled.  Hypoglycemia, due to insulin: this limits aggressiveness of glycemic control. He declines to see CDE, to address CGM probs.    Patient Instructions  check your blood sugar 4 times a day: before the 3 meals, and at bedtime.  also check if you have symptoms of your blood sugar being too high or too low.  please keep a record of the readings and bring it to your next appointment here (or you can bring the meter itself).  You can write it  on any piece of paper.  please call sooner if your blood sugar goes below 70, or if you have a lot of readings over 200.   Please change the insulins to the numbers listed below.   You should continue to avoid low blood sugar by eating a light snack when you are active.   Please come back for a follow-up appointment in 2 months.

## 2021-05-14 NOTE — Telephone Encounter (Signed)
Spoke to patients wife, Rinaldo Cloud, advised that once again PA was cancelled by insuranceddue to this medication or product is on your plan's list of covered drugs.  Gave her the number for pharmacy to call if they still have problems processing the prescription.  No further questions. diabetes mellitus c

## 2021-05-14 NOTE — Patient Instructions (Addendum)
check your blood sugar 4 times a day: before the 3 meals, and at bedtime.  also check if you have symptoms of your blood sugar being too high or too low.  please keep a record of the readings and bring it to your next appointment here (or you can bring the meter itself).  You can write it on any piece of paper.  please call us sooner if your blood sugar goes below 70, or if you have a lot of readings over 200.   Please change the insulins to the numbers listed below.   You should continue to avoid low blood sugar by eating a light snack when you are active.   Please come back for a follow-up appointment in 2 months.

## 2021-05-15 NOTE — Telephone Encounter (Signed)
Message sent thru MyChart to give them an update on Dexcom equipment.

## 2021-05-27 ENCOUNTER — Other Ambulatory Visit: Payer: Self-pay

## 2021-05-27 ENCOUNTER — Ambulatory Visit: Payer: 59 | Admitting: Family Medicine

## 2021-05-27 ENCOUNTER — Encounter: Payer: Self-pay | Admitting: Family Medicine

## 2021-05-27 VITALS — BP 134/70 | HR 85 | Temp 97.4°F | Ht 77.0 in | Wt 217.2 lb

## 2021-05-27 DIAGNOSIS — F5101 Primary insomnia: Secondary | ICD-10-CM | POA: Diagnosis not present

## 2021-05-27 DIAGNOSIS — H66001 Acute suppurative otitis media without spontaneous rupture of ear drum, right ear: Secondary | ICD-10-CM | POA: Diagnosis not present

## 2021-05-27 DIAGNOSIS — J069 Acute upper respiratory infection, unspecified: Secondary | ICD-10-CM

## 2021-05-27 MED ORDER — BENZONATATE 100 MG PO CAPS: 100.0000 mg | ORAL_CAPSULE | Freq: Two times a day (BID) | ORAL | 0 refills | Status: DC | PRN

## 2021-05-27 MED ORDER — TRAZODONE HCL 50 MG PO TABS: 25.0000 mg | ORAL_TABLET | Freq: Every evening | ORAL | 3 refills | Status: DC | PRN

## 2021-05-27 MED ORDER — AMOXICILLIN-POT CLAVULANATE 875-125 MG PO TABS: 1.0000 | ORAL_TABLET | Freq: Two times a day (BID) | ORAL | 0 refills | Status: DC

## 2021-05-27 NOTE — Progress Notes (Signed)
[facility] [address] [place] [address] Dept: [phone] Dept Fax: [phone]  Office Visit  Subjective:    Patient ID: Alex Watson, male    DOB: 20-Dec-1965, 56 y.o..   MRN: [id number]  Chief Complaint  Patient presents with   Acute Visit    C/o having a persistent cough and sinus pressure x 2 weeks. Also having issues sleeping.  Neg home covid test.   He has taken OTC allergy medication and cough drops with little relief.     History of Present Illness:  Patient is in today complaining of a 2-week history of a recurrent cough. He has had some intermittent sputum. His cough can be so bad at times that he vomits. He has not had any fever. He does note some right maxillary sinus pressure. He notes two nights ago, he developed some right jaw/periauricular pain. He continues to smoke cigarettes.  Additionally, during this time, he has noted poor sleep. He finds he falls asleep, but then wakes up within 3 hours and can't go back to sleep. He is only having 2 caffeinated drinks a day. Alex Watson is leaving town this afternoon for work. He also notes issues with palpitations at times and wants to have this evaluated.   Past Medical History: Patient Active Problem List   Diagnosis Date Noted   Tobacco use 04/23/2021   Hyperlipidemia 04/23/2021   Warts 04/23/2021   Osteoarthritis of spine with radiculopathy, cervical region 11/15/2015   Subarachnoid bleed (HCC) 01/04/2015   Cerebral contusion and laceration with loss of consciousness (HCC) 01/04/2015   Type 2 diabetes mellitus without complication (HCC) 08/18/2014   Past Surgical History:  Procedure Laterality Date   MANDIBLE SURGERY  1987   Family History  Problem Relation Age of Onset   Asthma Mother    Arthritis Mother    COPD Father    Diabetes Father    Heart disease Sister        eisenberger syndrome   Lupus Sister    Outpatient Medications Prior to Visit   Medication Sig Dispense Refill   atorvastatin (LIPITOR) 40 MG tablet Take 1 tablet (40 mg total) by mouth daily. 90 tablet 3   Continuous Blood Gluc Sensor (DEXCOM G6 SENSOR) MISC 1 Device by Does not apply route See admin instructions. Change every 10 days 9 each 3   insulin glargine (LANTUS SOLOSTAR) 100 UNIT/ML Solostar Pen Inject 45 Units into the skin every morning. 45 mL 3   insulin lispro (HUMALOG) 100 UNIT/ML injection Inject 0.27 mLs (27 Units total) into the skin daily with supper. 20 mL 3   Insulin Syringe-Needle U-100 (B-D INS SYR ULTRAFINE 1CC/30G) 30G X 1/2" 1 ML MISC USE 1 SYRINGE/NEEDLE DAILY 30 each 0   albuterol (VENTOLIN HFA) 108 (90 Base) MCG/ACT inhaler Inhale 2 puffs into the lungs every 6 (six) hours as needed for wheezing or shortness of breath. 16 g 1   varenicline (CHANTIX CONTINUING MONTH PAK) 1 MG tablet Take 1 tablet (1 mg total) by mouth 2 (two) times daily. 60 tablet 2   No facility-administered medications prior to visit.   Allergies  Allergen Reactions   Morphine     REACTION: difficulty breathing    Objective:   Today's Vitals   05/27/21 1056  BP: 134/70  Pulse: 85  Temp: (!) 97.4 F (36.3 C)  TempSrc: Temporal  SpO2: 97%  Weight: 217 lb 3.2 oz (98.5 kg)  Height: 6\' 5"  (1.956  m)   Body mass index is 25.76 kg/m.   General: Well developed, well nourished. No acute distress. HEENT: Normocephalic, non-traumatic. Conjunctiva clear. External ears normal. EAC clear   bilaterally, The right TM is bulging and injected. Mild pain on percussion over the right   maxillary sinus. Mucous membranes moist. Oropharynx clear. Good dentition. Lungs: Clear to auscultation bilaterally. No wheezing, rales or rhonchi. CV: RRR without murmurs or rubs. Pulses 2+ bilaterally. Psych: Alert and oriented. Normal mood and affect.  Health Maintenance Due  Topic Date Due   HIV Screening  Never done   Hepatitis C Screening  Never done   COLONOSCOPY (Pts 45-81yrs  Insurance coverage will need to be confirmed)  05/14/2019   Pneumococcal Vaccine 6-48 Years old (2 - PCV) 05/02/2020   OPHTHALMOLOGY EXAM  05/10/2020   URINE MICROALBUMIN  12/26/2020     Assessment & Plan:   1. Non-recurrent acute suppurative otitis media of right ear without spontaneous rupture of tympanic membrane I will treat his ear infection with Augmentin. His smoking likely has played a factor in this. I recommend he use some Afrin spray before his plane flights this week to reduce ear/sinus pain.  - amoxicillin-clavulanate (AUGMENTIN) 875-125 MG tablet; Take 1 tablet by mouth 2 (two) times daily.  Dispense: 20 tablet; Refill: 0  2. URI with cough and congestion I will provide some Tessalon for cough.  - benzonatate (TESSALON) 100 MG capsule; Take 1 capsule (100 mg total) by mouth 2 (two) times daily as needed for cough.  Dispense: 20 capsule; Refill: 0  3. Primary insomnia This may be a temporary issue related to his URI. I will provide some trazodone to see if this will help. I asked him to reschedule with me to address his other complaints. We will follow up on his sleep at that time.  - traZODone (DESYREL) 50 MG tablet; Take 0.5-1 tablets (25-50 mg total) by mouth at bedtime as needed for sleep.  Dispense: 30 tablet; Refill: 3    Loyola Mast, MD

## 2021-06-04 ENCOUNTER — Encounter: Payer: Self-pay | Admitting: Endocrinology

## 2021-06-05 ENCOUNTER — Telehealth: Payer: Self-pay | Admitting: Endocrinology

## 2021-06-05 DIAGNOSIS — E119 Type 2 diabetes mellitus without complications: Secondary | ICD-10-CM

## 2021-06-05 MED ORDER — LANTUS SOLOSTAR 100 UNIT/ML ~~LOC~~ SOPN: 45.0000 [IU] | PEN_INJECTOR | SUBCUTANEOUS | 3 refills | Status: DC

## 2021-06-05 NOTE — Telephone Encounter (Signed)
Pt is calling in stating the he needs a refill on Rx insulin glargine (LNATUS SOLOSTAR) 100 Unit/ML pt will be out of medication this weekend.  Pharm:  [facility] on [address]

## 2021-06-05 NOTE — Telephone Encounter (Signed)
RX now sent into preferred pharmacy

## 2021-06-17 ENCOUNTER — Encounter: Payer: Self-pay | Admitting: Family Medicine

## 2021-07-11 ENCOUNTER — Telehealth: Payer: Self-pay

## 2021-07-11 ENCOUNTER — Encounter: Payer: Self-pay | Admitting: Endocrinology

## 2021-07-11 ENCOUNTER — Other Ambulatory Visit (HOSPITAL_COMMUNITY): Payer: Self-pay

## 2021-07-11 NOTE — Telephone Encounter (Signed)
Patient Advocate Encounter ? ?Prior Authorization for [employer] has been approved.   ? ?PA# [id number] ? ?Effective dates: 07/11/21 through 07/12/22 ? ?Per Test Claim Patients co-pay is $35 (30 DS) ? ?Spoke with Pharmacy to Process. ? ?Patient Advocate ?Fax: [phone]  ?

## 2021-07-11 NOTE — Telephone Encounter (Signed)
Patient Advocate Encounter ?  ?Received notification from patient calls that prior authorization for Dexcom G6 sensors is required by his/her insurance [employer]. ?  ?PA submitted on 07/11/21 ? ?Key#: [id number] ? ?Status is pending ?   ?[facility] will continue to follow: ? ?Patient Advocate ?Fax: [phone]  ?

## 2021-07-25 ENCOUNTER — Telehealth: Payer: BC Managed Care – PPO | Admitting: Endocrinology

## 2021-07-25 ENCOUNTER — Other Ambulatory Visit: Payer: Self-pay

## 2021-07-25 ENCOUNTER — Encounter: Payer: Self-pay | Admitting: Endocrinology

## 2021-07-25 DIAGNOSIS — E119 Type 2 diabetes mellitus without complications: Secondary | ICD-10-CM

## 2021-07-25 MED ORDER — HUMALOG 100 UNIT/ML ~~LOC~~ SOLN: 28.0000 [IU] | Freq: Every day | SUBCUTANEOUS | 3 refills | Status: DC

## 2021-07-25 NOTE — Progress Notes (Signed)
? ?Subjective:  ? ? Patient ID: Alex Watson, male    DOB: 1966-02-17, 56 y.o.   MRN: [id number] ? ?HPI ?telehealth visit today via telephone x 12 minutes.  ?Alternatives to telehealth are presented to this patient, and the patient agrees to the telehealth visit. ?Pt is advised of the cost of the visit, and agrees to this, also.   ?Patient is at work In [place], and I am at the office.   ?Persons attending the telehealth visit: the patient and I ?Pt returns for f/u of diabetes mellitus: ?DM type: 1  ?Dx'ed: 2016 ?Complications: none.   ?Therapy: insulin since mid-2017.   ?DKA: never. ?Severe hypoglycemia: never.   ?Pancreatitis: never.  ?SDOH: he works out of town, in [place]; he works 6AM-8PM, 6-7 days a week. ?Other: he had poor results with multiple daily injections; he has  Dexcom CGM; he says almost all of his daily calories are in the evening meal.   ?Interval history: He takes insulins as rx'ed.  Pt says cbg varies from 70-180.  It is in general lowest in the afternoon, and highest PC (especially at HS).  He has mild hypoglycemia approx QOW.  This happens in the afternoon.  We are not able to access CGM data.  He expects to be back in [place] late 4/23.   ?Past Medical History:  ?Diagnosis Date  ? Allergy   ? Diabetes mellitus without complication (HCC)   ? Uncontrolled daytime somnolence 03/07/2013  ? ? ?Past Surgical History:  ?Procedure Laterality Date  ? MANDIBLE SURGERY  1987  ? ? ?Social History  ? ?Socioeconomic History  ? Marital status: Married  ?  Spouse name: Not on file  ? Number of children: 2  ? Years of education: Not on file  ? Highest education level: Not on file  ?Occupational History  ? Occupation: [employer]  ?  Comment: [employer]  ?Tobacco Use  ? Smoking status: Every Day  ?  Packs/day: 1.00  ?  Years: 24.00  ?  Pack years: 24.00  ?  Types: Cigarettes  ? Smokeless tobacco: Never  ?Vaping Use  ? Vaping Use: Never used  ?Substance and Sexual Activity  ? Alcohol use:  Yes  ?  Comment: BEER  ? Drug use: No  ? Sexual activity: Yes  ?  Comment: number of sex partners in the last 12 months 1  ?Other Topics Concern  ? Not on file  ?Social History Narrative  ? 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)  ? ?Social Determinants of Health  ? ?Financial Resource Strain: Not on file  ?Food Insecurity: Not on file  ?Transportation Needs: Not on file  ?Physical Activity: Not on file  ?Stress: Not on file  ?Social Connections: Not on file  ?Intimate Partner Violence: Not on file  ? ? ?Current Outpatient Medications on File Prior to Visit  ?Medication Sig Dispense Refill  ? atorvastatin (LIPITOR) 40 MG tablet Take 1 tablet (40 mg total) by mouth daily. 90 tablet 3  ? benzonatate (TESSALON) 100 MG capsule Take 1 capsule (100 mg total) by mouth 2 (two) times daily as needed for cough. 20 capsule 0  ? Continuous Blood Gluc Sensor (DEXCOM G6 SENSOR) MISC 1 Device by Does not apply route See admin instructions. Change every 10 days 9 each 3  ? insulin glargine (LANTUS SOLOSTAR) 100 UNIT/ML Solostar Pen Inject 45 Units into the skin every morning. 45 mL 3  ? Insulin Syringe-Needle U-100 (B-D INS SYR ULTRAFINE 1CC/30G)  30G X 1/2" 1 ML MISC USE 1 SYRINGE/NEEDLE DAILY 30 each 0  ? traZODone (DESYREL) 50 MG tablet Take 0.5-1 tablets (25-50 mg total) by mouth at bedtime as needed for sleep. 30 tablet 3  ? ?No current facility-administered medications on file prior to visit.  ? ? ?Allergies  ?Allergen Reactions  ? Morphine   ?  REACTION: difficulty breathing  ? ? ?Family History  ?Problem Relation Age of Onset  ? Asthma Mother   ? Arthritis Mother   ? COPD Father   ? Diabetes Father   ? Heart disease Sister   ?     eisenberger syndrome  ? Lupus Sister   ? ? ?Ht 6\' 5"  (1.956 m)   Wt 200 lb (90.7 kg)   BMI 23.72 kg/m?  ? ? ?Review of Systems ? ?   ?Objective:  ? Physical Exam ? ? ?   ?Assessment & Plan:  ?Type 1 DM: uncontrolled. ? ?Patient Instructions  ?Please increase the Humalog to 28 units with supper, and: ?Please  continue the same Lantus ?Please come back for a follow-up appointment in 6 weeks ? ? ? ?

## 2021-07-25 NOTE — Patient Instructions (Signed)
Please increase the Humalog to 28 units with supper, and: ?Please continue the same Lantus ?Please come back for a follow-up appointment in 6 weeks ? ?

## 2021-08-31 ENCOUNTER — Other Ambulatory Visit: Payer: Self-pay | Admitting: Endocrinology

## 2021-08-31 DIAGNOSIS — E119 Type 2 diabetes mellitus without complications: Secondary | ICD-10-CM

## 2021-09-01 ENCOUNTER — Telehealth: Payer: Self-pay

## 2021-09-01 ENCOUNTER — Other Ambulatory Visit: Payer: Self-pay | Admitting: Endocrinology

## 2021-09-01 MED ORDER — BASAGLAR KWIKPEN 100 UNIT/ML ~~LOC~~ SOPN: 45.0000 [IU] | PEN_INJECTOR | SUBCUTANEOUS | 1 refills | Status: DC

## 2021-09-01 MED ORDER — "INSULIN SYRINGE-NEEDLE U-100 30G X 1/2"" 1 ML MISC": 0 refills | Status: AC

## 2021-09-01 NOTE — Telephone Encounter (Signed)
Attempted to contact the patient regarding the alternative. ?

## 2021-09-01 NOTE — Telephone Encounter (Signed)
Patient recently switched insurance and they wont cover Lantus under the new plan. Would like to know if Basaglar maybe sent in as an alternative. Please advise.  ?

## 2021-09-04 ENCOUNTER — Other Ambulatory Visit: Payer: Self-pay | Admitting: Endocrinology

## 2021-09-04 ENCOUNTER — Encounter: Payer: Self-pay | Admitting: Endocrinology

## 2021-09-04 MED ORDER — INSULIN GLARGINE-YFGN 100 UNIT/ML ~~LOC~~ SOPN: 45.0000 [IU] | PEN_INJECTOR | SUBCUTANEOUS | 1 refills | Status: DC

## 2021-09-10 ENCOUNTER — Other Ambulatory Visit (HOSPITAL_COMMUNITY): Payer: Self-pay

## 2021-09-11 ENCOUNTER — Other Ambulatory Visit: Payer: Self-pay

## 2021-09-11 DIAGNOSIS — E119 Type 2 diabetes mellitus without complications: Secondary | ICD-10-CM

## 2021-09-11 MED ORDER — DEXCOM G6 SENSOR MISC: 1.0000 | 3 refills | Status: DC

## 2021-09-19 ENCOUNTER — Encounter: Payer: Self-pay | Admitting: Endocrinology

## 2021-09-29 ENCOUNTER — Ambulatory Visit: Payer: BC Managed Care – PPO | Admitting: Endocrinology

## 2021-10-24 ENCOUNTER — Ambulatory Visit: Payer: 59 | Admitting: Family Medicine

## 2021-10-31 ENCOUNTER — Encounter: Payer: Self-pay | Admitting: Family Medicine

## 2021-10-31 ENCOUNTER — Telehealth (INDEPENDENT_AMBULATORY_CARE_PROVIDER_SITE_OTHER): Payer: BC Managed Care – PPO | Admitting: Family Medicine

## 2021-10-31 VITALS — Ht 77.0 in | Wt 205.0 lb

## 2021-10-31 DIAGNOSIS — J40 Bronchitis, not specified as acute or chronic: Secondary | ICD-10-CM | POA: Diagnosis not present

## 2021-10-31 MED ORDER — HYDROCODONE BIT-HOMATROP MBR 5-1.5 MG/5ML PO SOLN: 5.0000 mL | Freq: Four times a day (QID) | ORAL | 0 refills | Status: DC | PRN

## 2021-10-31 NOTE — Progress Notes (Signed)
[facility] [address] [place] [address] Dept: [phone] Dept Fax: [phone]  Virtual Video Visit  I connected with Alex Watson on 10/31/21 at  4:00 PM EDT by a video enabled telemedicine application and verified that I am speaking with the correct person using two identifiers.  Location patient: Home Location provider: Clinic Persons participating in the virtual visit: Patient, Provider  I discussed the limitations of evaluation and management by telemedicine and the availability of in person appointments. The patient expressed understanding and agreed to proceed.  Chief Complaint  Patient presents with   Acute Visit    C/o having persistent dry cough, ST, chest tightness x 1 week. Taken Dayquil/Nyquil.     SUBJECTIVE:  HPI: Alex Watson is a 56 y.o. male who presents with a 10 day history of typical cold symptoms, followed by the development of cough about 7 days ago. He notes the cough is non-productive. He has had some associated chest tightness and sore throat. He has usually minor allergy symptoms, but this is not worse now. he denies fever. He does have some mild pos-tussive emesis at times. Mr. Ferring smokes about a pack of cigarettes a day. He has been using Dayquil/Nyquil.  Patient Active Problem List   Diagnosis Date Noted   Tobacco use 04/23/2021   Hyperlipidemia 04/23/2021   Warts 04/23/2021   Osteoarthritis of spine with radiculopathy, cervical region 11/15/2015   Subarachnoid bleed (HCC) 01/04/2015   Cerebral contusion and laceration with loss of consciousness (HCC) 01/04/2015   Type 2 diabetes mellitus without complication (HCC) 08/18/2014   Past Surgical History:  Procedure Laterality Date   MANDIBLE SURGERY  1987   Family History  Problem Relation Age of Onset   Asthma Mother    Arthritis Mother    COPD Father    Diabetes Father    Heart disease Sister        eisenberger syndrome    Lupus Sister    Social History   Tobacco Use   Smoking status: Every Day    Packs/day: 1.00    Years: 24.00    Total pack years: 24.00    Types: Cigarettes   Smokeless tobacco: Never  Vaping Use   Vaping Use: Never used  Substance Use Topics   Alcohol use: Yes    Comment: BEER   Drug use: No    Current Outpatient Medications:    atorvastatin (LIPITOR) 40 MG tablet, Take 1 tablet (40 mg total) by mouth daily., Disp: 90 tablet, Rfl: 3   Continuous Blood Gluc Sensor (DEXCOM G6 SENSOR) MISC, 1 Device by Does not apply route See admin instructions. Change every 10 days, Disp: 9 each, Rfl: 3   HYDROcodone bit-homatropine (HYCODAN) 5-1.5 MG/5ML syrup, Take 5 mLs by mouth every 6 (six) hours as needed for cough., Disp: 120 mL, Rfl: 0   insulin glargine-yfgn (SEMGLEE, YFGN,) 100 UNIT/ML Pen, Inject 45 Units into the skin every morning., Disp: 45 mL, Rfl: 1   insulin lispro (HUMALOG) 100 UNIT/ML injection, Inject 0.28 mLs (28 Units total) into the skin daily with supper., Disp: 30 mL, Rfl: 3   Insulin Syringe-Needle U-100 (B-D INS SYR ULTRAFINE 1CC/30G) 30G X 1/2" 1 ML MISC, USE 1 SYRINGE/NEEDLE DAILY, Disp: 30 each, Rfl: 0   traZODone (DESYREL) 50 MG tablet, Take 0.5-1 tablets (25-50 mg total) by mouth at bedtime as needed for sleep., Disp: 30 tablet, Rfl: 3   benzonatate (TESSALON) 100 MG capsule, Take 1  capsule (100 mg total) by mouth 2 (two) times daily as needed for cough., Disp: 20 capsule, Rfl: 0  Allergies  Allergen Reactions   Morphine     REACTION: difficulty breathing   ROS: See pertinent positives and negatives per HPI.  OBSERVATIONS/OBJECTIVE:  VITALS per patient if applicable: Today's Vitals   10/31/21 1555  Weight: 205 lb (93 kg)  Height: 6\' 5"  (1.956 m)   Body mass index is 24.31 kg/m.    GENERAL: Alert and oriented. Appears well and in no acute distress.  HEENT: Atraumatic. Eyes clear. No obvious abnormalities on inspection of external nose and ears.  NECK:  Normal movements of the head and neck.  LUNGS: On inspection, no signs of respiratory distress. Breathing rate appears normal. No obvious gross SOB, gasping or wheezing, and no conversational dyspnea.  CV: No obvious cyanosis.  MS: Moves all visible extremities without noticeable abnormality.  PSYCH/NEURO: Pleasant and cooperative. No obvious depression or anxiety. Speech and thought processing grossly intact.  ASSESSMENT AND PLAN:  1. Bronchitis Discussed natural history of viral bronchitis and the lack of evidence for improvement with antibiotics. Discussed home care for viral illness, including rest, pushing fluids, and OTC medications as needed for symptom relief. Recommend hot tea with honey for sore throat symptoms. Follow-up if needed for worsening or persistent symptoms.  - HYDROcodone bit-homatropine (HYCODAN) 5-1.5 MG/5ML syrup; Take 5 mLs by mouth every 6 (six) hours as needed for cough.  Dispense: 120 mL; Refill: 0  I discussed the assessment and treatment plan with the patient. The patient was provided an opportunity to ask questions and all were answered. The patient agreed with the plan and demonstrated an understanding of the instructions.   The patient was advised to call back or seek an in-person evaluation if the symptoms worsen or if the condition fails to improve as anticipated.  Return if symptoms worsen or fail to improve.   Loyola Mast, MD

## 2021-11-17 ENCOUNTER — Ambulatory Visit: Payer: BC Managed Care – PPO | Admitting: Endocrinology

## 2021-11-17 ENCOUNTER — Encounter: Payer: Self-pay | Admitting: Endocrinology

## 2021-11-17 VITALS — BP 118/80 | HR 84 | Ht 78.0 in | Wt 221.0 lb

## 2021-11-17 DIAGNOSIS — E1065 Type 1 diabetes mellitus with hyperglycemia: Secondary | ICD-10-CM

## 2021-11-17 DIAGNOSIS — E7800 Pure hypercholesterolemia, unspecified: Secondary | ICD-10-CM | POA: Diagnosis not present

## 2021-11-17 LAB — POCT GLYCOSYLATED HEMOGLOBIN (HGB A1C): Hemoglobin A1C: 9 % — AB (ref 4.0–5.6)

## 2021-11-17 NOTE — Progress Notes (Signed)
Patient ID: Alex Watson, male   DOB: 1966-04-23, 56 y.o.   MRN: [id number]           Reason for Appointment: Type I Diabetes follow-up   History of Present Illness   Diagnosis date: 2016  Previous history:  Insulin was started about 3 months after his diagnosis since oral agents failed Appears to be mostly on some form of glargine insulin along with mealtime insulin  A1c range in the last few years is: 8.2-9.8  Recent history:     Insulin regimen:  45 units Semglee hs       Humalog 20-25 PC supper    Side effects from medications: None  Current self management, blood sugar patterns and problems identified:  A1c is 9% compared to 8.5 in January He uses a syringe for his Humalog and insulin pen for his basal insulin Blood sugar patterns are discussed below and his CGM interpretation His blood sugars overnight are highly variable and he appears to be occasionally getting low sugars overnight also Although he does not eat breakfast and generally no lunch his blood sugars periodically will tend to rise in the morning hours especially midmorning and he is generally drinking a whole pot of coffee in the morning hours Blood sugars after dinner are somewhat variable and occasionally relatively low He is arbitrarily adjusting his Humalog based on type of meal with larger amounts with high carbohydrate meals such as pasta He generally takes his Humalog right after eating Also he thinks that he over treats any low sugars  Exercise: Diet management: Mostly not eating breakfast or lunch and usually eating dinner around 7 PM   Blood sugar pattern interpretation from his 2-week to 6 Dexcom download  Blood sugars are highly variable from day-to-day and by time of day HIGHEST blood sugars on an average are around 6 PM before dinner and lowest readings around 7-8 AM on average OVERNIGHT blood sugars are highly variable but generally above 180 to start with and somewhat lower by 1-2 AM and  rarely accompanied by low sugars around midnight Fasting readings are mostly high but highly variable again His blood sugars were much higher overnight in the first week of the data but better the second week POSTPRANDIAL readings after dinner are highly variable but generally somewhat lower than Premeal readings with occasional hypoglycemia Blood sugars also may periodically tend to rise late morning without any known food intake Occasional hyperglycemia is related to rebound excessively after low sugars in the morning Generally hypoglycemia has been minimal and occasionally occurring overnight or after dinner  CGM use % of time 93  2-week average/GV 183/40  Time in range        48%  % Time Above 180 31+19  % Time above 250   % Time Below 70 2     PRE-MEAL Fasting 12 PM Dinner 1-2 AM Overall  Glucose range:       Averages: 173 193 227 149    POST-MEAL PC Breakfast PC Lunch PC Dinner  Glucose range:   50-337  Averages:   03      Dietician visit: Most recent:      Weight control:  Wt Readings from Last 3 Encounters:  11/17/21 221 lb (100.2 kg)  10/31/21 205 lb (93 kg)  07/25/21 200 lb (90.7 kg)            Diabetes labs:  Lab Results  Component Value Date   HGBA1C 9.0 (A) 11/17/2021   HGBA1C  8.5 (A) 05/14/2021   HGBA1C 8.6 (H) 04/23/2021   Lab Results  Component Value Date   MICROALBUR <0.7 12/27/2019   LDLCALC 128 (H) 04/23/2021   CREATININE 0.88 04/23/2021     Allergies as of 11/17/2021       Reactions   Morphine    REACTION: difficulty breathing        Medication List        Accurate as of November 17, 2021  8:55 PM. If you have any questions, ask your nurse or doctor.          STOP taking these medications    HYDROcodone bit-homatropine 5-1.5 MG/5ML syrup Commonly known as: HYCODAN Stopped by: Reather Littler, MD       TAKE these medications    atorvastatin 40 MG tablet Commonly known as: LIPITOR Take 1 tablet (40 mg total) by mouth  daily.   benzonatate 100 MG capsule Commonly known as: TESSALON Take 1 capsule (100 mg total) by mouth 2 (two) times daily as needed for cough.   Dexcom G6 Sensor Misc 1 Device by Does not apply route See admin instructions. Change every 10 days   HumaLOG 100 UNIT/ML injection Generic drug: insulin lispro Inject 0.28 mLs (28 Units total) into the skin daily with supper.   insulin glargine-yfgn 100 UNIT/ML Pen Commonly known as: Semglee (yfgn) Inject 45 Units into the skin every morning. What changed: when to take this   Insulin Syringe-Needle U-100 30G X 1/2" 1 ML Misc Commonly known as: B-D INS SYR ULTRAFINE 1CC/30G USE 1 SYRINGE/NEEDLE DAILY   traZODone 50 MG tablet Commonly known as: DESYREL Take 0.5-1 tablets (25-50 mg total) by mouth at bedtime as needed for sleep.        Allergies:  Allergies  Allergen Reactions   Morphine     REACTION: difficulty breathing    Past Medical History:  Diagnosis Date   Allergy    Diabetes mellitus without complication (HCC)    Uncontrolled daytime somnolence 03/07/2013    Past Surgical History:  Procedure Laterality Date   MANDIBLE SURGERY  1987    Family History  Problem Relation Age of Onset   Asthma Mother    Arthritis Mother    COPD Father    Diabetes Father    Heart disease Sister        eisenberger syndrome   Lupus Sister     Social History:  reports that he has been smoking cigarettes. He has a 24.00 pack-year smoking history. He has never used smokeless tobacco. He reports current alcohol use. He reports that he does not use drugs.  Review of Systems:  Last diabetic eye exam date: 12/22, reportedly no retinopathy  Last foot exam date: 10/2020  Hypertension:   Not on treatment, ACE inhibitor or ARB  BP Readings from Last 3 Encounters:  11/17/21 118/80  05/27/21 134/70  05/14/21 130/74    Lipid management: Apparently starting Lipitor 40 mg from his PCP since 12/22 but no follow-up  available Baseline LDL usually around 130    Lab Results  Component Value Date   CHOL 195 04/23/2021   CHOL 122 12/27/2019   CHOL 194 05/03/2019   Lab Results  Component Value Date   HDL 49.00 04/23/2021   HDL 42.60 12/27/2019   HDL 40.30 05/03/2019   Lab Results  Component Value Date   LDLCALC 128 (H) 04/23/2021   LDLCALC 70 12/27/2019   LDLCALC 132 (H) 05/03/2019   Lab Results  Component Value Date  TRIG 94.0 04/23/2021   TRIG 46.0 12/27/2019   TRIG 107.0 05/03/2019   Lab Results  Component Value Date   CHOLHDL 4 04/23/2021   CHOLHDL 3 12/27/2019   CHOLHDL 5 05/03/2019   No results found for: "LDLDIRECT"   Examination:   BP 118/80   Pulse 84   Ht 6\' 6"  (1.981 m)   Wt 221 lb (100.2 kg)   SpO2 99%   BMI 25.54 kg/m   Body mass index is 25.54 kg/m.    ASSESSMENT/ PLAN:    Diabetes type 1 on basal bolus insulin:   Current regimen: Once a day basal insulin with glargine at night and mealtime Humalog taken postprandially  See history of present illness for detailed discussion of current diabetes management, blood sugar patterns and problems identified  A1c is 9%  Blood glucose control is consistently poor with his best A1c since 2018 being only 8.2  He has excessive variability of his blood sugars with his current insulin regimen This is partly related to his taking Lantus insulin once a day which appears to be causing variability in his blood sugars and sometimes low sugars overnight but tendency to mostly higher readings before dinner the next day With his drinking coffee in the morning his blood sugars may sometimes rise from this without any coverage This also may not always cover lunch if he is eating any After dinner blood sugars are variable and he is only arbitrarily adjusting his mealtime dose which is taken right after eating again causing more variability  Discussed the potential for using an insulin pump for much better control with stability  using a closed-loop system He says that because of his work he does not think he can get the pump securely on his arms or abdomen but is open to looking at the OmniPod system Meanwhile he will switch to the G7 Dexcom sensor  INSULIN changes: He will split the glargine insulin to 22 units twice a day instead of 45 units once a day When he starts getting more active during the day at work he can reduce the morning Lantus by as much as 4 units Start taking HUMALOG before starting to eat Take 5 to 7 units HUMALOG in the morning if blood sugars are starting to be at upper end of the target after coffee Make sure he covers Lantus with insulin also  Follow-up with diabetes educator to help adjust mealtime doses based on actual carbohydrates Also he will look into the OmniPod pump and discuss further with the nurse educator  Before refilling his Semglee insulin he will request 2019 for the TRESIBA U-200 insulin pen and likely can take the total dose once a day and discussed the differences with the Lantus and better 24-hour action with stable blood sugars with this  HYPERCHOLESTEROLEMIA: He has not had a follow-up since starting Lipitor and will check his lipids today Discussed need for statin treatment for cardiovascular risk reduction  Labs: Check chemistry panel and also needs follow-up microalbumin as this is overdue  Patient Instructions  Take Humalog BEFORE meals and ? 5-7 U for coffee  Call before out of pens to use Tresiba  Pen dose: 22 twice daily and reduce am dose ro 18 if more active   [place] 11/17/2021, 8:55 PM

## 2021-11-17 NOTE — Patient Instructions (Signed)
Take Humalog BEFORE meals and ? 5-7 U for coffee  Call before out of pens to use Tresiba  Pen dose: 22 twice daily and reduce am dose ro 18 if more active

## 2021-11-18 LAB — COMPREHENSIVE METABOLIC PANEL
ALT: 20 U/L (ref 0–53)
AST: 16 U/L (ref 0–37)
Albumin: 4.6 g/dL (ref 3.5–5.2)
Alkaline Phosphatase: 90 U/L (ref 39–117)
BUN: 12 mg/dL (ref 6–23)
CO2: 29 mEq/L (ref 19–32)
Calcium: 9.4 mg/dL (ref 8.4–10.5)
Chloride: 100 mEq/L (ref 96–112)
Creatinine, Ser: 0.83 mg/dL (ref 0.40–1.50)
GFR: 99.47 mL/min (ref >60.00)
Glucose, Bld: 140 mg/dL — ABNORMAL HIGH (ref 70–99)
Potassium: 4.3 mEq/L (ref 3.5–5.1)
Sodium: 136 mEq/L (ref 135–145)
Total Bilirubin: 0.7 mg/dL (ref 0.2–1.2)
Total Protein: 7.2 g/dL (ref 6.0–8.3)

## 2021-11-18 LAB — LIPID PANEL
Cholesterol: 133 mg/dL (ref 0–200)
HDL: 49.8 mg/dL (ref >39.00)
LDL Cholesterol: 68 mg/dL (ref 0–99)
NonHDL: 83.34
Total CHOL/HDL Ratio: 3
Triglycerides: 75 mg/dL (ref 0.0–149.0)
VLDL: 15 mg/dL (ref 0.0–40.0)

## 2021-11-18 LAB — MICROALBUMIN / CREATININE URINE RATIO
Creatinine,U: 74.2 mg/dL
Microalb Creat Ratio: 0.9 mg/g (ref 0.0–30.0)
Microalb, Ur: <0.7 mg/dL (ref 0.0–1.9)

## 2021-11-21 ENCOUNTER — Encounter: Payer: Self-pay | Admitting: Endocrinology

## 2021-12-11 ENCOUNTER — Other Ambulatory Visit (HOSPITAL_COMMUNITY): Payer: Self-pay

## 2021-12-11 ENCOUNTER — Other Ambulatory Visit: Payer: Self-pay

## 2021-12-11 DIAGNOSIS — E119 Type 2 diabetes mellitus without complications: Secondary | ICD-10-CM

## 2021-12-11 MED ORDER — DEXCOM G6 TRANSMITTER MISC: 3 refills | Status: DC

## 2021-12-30 ENCOUNTER — Encounter: Payer: Self-pay | Admitting: Family Medicine

## 2021-12-30 DIAGNOSIS — F5101 Primary insomnia: Secondary | ICD-10-CM

## 2022-01-01 ENCOUNTER — Other Ambulatory Visit: Payer: Self-pay | Admitting: Family Medicine

## 2022-01-01 ENCOUNTER — Encounter: Payer: Self-pay | Admitting: Endocrinology

## 2022-01-01 DIAGNOSIS — F5101 Primary insomnia: Secondary | ICD-10-CM

## 2022-01-01 DIAGNOSIS — E119 Type 2 diabetes mellitus without complications: Secondary | ICD-10-CM

## 2022-01-01 MED ORDER — TRAZODONE HCL 50 MG PO TABS: 25.0000 mg | ORAL_TABLET | Freq: Every evening | ORAL | 3 refills | Status: DC | PRN

## 2022-01-01 MED ORDER — DEXCOM G7 SENSOR MISC: 2 refills | Status: DC

## 2022-01-01 NOTE — Telephone Encounter (Signed)
Refill request for Trazodone 50 mg LR 05/27/21, #30, 3 rf LOV 05/27/21 FOV none scheduled.    Patient wants Rx sent tot [facility] on [address].  Please review and advise.  Thanks. Dm/cma

## 2022-02-09 ENCOUNTER — Ambulatory Visit: Payer: BC Managed Care – PPO | Admitting: Endocrinology

## 2022-03-09 ENCOUNTER — Encounter: Payer: Self-pay | Admitting: Endocrinology

## 2022-03-09 ENCOUNTER — Ambulatory Visit: Payer: BC Managed Care – PPO | Admitting: Endocrinology

## 2022-03-09 VITALS — BP 142/82 | HR 79 | Ht 78.0 in | Wt 220.2 lb

## 2022-03-09 DIAGNOSIS — E1065 Type 1 diabetes mellitus with hyperglycemia: Secondary | ICD-10-CM | POA: Diagnosis not present

## 2022-03-09 DIAGNOSIS — E119 Type 2 diabetes mellitus without complications: Secondary | ICD-10-CM

## 2022-03-09 LAB — POCT GLYCOSYLATED HEMOGLOBIN (HGB A1C): Hemoglobin A1C: 8 % — AB (ref 4.0–5.6)

## 2022-03-09 NOTE — Progress Notes (Unsigned)
Patient ID: Alex Watson, male   DOB: May 27, 1965, 56 y.o.   MRN: [id number]           Reason for Appointment: Type I Diabetes follow-up   History of Present Illness   Diagnosis date: 2016  Previous history:  Insulin was started about 3 months after his diagnosis since oral agents failed Appears to be mostly on some form of glargine insulin along with mealtime insulin  A1c range in the last few years is: 8.2-9.8  Recent history:     Insulin regimen:    Semglee 24 units twice a day Humalog 20-25 at supper    Side effects from medications: None  Current self management, blood sugar patterns and problems identified:  A1c is better at 8% although his GMI recently 6.7 Not using the Dexcom sensor He uses a syringe for his Humalog and insulin pen for his Semglee insulin He still has not finished his Semglee supply and has not switched to [place]  He was asked to look into his insulin pump but he says he does not want to do this as he thinks he will knock it off However he has switched to twice a day Semglee but appears to have increased the dose to a total of 48 units daily Also is taking somewhat arbitrary dose of 20-30 units of Humalog at suppertime, previously was told to make sure he takes it before eating With this he is getting frequent hypoglycemia after dinner Also his blood sugars are highly variable overnight either related to rebound or eating excessive snacks Does not exercise because of his job  Exercise: None Diet management: Mostly not eating breakfast or lunch and usually eating dinner around 7 PM   Blood sugar pattern interpretation from his 2-week  Dexcom 7 download  Blood sugars are highly variable overnight but relatively more steady during the day with more tendency to hypoglycemia in the evenings after about 6 PM  OVERNIGHT blood sugars are highly variable with periods of severe hyperglycemia early part of the night, occasionally in the target range but some  hypoglycemia may occur in the early night.  Between 12 AM-3 AM Blood sugars during the daytime are relatively stable averaging about 120-130 He has only 1 meal in the evening and postprandial readings after that are inconsistent and frequently low between 7 PM-12 AM HYPOGLYCEMIA occurs periodically in the afternoons, evenings after 7 PM and occasionally around 1-3 AM Also appears to have significant hyperglycemia following hypoglycemia HIGHEST blood sugars on an average are about 201 at 3-4 AM and lowest 8-9 AM  CGM use % of time 100  2-week average/GV 143/52  Time in range     67   %  % Time Above 180 13  % Time above 250 10  % Time Below 70 7+3     Previous data  CGM use % of time 93  2-week average/GV 183/40  Time in range        48%  % Time Above 180 31+19  % Time above 250   % Time Below 70 2     PRE-MEAL Fasting 12 PM Dinner 1-2 AM Overall  Glucose range:       Averages: 173 193 227 149    POST-MEAL PC Breakfast PC Lunch PC Dinner  Glucose range:   50-337  Averages:   03     Dietician visit: Most recent:   None  Weight control:  Wt Readings from Last 3 Encounters:  03/09/22  220 lb 3.2 oz (99.9 kg)  11/17/21 221 lb (100.2 kg)  10/31/21 205 lb (93 kg)            Diabetes labs:  Lab Results  Component Value Date   HGBA1C 9.0 (A) 11/17/2021   HGBA1C 8.5 (A) 05/14/2021   HGBA1C 8.6 (H) 04/23/2021   Lab Results  Component Value Date   MICROALBUR <0.7 11/17/2021   LDLCALC 68 11/17/2021   CREATININE 0.83 11/17/2021     Allergies as of 03/09/2022       Reactions   Morphine    REACTION: difficulty breathing        Medication List        Accurate as of March 09, 2022  8:40 PM. If you have any questions, ask your nurse or doctor.          atorvastatin 40 MG tablet Commonly known as: LIPITOR Take 1 tablet (40 mg total) by mouth daily.   benzonatate 100 MG capsule Commonly known as: TESSALON Take 1 capsule (100 mg total) by mouth 2  (two) times daily as needed for cough.   Dexcom G6 Transmitter Misc Replace every 3 months   Dexcom G7 Sensor Misc Change every 10 days   HumaLOG 100 UNIT/ML injection Generic drug: insulin lispro Inject 0.28 mLs (28 Units total) into the skin daily with supper.   insulin glargine-yfgn 100 UNIT/ML Pen Commonly known as: Semglee (yfgn) Inject 45 Units into the skin every morning. What changed: when to take this   Insulin Syringe-Needle U-100 30G X 1/2" 1 ML Misc Commonly known as: B-D INS SYR ULTRAFINE 1CC/30G USE 1 SYRINGE/NEEDLE DAILY   traZODone 50 MG tablet Commonly known as: DESYREL Take 0.5-1 tablets (25-50 mg total) by mouth at bedtime as needed for sleep.        Allergies:  Allergies  Allergen Reactions   Morphine     REACTION: difficulty breathing    Past Medical History:  Diagnosis Date   Allergy    Diabetes mellitus without complication ([place])    Uncontrolled daytime somnolence 03/07/2013    Past Surgical History:  Procedure Laterality Date   MANDIBLE SURGERY  1987    Family History  Problem Relation Age of Onset   Asthma Mother    Arthritis Mother    COPD Father    Diabetes Father    Heart disease Sister        eisenberger syndrome   Lupus Sister     Social History:  reports that he has been smoking cigarettes. He has a 24.00 pack-year smoking history. He has never used smokeless tobacco. He reports current alcohol use. He reports that he does not use drugs.  Review of Systems:  Last diabetic eye exam date: 12/22, reportedly no retinopathy  Last foot exam date: 10/2020  Hypertension:   Not on treatment, ACE inhibitor or ARB  BP Readings from Last 3 Encounters:  03/09/22 (!) 142/82  11/17/21 118/80  05/27/21 134/70    Lipid management: Apparently starting Lipitor 40 mg from his PCP since 12/22  Baseline LDL usually around 130 Last LDL was therapeutic    Lab Results  Component Value Date   CHOL 133 11/17/2021   CHOL 195  04/23/2021   CHOL 122 12/27/2019   Lab Results  Component Value Date   HDL 49.80 11/17/2021   HDL 49.00 04/23/2021   HDL 42.60 12/27/2019   Lab Results  Component Value Date   LDLCALC 68 11/17/2021   LDLCALC 128 (H)  04/23/2021   LDLCALC 70 12/27/2019   Lab Results  Component Value Date   TRIG 75.0 11/17/2021   TRIG 94.0 04/23/2021   TRIG 46.0 12/27/2019   Lab Results  Component Value Date   CHOLHDL 3 11/17/2021   CHOLHDL 4 04/23/2021   CHOLHDL 3 12/27/2019   No results found for: "LDLDIRECT"   Examination:   BP (!) 142/82   Pulse 79   Ht 6\' 6"  (1.981 m)   Wt 220 lb 3.2 oz (99.9 kg)   SpO2 93%   BMI 25.45 kg/m   Body mass index is 25.45 kg/m.    ASSESSMENT/ PLAN:    Diabetes type 1 on basal bolus insulin:   Current regimen: Once a day basal insulin with glargine at night and mealtime Humalog taken postprandially  See history of present illness for detailed discussion of current diabetes management, blood sugar patterns and problems identified  A1c is 8 %  Blood glucose control is still inadequate although A1c is better  He has excessive variability of his blood sugars with standard deviation 74   He is using twice a day glargine insulin but still blood sugars are quite variable especially overnight Also likely getting excessive insulin for covering his evening meal causing hypoglycemia Not counting carbohydrates Generally eating only 1 meal a day but possibly because of excessive basal insulin he will sometimes get low in the late afternoons also  INSULIN changes: He will reduce his basal insulin as follows: Semglee 20 in am and 22 at dinner  When finished switch to TRESIBA 40 u AT SUPPER once daily Again discussed the differences between the glargine and better 24-hour action with stable blood sugars with this ADJUST  to keep overnite sugar 80-130, discussed how to titrate this  Reduce humalog by 5 units overall for his main meal and make sure he  takes it before eating He will need to also keep a diary of his blood sugars and meal intake for 3 days as well as insulin doses to follow-up with the diabetes educator Discussed use of the insulin pump but he is reluctant to do this now  Consultation with diabetes educator  TRESIBA U-200 insulin pen to be started with 40 units once daily in the evening   HYPERCHOLESTEROLEMIA: He has better control now  Patient Instructions  Semglee 20 in am and 22 at dinner  TRESIBA 40 u AT SUPPER once daily ADJUST  to keep overnite sugar 80-130  Reduce humalog by 5 units overall   Jarold Macomber 03/09/2022, 8:40 PM

## 2022-03-09 NOTE — Patient Instructions (Addendum)
Semglee 20 in am and 22 at dinner  TRESIBA 40 u AT SUPPER once daily ADJUST  to keep overnite sugar 80-130  Reduce humalog by 5 units overall

## 2022-03-10 ENCOUNTER — Encounter: Payer: Self-pay | Admitting: Endocrinology

## 2022-03-11 ENCOUNTER — Telehealth: Payer: Self-pay

## 2022-03-11 NOTE — Telephone Encounter (Signed)
Patient's spouse LVM with questions regarding insulin changes called to inform them that per last OV notes:  INSULIN changes: He will reduce his basal insulin as follows: Semglee 20 in am and 22 at dinner   When finished switch to TRESIBA 40 u AT SUPPER once daily.  Lvm for a call back.

## 2022-03-13 ENCOUNTER — Other Ambulatory Visit: Payer: Self-pay | Admitting: Endocrinology

## 2022-03-16 ENCOUNTER — Encounter: Payer: Self-pay | Admitting: Family Medicine

## 2022-03-16 ENCOUNTER — Ambulatory Visit: Payer: BC Managed Care – PPO | Admitting: Family Medicine

## 2022-03-16 VITALS — BP 114/76 | Temp 98.3°F | Ht 78.0 in | Wt 223.2 lb

## 2022-03-16 DIAGNOSIS — J4521 Mild intermittent asthma with (acute) exacerbation: Secondary | ICD-10-CM

## 2022-03-16 DIAGNOSIS — J22 Unspecified acute lower respiratory infection: Secondary | ICD-10-CM

## 2022-03-16 MED ORDER — BENZONATATE 200 MG PO CAPS: 200.0000 mg | ORAL_CAPSULE | Freq: Two times a day (BID) | ORAL | 0 refills | Status: DC | PRN

## 2022-03-16 MED ORDER — ALBUTEROL SULFATE HFA 108 (90 BASE) MCG/ACT IN AERS: 1.0000 | INHALATION_SPRAY | Freq: Four times a day (QID) | RESPIRATORY_TRACT | 2 refills | Status: DC | PRN

## 2022-03-16 MED ORDER — DOXYCYCLINE HYCLATE 100 MG PO TABS: 100.0000 mg | ORAL_TABLET | Freq: Two times a day (BID) | ORAL | 0 refills | Status: AC

## 2022-03-16 MED ORDER — PREDNISONE 20 MG PO TABS: 20.0000 mg | ORAL_TABLET | Freq: Two times a day (BID) | ORAL | 0 refills | Status: AC

## 2022-03-16 NOTE — Progress Notes (Signed)
Established Patient Office Visit  Subjective   Patient ID: Alex Watson, male    DOB: 1965-12-01  Age: 56 y.o. MRN: [id number]  Chief Complaint  Patient presents with   Cough    Pt co/ coughing x 3 weeks with pain in the chest.    HPI 3-week history of a cough productive of phlegm.  There is been wheezing and tightness in his chest.  His neck muscles have been sore.  Been no fever chills or current upper respiratory tract symptoms.  He has no history of asthma but does have a history of bronchitis.  Currently smoking 1 pack of cigarettes daily.  He is an insulin-dependent diabetic.    Review of Systems  Constitutional: Negative.   HENT: Negative.    Eyes:  Negative for blurred vision, discharge and redness.  Respiratory:  Positive for cough, sputum production and wheezing.   Cardiovascular: Negative.   Gastrointestinal:  Negative for abdominal pain.  Genitourinary: Negative.   Musculoskeletal: Negative.  Negative for myalgias.  Skin:  Negative for rash.  Neurological:  Negative for tingling, loss of consciousness and weakness.  Endo/Heme/Allergies:  Negative for polydipsia.      Objective:     BP 114/76 (BP Location: Left Arm, Patient Position: Sitting, Cuff Size: Large)   Temp 98.3 F (36.8 C) (Temporal)   Ht 6\' 6"  (1.981 m)   Wt 223 lb 3.2 oz (101.2 kg)   BMI 25.79 kg/m    Physical Exam Constitutional:      General: He is not in acute distress.    Appearance: Normal appearance. He is not ill-appearing, toxic-appearing or diaphoretic.  HENT:     Head: Normocephalic and atraumatic.     Right Ear: External ear normal.     Left Ear: External ear normal.     Mouth/Throat:     Mouth: Mucous membranes are moist.     Pharynx: Oropharynx is clear. No oropharyngeal exudate or posterior oropharyngeal erythema.  Eyes:     General: No scleral icterus.       Right eye: No discharge.        Left eye: No discharge.     Extraocular Movements: Extraocular movements intact.      Conjunctiva/sclera: Conjunctivae normal.     Pupils: Pupils are equal, round, and reactive to light.  Cardiovascular:     Rate and Rhythm: Normal rate and regular rhythm.  Pulmonary:     Effort: Pulmonary effort is normal. No respiratory distress.     Breath sounds: Decreased air movement present. Examination of the right-lower field reveals rhonchi. Examination of the left-lower field reveals rhonchi. Wheezing and rhonchi present.  Abdominal:     General: Bowel sounds are normal.     Tenderness: There is no abdominal tenderness. There is no guarding.  Musculoskeletal:     Cervical back: No rigidity or tenderness.  Skin:    General: Skin is warm and dry.  Neurological:     Mental Status: He is alert and oriented to person, place, and time.  Psychiatric:        Mood and Affect: Mood normal.        Behavior: Behavior normal.      No results found for any visits on 03/16/22.    The 10-year ASCVD risk score (Arnett DK, et al., 2019) is: 9.4%    Assessment & Plan:   Problem List Items Addressed This Visit   None Visit Diagnoses     Lower respiratory tract infection    -  Primary   Relevant Medications   doxycycline (VIBRA-TABS) 100 MG tablet   benzonatate (TESSALON) 200 MG capsule   Other Relevant Orders   DG Chest 2 View   Mild intermittent reactive airway disease with acute exacerbation       Relevant Medications   predniSONE (DELTASONE) 20 MG tablet   benzonatate (TESSALON) 200 MG capsule   albuterol (VENTOLIN HFA) 108 (90 Base) MCG/ACT inhaler       Return Follow-up in 1 week with Dr. Gena Fray if not improving..  Advised that prednisone may increase his blood sugars.  Discussed strain of his cervical strap muscles associated with his difficulty of breathing.  Libby Maw, MD

## 2022-03-17 ENCOUNTER — Encounter: Payer: Self-pay | Admitting: Family Medicine

## 2022-03-17 ENCOUNTER — Ambulatory Visit (INDEPENDENT_AMBULATORY_CARE_PROVIDER_SITE_OTHER): Payer: BC Managed Care – PPO

## 2022-03-17 ENCOUNTER — Encounter: Payer: Self-pay | Admitting: Endocrinology

## 2022-03-17 ENCOUNTER — Other Ambulatory Visit: Payer: BC Managed Care – PPO

## 2022-03-17 DIAGNOSIS — M47814 Spondylosis without myelopathy or radiculopathy, thoracic region: Secondary | ICD-10-CM | POA: Diagnosis not present

## 2022-03-17 DIAGNOSIS — J22 Unspecified acute lower respiratory infection: Secondary | ICD-10-CM

## 2022-03-17 NOTE — Progress Notes (Unsigned)
Initital visit for lower respiratory infection. Pt reports a productive cough, wheezing and tightness in his chest.  Current tobacco smoker

## 2022-03-18 ENCOUNTER — Other Ambulatory Visit: Payer: BC Managed Care – PPO

## 2022-03-25 ENCOUNTER — Telehealth: Payer: Self-pay

## 2022-03-25 NOTE — Telephone Encounter (Signed)
-----   Message from Mliss Sax, MD sent at 03/23/2022  1:16 PM EST ----- Chest x-ray did show evidence for possible pneumonia.  Please follow-up in the clinic in 3 to 4 weeks for recheck and a repeat of the chest x-ray.

## 2022-03-25 NOTE — Telephone Encounter (Signed)
Pt viewed results on Mychart. LVM for pt to call the office to schedule follow up appt as requested by Dr. Doreene Burke in the result note.

## 2022-03-30 ENCOUNTER — Encounter: Payer: Self-pay | Admitting: Family Medicine

## 2022-03-30 ENCOUNTER — Ambulatory Visit: Payer: BC Managed Care – PPO | Admitting: Family Medicine

## 2022-03-30 VITALS — BP 108/68 | HR 76 | Temp 97.7°F | Ht 78.0 in | Wt 220.0 lb

## 2022-03-30 DIAGNOSIS — J4521 Mild intermittent asthma with (acute) exacerbation: Secondary | ICD-10-CM

## 2022-03-30 DIAGNOSIS — J22 Unspecified acute lower respiratory infection: Secondary | ICD-10-CM

## 2022-03-30 MED ORDER — PREDNISONE 10 MG PO TABS: 10.0000 mg | ORAL_TABLET | Freq: Every day | ORAL | 0 refills | Status: AC

## 2022-03-30 MED ORDER — BENZONATATE 200 MG PO CAPS: 200.0000 mg | ORAL_CAPSULE | Freq: Two times a day (BID) | ORAL | 0 refills | Status: DC | PRN

## 2022-03-30 MED ORDER — AMOXICILLIN-POT CLAVULANATE 875-125 MG PO TABS: 1.0000 | ORAL_TABLET | Freq: Two times a day (BID) | ORAL | 0 refills | Status: DC

## 2022-03-30 NOTE — Progress Notes (Signed)
Established Patient Office Visit  Subjective   Patient ID: Alex Watson, male    DOB: 26-Mar-1966  Age: 56 y.o. MRN: [id number]  Chief Complaint  Patient presents with   Follow-up    Follow up on pneumonia per patient still coughing with congestion.     HPI follow-up of lower respiratory tract infection.  Seem to be doing better until just after he completed his course of doxycycline.  Cough continues to be productive.  There is some lingering tightness in the chest.  Prednisone did affect his glucose levels.  Has any further fevers.  He is feeling well otherwise.    Review of Systems  Constitutional: Negative.   HENT: Negative.    Eyes:  Negative for blurred vision, discharge and redness.  Respiratory:  Positive for cough and sputum production.   Cardiovascular: Negative.   Gastrointestinal:  Negative for abdominal pain.  Genitourinary: Negative.   Musculoskeletal: Negative.  Negative for myalgias.  Skin:  Negative for rash.  Neurological:  Negative for tingling, loss of consciousness and weakness.  Endo/Heme/Allergies:  Negative for polydipsia.      Objective:     BP 108/68 (BP Location: Left Arm, Patient Position: Sitting, Cuff Size: Normal)   Pulse 76   Temp 97.7 F (36.5 C) (Temporal)   Ht 6\' 6"  (1.981 m)   Wt 220 lb (99.8 kg)   SpO2 98%   BMI 25.42 kg/m    Physical Exam Constitutional:      General: He is not in acute distress.    Appearance: Normal appearance. He is not ill-appearing, toxic-appearing or diaphoretic.  HENT:     Head: Normocephalic and atraumatic.     Right Ear: External ear normal.     Left Ear: External ear normal.     Mouth/Throat:     Mouth: Mucous membranes are moist.     Pharynx: Oropharynx is clear. No oropharyngeal exudate or posterior oropharyngeal erythema.  Eyes:     General: No scleral icterus.       Right eye: No discharge.        Left eye: No discharge.     Extraocular Movements: Extraocular movements intact.      Conjunctiva/sclera: Conjunctivae normal.     Pupils: Pupils are equal, round, and reactive to light.  Cardiovascular:     Rate and Rhythm: Normal rate and regular rhythm.  Pulmonary:     Effort: Pulmonary effort is normal. No respiratory distress.     Breath sounds: Normal breath sounds. No wheezing or rales.  Abdominal:     General: Bowel sounds are normal.     Tenderness: There is no abdominal tenderness. There is no guarding.  Musculoskeletal:     Cervical back: No rigidity or tenderness.  Skin:    General: Skin is warm and dry.  Neurological:     Mental Status: He is alert and oriented to person, place, and time.  Psychiatric:        Mood and Affect: Mood normal.        Behavior: Behavior normal.      No results found for any visits on 03/30/22.    The 10-year ASCVD risk score (Arnett DK, et al., 2019) is: 8.6%    Assessment & Plan:   Problem List Items Addressed This Visit   None Visit Diagnoses     Lower respiratory tract infection    -  Primary   Relevant Medications   amoxicillin-clavulanate (AUGMENTIN) 875-125 MG tablet   benzonatate (  TESSALON) 200 MG capsule   Mild intermittent reactive airway disease with acute exacerbation       Relevant Medications   predniSONE (DELTASONE) 10 MG tablet       Return in about 2 weeks (around 04/13/2022), or Follow-up with Dr. Veto Kemps in a few weeks for recheck and repeat chest x-ray.Mliss Sax, MD

## 2022-04-13 ENCOUNTER — Encounter: Payer: Self-pay | Admitting: Family Medicine

## 2022-04-13 ENCOUNTER — Ambulatory Visit: Payer: BC Managed Care – PPO | Admitting: Family Medicine

## 2022-04-13 VITALS — BP 118/70 | HR 81 | Temp 98.8°F | Ht 78.0 in | Wt 221.8 lb

## 2022-04-13 DIAGNOSIS — J22 Unspecified acute lower respiratory infection: Secondary | ICD-10-CM | POA: Diagnosis not present

## 2022-04-13 MED ORDER — HYDROCODONE BIT-HOMATROP MBR 5-1.5 MG/5ML PO SOLN: 5.0000 mL | Freq: Three times a day (TID) | ORAL | 0 refills | Status: DC | PRN

## 2022-04-13 NOTE — Progress Notes (Signed)
[facility] [address] [place] [address] Dept: [phone] Dept Fax: [phone]  Office Visit  Subjective:    Patient ID: Alex Watson, male    DOB: 1965/06/27, 56 y.o..   MRN: [id number]  Chief Complaint  Patient presents with   Follow-up    2 week f/u.  Still having some cough bet better.     History of Present Illness:  Patient is in today for reassessment of his recent respiratory illness. Alex Watson was seen by Dr. Doreene Burke on 11/6 with a 3-week history of cough. He smokes ~ 1 ppd of cigarettes. He had a chest x-ray that did show some infiltrate. He was treated with a course of doxycycline, benzonatate, and prednisone. Dr. Doreene Burke saw him in follow-up on 11/20. He was still have significant cough. He was switched to a course of Augmentin. Alex Watson notes his cough is improving some at this point, though it has not resolved.  Past Medical History: Patient Active Problem List   Diagnosis Date Noted   Tobacco use 04/23/2021   Hyperlipidemia 04/23/2021   Warts 04/23/2021   Osteoarthritis of spine with radiculopathy, cervical region 11/15/2015   Subarachnoid bleed (HCC) 01/04/2015   Cerebral contusion and laceration with loss of consciousness (HCC) 01/04/2015   Type 2 diabetes mellitus without complication (HCC) 08/18/2014   Past Surgical History:  Procedure Laterality Date   MANDIBLE SURGERY  1987   Family History  Problem Relation Age of Onset   Asthma Mother    Arthritis Mother    COPD Father    Diabetes Father    Heart disease Sister        eisenberger syndrome   Lupus Sister    Outpatient Medications Prior to Visit  Medication Sig Dispense Refill   albuterol (VENTOLIN HFA) 108 (90 Base) MCG/ACT inhaler Inhale 1-2 puffs into the lungs every 6 (six) hours as needed for wheezing or shortness of breath. 8 g 2   atorvastatin (LIPITOR) 40 MG tablet Take 1 tablet (40 mg total) by mouth daily. 90 tablet 3    Continuous Blood Gluc Sensor (DEXCOM G7 SENSOR) MISC Change every 10 days 9 each 2   Continuous Blood Gluc Transmit (DEXCOM G6 TRANSMITTER) MISC Replace every 3 months 1 each 3   insulin lispro (HUMALOG) 100 UNIT/ML injection Inject 0.28 mLs (28 Units total) into the skin daily with supper. 30 mL 3   Insulin Syringe-Needle U-100 (B-D INS SYR ULTRAFINE 1CC/30G) 30G X 1/2" 1 ML MISC USE 1 SYRINGE/NEEDLE DAILY 30 each 0   SEMGLEE, YFGN, 100 UNIT/ML Pen INJECT 45 UNITS INTO THE SKIN EVERY MORNING 45 mL 1   traZODone (DESYREL) 50 MG tablet Take 0.5-1 tablets (25-50 mg total) by mouth at bedtime as needed for sleep. 30 tablet 3   amoxicillin-clavulanate (AUGMENTIN) 875-125 MG tablet Take 1 tablet by mouth 2 (two) times daily. 20 tablet 0   benzonatate (TESSALON) 200 MG capsule Take 1 capsule (200 mg total) by mouth 2 (two) times daily as needed for cough. 20 capsule 0   No facility-administered medications prior to visit.   Allergies  Allergen Reactions   Morphine     REACTION: difficulty breathing     Objective:   Today's Vitals   04/13/22 1543  BP: 118/70  Pulse: 81  Temp: 98.8 F (37.1 C)  TempSrc: Temporal  SpO2: 94%  Weight: 221 lb 12.8 oz (100.6 kg)  Height: 6\' 6"  (1.981 m)   Body mass index  is 25.63 kg/m.   General: Well developed, well nourished. No acute distress. Lungs: Clear to auscultation bilaterally. No wheezing, rales or rhonchi. Psych: Alert and oriented. Normal mood and affect.  Health Maintenance Due  Topic Date Due   HIV Screening  Never done   Hepatitis C Screening  Never done   Lung Cancer Screening  09/22/2017   COLONOSCOPY (Pts 45-51yrs Insurance coverage will need to be confirmed)  05/14/2019   Zoster Vaccines- Shingrix (2 of 2) 06/18/2021   FOOT EXAM  10/31/2021   INFLUENZA VACCINE  12/09/2021   COVID-19 Vaccine (4 - 2023-24 season) 01/09/2022   Imaging: CXR  (03/17/2022) IMPRESSION: Patchy consolidation left lower hemithorax may represent infection  in the appropriate clinical setting. Followup PA and lateral chest X-ray is recommended in 3-4 weeks following trial of antibiotic therapy to ensure resolution and exclude underlying malignancy.  Assessment & Plan:   1. Lower respiratory tract infection Lungs are clear at this point, though Alex Watson is still having some coughing spells. I will send him for a repeat CXR. I do not believe he needs further antibiotics at this point. I will renew some cough medicine. Follow-up will be based on x-ray results.  - DG Chest 2 View; Future - HYDROcodone bit-homatropine (HYCODAN) 5-1.5 MG/5ML syrup; Take 5 mLs by mouth every 8 (eight) hours as needed for cough.  Dispense: 120 mL; Refill: 0   Return in about 6 months (around 10/13/2022) for Reassessment.   Loyola Mast, MD

## 2022-05-04 ENCOUNTER — Other Ambulatory Visit: Payer: Self-pay | Admitting: Family Medicine

## 2022-05-04 DIAGNOSIS — E782 Mixed hyperlipidemia: Secondary | ICD-10-CM

## 2022-05-04 DIAGNOSIS — F5101 Primary insomnia: Secondary | ICD-10-CM

## 2022-05-25 ENCOUNTER — Encounter: Payer: Self-pay | Admitting: Family Medicine

## 2022-06-26 ENCOUNTER — Ambulatory Visit: Payer: BC Managed Care – PPO | Admitting: Endocrinology

## 2022-06-26 ENCOUNTER — Encounter: Payer: Self-pay | Admitting: Endocrinology

## 2022-06-26 VITALS — BP 128/84 | HR 78 | Ht 78.0 in | Wt 230.2 lb

## 2022-06-26 DIAGNOSIS — E119 Type 2 diabetes mellitus without complications: Secondary | ICD-10-CM | POA: Diagnosis not present

## 2022-06-26 LAB — POCT GLYCOSYLATED HEMOGLOBIN (HGB A1C): Hemoglobin A1C: 8.2 % — AB (ref 4.0–5.6)

## 2022-06-26 MED ORDER — INSULIN LISPRO (1 UNIT DIAL) 100 UNIT/ML (KWIKPEN): PEN_INJECTOR | SUBCUTANEOUS | 11 refills | Status: DC

## 2022-06-26 MED ORDER — TRESIBA FLEXTOUCH 200 UNIT/ML ~~LOC~~ SOPN: 50.0000 [IU] | PEN_INJECTOR | Freq: Every day | SUBCUTANEOUS | 1 refills | Status: DC

## 2022-06-26 NOTE — Progress Notes (Signed)
Patient ID: Alex Watson, male   DOB: 1965-05-20, 57 y.o.   MRN: [id number]           Reason for Appointment: Type I Diabetes follow-up   History of Present Illness   Diagnosis date: 2016  Previous history:  Insulin was started about 3 months after his diagnosis since oral agents failed Appears to be mostly on some form of glargine insulin along with mealtime insulin  A1c range in the last few years is: 8.2-9.8  Recent history:     Insulin regimen:    Semglee 25 units twice a day Humalog 20-25 at supper    Side effects from medications: None  Current self management, blood sugar patterns and problems identified:  A1c is the same at 8.2 compared to 8 He has still not used [place] as recommended and he continue to refill his Semglee Also did not go up on the evening dose as recommended on the [facility] However he has cut down mealtime coverage by about 5 units especially at dinnertime which has reduced hypoglycemia significantly He says his blood sugars may be more difficult to control when he is traveling At 8% although his GMI recently 6.7 He is sometimes late in taking his insulin at mealtimes causing variable postprandial readings which are periodically significantly high and occasionally followed by relatively low sugars He thinks that because of his job he does not want to use the pump Does not exercise because of his job  Exercise: None Diet management: Mostly not eating breakfast or lunch and usually eating dinner around 7 PM   Blood sugar pattern interpretation from his 2-week  Dexcom 7 download through 2/16  Blood sugars are generally higher overnight and the highest of the day but with moderate variability  POSTPRANDIAL readings after breakfast early morning are relatively even compared to Premeal readings and also similar patterns at lunch and dinner Blood sugars after lunch appear to be more variable than the second week However at dinnertime occasionally blood  sugars are significantly higher  Hypoglycemia has been minimal and only occasionally midday, evenings or around midnight usually preceded by significantly high readings Average blood sugars are generally close to 180 at all times but in the low 200 range overnight through 5 AM  CGM use % of time   2-week average/GV 176 SD 67  Time in range 54% was 67  % Time Above 180 28  Over 250 15  % Time Below 70 2     Previously   CGM use % of time 100  2-week average/GV 143/52  Time in range     67   %  % Time Above 180 13  % Time above 250 10  % Time Below 70 7+3     Dietician visit: Most recent:   None  Weight control:  Wt Readings from Last 3 Encounters:  06/26/22 230 lb 3.2 oz (104.4 kg)  04/13/22 221 lb 12.8 oz (100.6 kg)  03/30/22 220 lb (99.8 kg)            Diabetes labs:  Lab Results  Component Value Date   HGBA1C 8.2 (A) 06/26/2022   HGBA1C 8.0 (A) 03/09/2022   HGBA1C 9.0 (A) 11/17/2021   Lab Results  Component Value Date   MICROALBUR <0.7 11/17/2021   LDLCALC 68 11/17/2021   CREATININE 0.83 11/17/2021     Allergies as of 06/26/2022       Reactions   Morphine    REACTION: difficulty breathing  Medication List        Accurate as of June 26, 2022  4:03 PM. If you have any questions, ask your nurse or doctor.          albuterol 108 (90 Base) MCG/ACT inhaler Commonly known as: VENTOLIN HFA Inhale 1-2 puffs into the lungs every 6 (six) hours as needed for wheezing or shortness of breath.   atorvastatin 40 MG tablet Commonly known as: LIPITOR TAKE 1 TABLET(40 MG) BY MOUTH DAILY   Dexcom G6 Transmitter Misc Replace every 3 months   Dexcom G7 Sensor Misc Change every 10 days   HumaLOG 100 UNIT/ML injection Generic drug: insulin lispro Inject 0.28 mLs (28 Units total) into the skin daily with supper. What changed: Another medication with the same name was added. Make sure you understand how and when to take each. Changed by: Elayne Snare, MD   insulin lispro 100 UNIT/ML KwikPen Commonly known as: HumaLOG KwikPen Inject 0.28 mLs (28 Units total) into the skin daily with supper. What changed: You were already taking a medication with the same name, and this prescription was added. Make sure you understand how and when to take each. Changed by: Elayne Snare, MD   HYDROcodone bit-homatropine 5-1.5 MG/5ML syrup Commonly known as: HYCODAN Take 5 mLs by mouth every 8 (eight) hours as needed for cough.   Insulin Syringe-Needle U-100 30G X 1/2" 1 ML Misc Commonly known as: B-D INS SYR ULTRAFINE 1CC/30G USE 1 SYRINGE/NEEDLE DAILY   Semglee (yfgn) 100 UNIT/ML Pen Generic drug: insulin glargine-yfgn INJECT 45 UNITS INTO THE SKIN EVERY MORNING   traZODone 50 MG tablet Commonly known as: DESYREL TAKE 1/2 TO 1 TABLET(25 TO 50 MG) BY MOUTH AT BEDTIME AS NEEDED FOR SLEEP        Allergies:  Allergies  Allergen Reactions   Morphine     REACTION: difficulty breathing    Past Medical History:  Diagnosis Date   Allergy    Diabetes mellitus without complication ([employer])    Uncontrolled daytime somnolence 03/07/2013    Past Surgical History:  Procedure Laterality Date   MANDIBLE SURGERY  1987    Family History  Problem Relation Age of Onset   Asthma Mother    Arthritis Mother    COPD Father    Diabetes Father    Heart disease Sister        eisenberger syndrome   Lupus Sister     Social History:  reports that he has been smoking cigarettes. He has a 24.00 pack-year smoking history. He has never used smokeless tobacco. He reports current alcohol use. He reports that he does not use drugs.  Review of Systems:  Last diabetic eye exam date: 12/22, reportedly no retinopathy  Last foot exam date: 10/2020  Hypertension:   Not on treatment, ACE inhibitor or ARB  BP Readings from Last 3 Encounters:  06/26/22 128/84  04/13/22 118/70  03/30/22 108/68    Lipid management: Taking Lipitor 40 mg from his PCP since  12/22  Baseline LDL usually around 130 Last LDL was therapeutic    Lab Results  Component Value Date   CHOL 133 11/17/2021   CHOL 195 04/23/2021   CHOL 122 12/27/2019   Lab Results  Component Value Date   HDL 49.80 11/17/2021   HDL 49.00 04/23/2021   HDL 42.60 12/27/2019   Lab Results  Component Value Date   LDLCALC 68 11/17/2021   LDLCALC 128 (H) 04/23/2021   LDLCALC 70 12/27/2019   Lab  Results  Component Value Date   TRIG 75.0 11/17/2021   TRIG 94.0 04/23/2021   TRIG 46.0 12/27/2019   Lab Results  Component Value Date   CHOLHDL 3 11/17/2021   CHOLHDL 4 04/23/2021   CHOLHDL 3 12/27/2019   No results found for: "LDLDIRECT"   Examination:   BP 128/84 (BP Location: Left Arm, Patient Position: Sitting, Cuff Size: Normal)   Pulse 78   Ht 6' 6"$  (1.981 m)   Wt 230 lb 3.2 oz (104.4 kg)   SpO2 95%   BMI 26.60 kg/m   Body mass index is 26.6 kg/m.    ASSESSMENT/ PLAN:    Diabetes type 1 on basal bolus insulin:   Current regimen: Once a day basal insulin with glargine at night and mealtime Humalog taken postprandially  See history of present illness for detailed discussion of current diabetes management, blood sugar patterns and problems identified  A1c is 8.2 %  Blood glucose control is still inadequate with only 54% time in range on his Dexcom  He is using twice a day glargine insulin but still blood sugars are generally much higher overnight He is not adjusting his mealtime dose based on what he is eating and sometimes taking late injections Also discussed that his correction factor of arbitrarily taking 10 units for significantly high readings is excessive   INSULIN changes: Switch insulin at mealtimes to an insulin pen instead of syringe  TRESIBA U-200 insulin pen to be started with 50 units in the morning but may consider splitting to twice a day if patterns indicate Given titration sheet to adjust by 2 units every 3 days to keep morning sugars between  about 100 and about 140    Patient Instructions  Take Tresiba 50 units in am and adjust to keep am sugar 100-140  Humalog estimate 1 units brings sugar down 30 mg sugar   Alex Watson 06/26/2022, 4:03 PM

## 2022-06-26 NOTE — Patient Instructions (Addendum)
Take Tresiba 50 units in am and adjust to keep am sugar 100-140  Humalog estimate 1 units brings sugar down 30 mg sugar

## 2022-07-10 ENCOUNTER — Encounter: Payer: Self-pay | Admitting: Endocrinology

## 2022-08-28 ENCOUNTER — Ambulatory Visit: Payer: BC Managed Care – PPO | Admitting: Family Medicine

## 2022-08-28 ENCOUNTER — Encounter: Payer: Self-pay | Admitting: Family Medicine

## 2022-08-28 VITALS — BP 124/78 | HR 71 | Temp 98.7°F | Wt 227.6 lb

## 2022-08-28 DIAGNOSIS — Z720 Tobacco use: Secondary | ICD-10-CM | POA: Diagnosis not present

## 2022-08-28 DIAGNOSIS — R053 Chronic cough: Secondary | ICD-10-CM | POA: Diagnosis not present

## 2022-08-28 MED ORDER — BUPROPION HCL ER (SR) 150 MG PO TB12
ORAL_TABLET | ORAL | 0 refills | Status: DC
Start: 2022-08-28 — End: 2022-12-04

## 2022-08-28 MED ORDER — BUPROPION HCL ER (SR) 150 MG PO TB12
150.0000 mg | ORAL_TABLET | Freq: Two times a day (BID) | ORAL | 4 refills | Status: DC
Start: 2022-08-28 — End: 2022-12-04

## 2022-08-28 NOTE — Assessment & Plan Note (Signed)
Strongly encouraged smoking cessation. I will start him on bupropion. We also discussed use of nicotine gum for cravinngs.  I spent 6 minutes counseling the patient about tobacco cessation.

## 2022-08-28 NOTE — Assessment & Plan Note (Signed)
Alex Watson still needs a repeat CXR to follow-up from the previously abnormal exam. He will be in town for 2 weeks, so I will plan a CXR for next week. If the abnormality persists, I will move to a chest CT. He may ultimately need to see pulmonology +/- ENT to further assess.

## 2022-08-28 NOTE — Progress Notes (Signed)
[facility] [address] [place] [address] Dept: [phone] Dept Fax: [phone]  Office Visit  Subjective:    Patient ID: Alex Watson, male    DOB: 1965-10-06, 57 y.o..   MRN: [id number]  Chief Complaint  Patient presents with   Cough    C/o still having a cough x 1 month.  No OTC meds.    History of Present Illness:  Patient is in today for reassessment of his cough. Alex Watson notes the cough has been chronic since he was last seen in December. He and had an x-ray that showed a patchy infiltrate and was treated with antibiotics in Nov. Although he had a follow-up chest x-ray ordered, he never actually had this done. He works out of town and had trouble coordinating this. He continues to cough. At times, he feels like he gets pain in his larynx area.  Alex Watson smokes about 1 ppd of cigarettes. He had previously tried using Chantix. In his first attempt, he felt this was helping him reduce smoking, but after a few months, he had an insurance change and could not continue to access the medicine. He was started back on this at a later date, and then it wasn't as effective. He has tried to cut back, but admits he can't seem to do this on his own.  Past Medical History: Patient Active Problem List   Diagnosis Date Noted   Tobacco use 04/23/2021   Hyperlipidemia 04/23/2021   Warts 04/23/2021   Osteoarthritis of spine with radiculopathy, cervical region 11/15/2015   Subarachnoid bleed 01/04/2015   Cerebral contusion and laceration with loss of consciousness 01/04/2015   Type 2 diabetes mellitus without complication 08/18/2014   Past Surgical History:  Procedure Laterality Date   MANDIBLE SURGERY  1987   Family History  Problem Relation Age of Onset   Asthma Mother    Arthritis Mother    COPD Father    Diabetes Father    Heart disease Sister        eisenberger syndrome   Lupus Sister    Outpatient Medications  Prior to Visit  Medication Sig Dispense Refill   albuterol (VENTOLIN HFA) 108 (90 Base) MCG/ACT inhaler Inhale 1-2 puffs into the lungs every 6 (six) hours as needed for wheezing or shortness of breath. 8 g 2   atorvastatin (LIPITOR) 40 MG tablet TAKE 1 TABLET(40 MG) BY MOUTH DAILY 90 tablet 3   Continuous Blood Gluc Sensor (DEXCOM G7 SENSOR) MISC Change every 10 days 9 each 2   Continuous Blood Gluc Transmit (DEXCOM G6 TRANSMITTER) MISC Replace every 3 months 1 each 3   insulin degludec (TRESIBA FLEXTOUCH) 200 UNIT/ML FlexTouch Pen Inject 50 Units into the skin daily. 9 mL 1   insulin lispro (HUMALOG KWIKPEN) 100 UNIT/ML KwikPen Inject 0.28 mLs (28 Units total) into the skin daily with supper. 15 mL 11   Insulin Syringe-Needle U-100 (B-D INS SYR ULTRAFINE 1CC/30G) 30G X 1/2" 1 ML MISC USE 1 SYRINGE/NEEDLE DAILY 30 each 0   traZODone (DESYREL) 50 MG tablet TAKE 1/2 TO 1 TABLET(25 TO 50 MG) BY MOUTH AT BEDTIME AS NEEDED FOR SLEEP 30 tablet 3   HYDROcodone bit-homatropine (HYCODAN) 5-1.5 MG/5ML syrup Take 5 mLs by mouth every 8 (eight) hours as needed for cough. 120 mL 0   No facility-administered medications prior to visit.   Allergies  Allergen Reactions   Morphine     REACTION: difficulty breathing  Objective:   Today's Vitals   08/28/22 1514  BP: 124/78  Pulse: 71  Temp: 98.7 F (37.1 C)  TempSrc: Temporal  SpO2: 98%  Weight: 227 lb 9.6 oz (103.2 kg)   Body mass index is 26.3 kg/m.   General: Well developed, well nourished. No acute distress. Lungs: Clear to auscultation bilaterally. No wheezing, rales or rhonchi. Breath sound mildly distant. Psych: Alert and oriented. Normal mood and affect.  Health Maintenance Due  Topic Date Due   HIV Screening  Never done   Hepatitis C Screening  Never done   Lung Cancer Screening  09/22/2017   COLONOSCOPY (Pts 45-24yrs Insurance coverage will need to be confirmed)  05/14/2019   Zoster Vaccines- Shingrix (2 of 2) 06/18/2021    FOOT EXAM  10/31/2021   OPHTHALMOLOGY EXAM  04/29/2022     Assessment & Plan:   Problem List Items Addressed This Visit       Other   Tobacco use   Relevant Medications   buPROPion (WELLBUTRIN SR) 150 MG 12 hr tablet   buPROPion (WELLBUTRIN SR) 150 MG 12 hr tablet   Chronic cough - Primary    Alex Watson still needs a repeat CXR to follow-up from the previously abnormal exam. He will be in town for 2 weeks, so I will plan a CXR for next week. If the abnormality persists, I will move to a chest CT. He may ultimately need to see pulmonology +/- ENT to further assess.      Relevant Orders   DG Chest 2 View    Return for Follow-up after testing/imaging.   Loyola Mast, MD

## 2022-09-01 ENCOUNTER — Ambulatory Visit (INDEPENDENT_AMBULATORY_CARE_PROVIDER_SITE_OTHER)
Admission: RE | Admit: 2022-09-01 | Discharge: 2022-09-01 | Disposition: A | Discharge status: Home / Self Care | Payer: BC Managed Care – PPO | Source: Ambulatory Visit | Attending: Family Medicine | Admitting: Family Medicine

## 2022-09-01 DIAGNOSIS — R053 Chronic cough: Secondary | ICD-10-CM | POA: Diagnosis not present

## 2022-09-01 DIAGNOSIS — R059 Cough, unspecified: Secondary | ICD-10-CM | POA: Diagnosis not present

## 2022-09-03 ENCOUNTER — Encounter: Payer: Self-pay | Admitting: Family Medicine

## 2022-09-03 NOTE — Addendum Note (Signed)
Addended by: Loyola Mast on: 09/03/2022 10:18 AM   Modules accepted: Orders

## 2022-09-04 ENCOUNTER — Other Ambulatory Visit (INDEPENDENT_AMBULATORY_CARE_PROVIDER_SITE_OTHER): Payer: BC Managed Care – PPO

## 2022-09-04 DIAGNOSIS — E1065 Type 1 diabetes mellitus with hyperglycemia: Secondary | ICD-10-CM

## 2022-09-04 LAB — GLUCOSE, RANDOM: Glucose, Bld: 162 mg/dL — ABNORMAL HIGH (ref 70–99)

## 2022-09-04 LAB — HEMOGLOBIN A1C: Hgb A1c MFr Bld: 8 % — ABNORMAL HIGH (ref 4.6–6.5)

## 2022-09-08 ENCOUNTER — Other Ambulatory Visit: Payer: BC Managed Care – PPO

## 2022-09-11 ENCOUNTER — Encounter: Payer: Self-pay | Admitting: Endocrinology

## 2022-09-11 ENCOUNTER — Ambulatory Visit: Payer: BC Managed Care – PPO | Admitting: Endocrinology

## 2022-09-11 VITALS — BP 108/62 | HR 78 | Ht 78.0 in | Wt 229.0 lb

## 2022-09-11 DIAGNOSIS — E1065 Type 1 diabetes mellitus with hyperglycemia: Secondary | ICD-10-CM

## 2022-09-11 DIAGNOSIS — E109 Type 1 diabetes mellitus without complications: Secondary | ICD-10-CM

## 2022-09-11 MED ORDER — TRESIBA FLEXTOUCH 200 UNIT/ML ~~LOC~~ SOPN
50.0000 [IU] | PEN_INJECTOR | Freq: Every day | SUBCUTANEOUS | 1 refills | Status: DC
Start: 2022-09-11 — End: 2022-12-03

## 2022-09-11 MED ORDER — LYUMJEV 100 UNIT/ML IJ SOLN
28.0000 [IU] | Freq: Every day | INTRAMUSCULAR | 2 refills | Status: DC
Start: 2022-09-11 — End: 2022-11-20

## 2022-09-11 NOTE — Progress Notes (Signed)
Medication Samples have been provided to the patient.  Drug name: Lyumjev       Strength: 100mg /mL         Qty: 1 pen   LOT: [id number]  Exp.Date: 01/17/23  Dosing instructions: Inject 28U SQ daily with supper.  The patient has been instructed regarding the correct time, dose, and frequency of taking this medication, including desired effects and most common side effects.

## 2022-09-11 NOTE — Progress Notes (Signed)
Patient ID: Alex Watson, male   DOB: 1966-01-09, 57 y.o.   MRN: [id number]           Reason for Appointment: Type I Diabetes follow-up   History of Present Illness   Diagnosis date: 2016  Previous history:  Insulin was started about 3 months after his diagnosis since oral agents failed Appears to be mostly on some form of glargine insulin along with mealtime insulin  A1c range in the last few years is: 8.2-9.8  Recent history:     Insulin regimen: Tresiba 50 units daily    Humalog 20-25 at supper    Side effects from medications: None  Current self management, blood sugar patterns and problems identified:  A1c is 8 He has generally been eating only 1 meal a day in the evening which can be at variable times Has finally started [place] that has been recommended to replace his glargine insulin Although he feels that the sugars are more stable with this he still has very labile blood sugars at all times including overnight Has not changed his [place] dosage since this was started Overall his blood sugars are quite variable postprandially in the evenings with higher readings in the first week of the data compared to the second week, possibly from eating out in the evenings in the first week He says that his blood sugars go up in a delayed manner after his meals However blood sugars are generally near normal or low at bedtime and start rising mostly late at night after midnight with HIGHEST blood sugars around 3-5 AM Also sporadically he will have hypoglycemia overnight and also around midnight Although he is trying to use glucose tablets for low sugars he thinks that sometimes he will overtreat low sugars As before his A1c is higher than expected for his blood sugar average with GMI 6.8  Exercise: None  Diet management: Usually not eating breakfast or lunch and usually eating dinner around 7 PM.  Eating periodically   Blood sugar pattern interpretation from his 2-week  Dexcom 7  download through 2/16  Overnight blood sugars are highly variable but generally tending to be rising after midnight to as much as 400 with highest readings between 3-5 AM with AVERAGE 209 at about 5 AM Also has periodic hypoglycemia during the night before or after midnight Blood sugars are also variable during the day with some rebound from low normal readings around 9 AM POSTPRANDIAL readings after dinner are significantly higher in the first week and generally within the target range in the second week Blood sugars around 9-11 PM may tend to get low after her previous rise Overall hypoglycemia is most significant between 11 PM and 4 AM and periodically mild hypoglycemia midday Time in range better than the last time   CGM use % of time   2-week average/GV 147/45  Time in range        65%  % Time Above 180 21  % Time above 250 7  % Time Below 70 5+2     Previously: CGM use % of time   2-week average/GV 176 SD 67  Time in range 54% was 67  % Time Above 180 28  Over 250 15  % Time Below 70 2    Dietician visit: Most recent:   None  Weight control:  Wt Readings from Last 3 Encounters:  09/11/22 229 lb (103.9 kg)  08/28/22 227 lb 9.6 oz (103.2 kg)  06/26/22 230 lb 3.2 oz (104.4  kg)            Diabetes labs:  Lab Results  Component Value Date   HGBA1C 8.0 (H) 09/04/2022   HGBA1C 8.2 (A) 06/26/2022   HGBA1C 8.0 (A) 03/09/2022   Lab Results  Component Value Date   MICROALBUR <0.7 11/17/2021   LDLCALC 68 11/17/2021   CREATININE 0.83 11/17/2021     Allergies as of 09/11/2022       Reactions   Morphine    REACTION: difficulty breathing        Medication List        Accurate as of Sep 11, 2022  8:59 PM. If you have any questions, ask your nurse or doctor.          STOP taking these medications    insulin lispro 100 UNIT/ML KwikPen Commonly known as: HumaLOG KwikPen Stopped by: Reather Littler, MD       TAKE these medications    albuterol 108 (90  Base) MCG/ACT inhaler Commonly known as: VENTOLIN HFA Inhale 1-2 puffs into the lungs every 6 (six) hours as needed for wheezing or shortness of breath.   atorvastatin 40 MG tablet Commonly known as: LIPITOR TAKE 1 TABLET(40 MG) BY MOUTH DAILY   buPROPion 150 MG 12 hr tablet Commonly known as: Wellbutrin SR Take 1 tablet (150 mg total) by mouth daily for 3 days, THEN 1 tablet (150 mg total) 2 (two) times daily for 27 days. Start taking on: August 28, 2022   buPROPion 150 MG 12 hr tablet Commonly known as: Wellbutrin SR Take 1 tablet (150 mg total) by mouth 2 (two) times daily.   Dexcom G6 Transmitter Misc Replace every 3 months   Dexcom G7 Sensor Misc Change every 10 days   Insulin Syringe-Needle U-100 30G X 1/2" 1 ML Misc Commonly known as: B-D INS SYR ULTRAFINE 1CC/30G USE 1 SYRINGE/NEEDLE DAILY   Lyumjev 100 UNIT/ML Soln Generic drug: Insulin Lispro-aabc Inject 28 Units as directed daily with supper. Started by: Reather Littler, MD   traZODone 50 MG tablet Commonly known as: DESYREL TAKE 1/2 TO 1 TABLET(25 TO 50 MG) BY MOUTH AT BEDTIME AS NEEDED FOR SLEEP   Tresiba FlexTouch 200 UNIT/ML FlexTouch Pen Generic drug: insulin degludec Inject 50 Units into the skin daily.        Allergies:  Allergies  Allergen Reactions   Morphine     REACTION: difficulty breathing    Past Medical History:  Diagnosis Date   Allergy    Diabetes mellitus without complication (HCC)    Uncontrolled daytime somnolence 03/07/2013    Past Surgical History:  Procedure Laterality Date   MANDIBLE SURGERY  1987    Family History  Problem Relation Age of Onset   Asthma Mother    Arthritis Mother    COPD Father    Diabetes Father    Heart disease Sister        eisenberger syndrome   Lupus Sister     Social History:  reports that he has been smoking cigarettes. He has a 24.00 pack-year smoking history. He has never used smokeless tobacco. He reports current alcohol use. He reports  that he does not use drugs.  Review of Systems:  Last diabetic eye exam date: 12/22, reportedly no retinopathy  Last foot exam date: 10/2020  Hypertension:   Not on treatment, ACE inhibitor or ARB  BP Readings from Last 3 Encounters:  09/11/22 108/62  08/28/22 124/78  06/26/22 128/84    Lipid management: Taking  Lipitor 40 mg from his PCP since 12/22  Baseline LDL usually around 130 Last LDL was therapeutic    Lab Results  Component Value Date   CHOL 133 11/17/2021   CHOL 195 04/23/2021   CHOL 122 12/27/2019   Lab Results  Component Value Date   HDL 49.80 11/17/2021   HDL 49.00 04/23/2021   HDL 42.60 12/27/2019   Lab Results  Component Value Date   LDLCALC 68 11/17/2021   LDLCALC 128 (H) 04/23/2021   LDLCALC 70 12/27/2019   Lab Results  Component Value Date   TRIG 75.0 11/17/2021   TRIG 94.0 04/23/2021   TRIG 46.0 12/27/2019   Lab Results  Component Value Date   CHOLHDL 3 11/17/2021   CHOLHDL 4 04/23/2021   CHOLHDL 3 12/27/2019   No results found for: "LDLDIRECT"   Examination:   BP 108/62   Pulse 78   Ht 6\' 6"  (1.981 m)   Wt 229 lb (103.9 kg)   SpO2 94%   BMI 26.46 kg/m   Body mass index is 26.46 kg/m.   Diabetic Foot Exam - Simple   Simple Foot Form Diabetic Foot exam was performed with the following findings: Yes   Visual Inspection No deformities, no ulcerations, no other skin breakdown bilaterally: Yes Sensation Testing Intact to touch and monofilament testing bilaterally: Yes Pulse Check Posterior Tibialis and Dorsalis pulse intact bilaterally: Yes Comments     ASSESSMENT/ PLAN:    Diabetes type 1 on basal bolus insulin:   Current regimen: Once a day basal insulin with TRESIBA at night and mealtime Humalog  postprandially  See history of present illness for detailed discussion of current diabetes management, blood sugar patterns and problems identified  A1c is 8 %  Blood glucose control is still very difficult with highly  variable blood sugars and 7% of his blood sugars below 70  Even with using Tresiba insulin overall blood sugars relatively slightly better but highly variable overnight Currently has 65 % time in range on his Dexcom download for the last 2 weeks with GMI 6.8  Unclear why he has taken his blood sugars overnight but may be related to low normal or low sugars around bedtime and rebound However blood sugars are as low as 108 average by 9 AM indicating adequate basal insulin and low normal readings during the day Based on his diet he is not on this adequately covering his evening meals but also tendency to low sugars 3 to 4 hours later No history suggestive of gastroparesis  INSULIN changes: Switch insulin to Lyumjev with a sample instead of Humalog Do not use more than 22 units Humalog for evening meal and may take more when eating out To always take the insulin before starting to eat Try reducing Tresiba to 46 units only Avoid any overtreatment of low blood sugars  Again consider using OmniPod pump although he feels like he would not this off while at work but discussed the benefits of closed-loop systems, brochure given and he will let us know if he is interested    Patient Instructions  Tresiba 46 and keep am sugar 100-140  Humalog 18-22 before supper   Total visit time for evaluation and management and counseling = 30 minutes  Shantil Vallejo 09/11/2022, 8:59 PM

## 2022-09-11 NOTE — Patient Instructions (Addendum)
Tresiba 46 and keep am sugar 100-140  Humalog 18-22 before supper

## 2022-09-15 ENCOUNTER — Encounter: Payer: Self-pay | Admitting: Endocrinology

## 2022-09-17 ENCOUNTER — Other Ambulatory Visit: Payer: Self-pay | Admitting: Family Medicine

## 2022-09-17 DIAGNOSIS — F5101 Primary insomnia: Secondary | ICD-10-CM

## 2022-09-17 NOTE — Telephone Encounter (Signed)
Refill request for  Trazodone 50 mg LR  05/05/22, #30, 3 rf LOV  08/28/22 FOV  none scheduled.   Please review and advise.  Thanks. Dm/cma

## 2022-09-25 ENCOUNTER — Institutional Professional Consult (permissible substitution): Payer: BC Managed Care – PPO | Admitting: Internal Medicine

## 2022-10-19 ENCOUNTER — Institutional Professional Consult (permissible substitution): Payer: BC Managed Care – PPO | Admitting: Internal Medicine

## 2022-10-22 ENCOUNTER — Other Ambulatory Visit: Payer: Self-pay | Admitting: Nurse Practitioner

## 2022-10-22 DIAGNOSIS — F5101 Primary insomnia: Secondary | ICD-10-CM

## 2022-11-04 ENCOUNTER — Other Ambulatory Visit: Payer: Self-pay | Admitting: Endocrinology

## 2022-11-04 DIAGNOSIS — E119 Type 2 diabetes mellitus without complications: Secondary | ICD-10-CM

## 2022-11-06 ENCOUNTER — Telehealth: Payer: Self-pay

## 2022-11-06 DIAGNOSIS — E109 Type 1 diabetes mellitus without complications: Secondary | ICD-10-CM

## 2022-11-06 MED ORDER — LYUMJEV KWIKPEN 100 UNIT/ML ~~LOC~~ SOPN
PEN_INJECTOR | SUBCUTANEOUS | 2 refills | Status: DC
Start: 2022-11-06 — End: 2023-02-22

## 2022-11-06 NOTE — Telephone Encounter (Signed)
Patient wife called and wanted to be switched from the vial to the pen. 10 ml. Placed a rx into

## 2022-11-16 ENCOUNTER — Other Ambulatory Visit (INDEPENDENT_AMBULATORY_CARE_PROVIDER_SITE_OTHER): Payer: BC Managed Care – PPO

## 2022-11-16 DIAGNOSIS — E1065 Type 1 diabetes mellitus with hyperglycemia: Secondary | ICD-10-CM

## 2022-11-16 LAB — BASIC METABOLIC PANEL
BUN: 10 mg/dL (ref 6–23)
CO2: 28 mEq/L (ref 19–32)
Calcium: 9.7 mg/dL (ref 8.4–10.5)
Chloride: 102 mEq/L (ref 96–112)
Creatinine, Ser: 0.92 mg/dL (ref 0.40–1.50)
GFR: 93.88 mL/min (ref >60.00)
Glucose, Bld: 70 mg/dL (ref 70–99)
Potassium: 4.6 mEq/L (ref 3.5–5.1)
Sodium: 139 mEq/L (ref 135–145)

## 2022-11-16 LAB — MICROALBUMIN / CREATININE URINE RATIO
Creatinine,U: 35 mg/dL
Microalb Creat Ratio: 2 mg/g (ref 0.0–30.0)
Microalb, Ur: <0.7 mg/dL (ref 0.0–1.9)

## 2022-11-16 LAB — HEMOGLOBIN A1C: Hgb A1c MFr Bld: 8.2 % — ABNORMAL HIGH (ref 4.6–6.5)

## 2022-11-20 ENCOUNTER — Ambulatory Visit: Payer: BC Managed Care – PPO | Admitting: "Endocrinology

## 2022-11-20 ENCOUNTER — Encounter: Payer: Self-pay | Admitting: "Endocrinology

## 2022-11-20 VITALS — BP 120/70 | HR 74 | Ht 78.0 in | Wt 236.0 lb

## 2022-11-20 DIAGNOSIS — E7800 Pure hypercholesterolemia, unspecified: Secondary | ICD-10-CM

## 2022-11-20 DIAGNOSIS — E1065 Type 1 diabetes mellitus with hyperglycemia: Secondary | ICD-10-CM | POA: Diagnosis not present

## 2022-11-20 DIAGNOSIS — Z794 Long term (current) use of insulin: Secondary | ICD-10-CM | POA: Diagnosis not present

## 2022-11-20 LAB — LIPID PANEL
Cholesterol: 105 mg/dL (ref 0–200)
HDL: 37.3 mg/dL — ABNORMAL LOW (ref >39.00)
LDL Cholesterol: 55 mg/dL (ref 0–99)
NonHDL: 67.96
Total CHOL/HDL Ratio: 3
Triglycerides: 64 mg/dL (ref 0.0–149.0)
VLDL: 12.8 mg/dL (ref 0.0–40.0)

## 2022-11-20 LAB — HEPATIC FUNCTION PANEL
ALT: 24 U/L (ref 0–53)
AST: 16 U/L (ref 0–37)
Albumin: 4.4 g/dL (ref 3.5–5.2)
Alkaline Phosphatase: 87 U/L (ref 39–117)
Bilirubin, Direct: 0.1 mg/dL (ref 0.0–0.3)
Total Bilirubin: 0.5 mg/dL (ref 0.2–1.2)
Total Protein: 6.8 g/dL (ref 6.0–8.3)

## 2022-11-20 NOTE — Progress Notes (Signed)
Outpatient Endocrinology Note Alex [place], MD  11/20/22   Alex Watson [birth year] [id number]  Referring Provider: Loyola Mast, MD Primary Care Provider: Loyola Mast, MD Reason for consultation: Subjective   Assessment & Plan  Usher was seen today for diabetes.  Diagnoses and all orders for this visit:  Uncontrolled type 1 diabetes mellitus with hyperglycemia (HCC) -     Lipid panel; Future -     Hepatic function panel -     Lipid panel  Long-term insulin use (HCC)  Pure hypercholesterolemia    Diabetes Type II complicated by hyperglycemia   Lab Results  Component Value Date   GFR 93.88 11/16/2022   Hba1c goal less than 7, current Hba1c is  Lab Results  Component Value Date   HGBA1C 8.2 (H) 11/16/2022   Tresiba 50 units daily    Lyumjev 30-32 units at supper 15 min before supper Instructed to eat 3 equi sized meals rather than 1 big and 2 minor meals   No known contraindications to any of above medications  -Last LD and Tg are as follows: Lab Results  Component Value Date   LDLCALC 68 11/17/2021    Lab Results  Component Value Date   TRIG 75.0 11/17/2021   -On atorvastatin 40 mg every day -ordered repeat lab  -Follow low fat diet and exercise   -Blood pressure goal <140/90 - Microalbumin/creatinine goal < 30 -Last MA/Cr is as follows: Lab Results  Component Value Date   MICROALBUR <0.7 11/16/2022   -not on ACE/ARB -diet changes including salt restriction -limit eating outside -counseled BP targets per standards of diabetes care -uncontrolled blood pressure can lead to retinopathy, nephropathy and cardiovascular and atherosclerotic heart disease  Reviewed and counseled on: -A1C target -Blood sugar targets -Complications of uncontrolled diabetes  -Checking blood sugar before meals and bedtime and bring log next visit -All medications with mechanism of action and side effects -Hypoglycemia management: rule of 15's, Glucagon  Emergency Kit and medical alert ID -low-carb low-fat plate-method diet -At least 20 minutes of physical activity per day -Annual dilated retinal eye exam and foot exam -compliance and follow up needs -follow up as scheduled or earlier if problem gets worse  Call if blood sugar is less than 70 or consistently above 250    Take a 15 gm snack of carbohydrate at bedtime before you go to sleep if your blood sugar is less than 100.    If you are going to fast after midnight for a test or procedure, ask your physician for instructions on how to reduce/decrease your insulin dose.    Call if blood sugar is less than 70 or consistently above 250  -Treating a low sugar by rule of 15  (15 gms of sugar every 15 min until sugar is more than 70) If you feel your sugar is low, test your sugar to be sure If your sugar is low (less than 70), then take 15 grams of a fast acting Carbohydrate (3-4 glucose tablets or glucose gel or 4 ounces of juice or regular soda) Recheck your sugar 15 min after treating low to make sure it is more than 70 If sugar is still less than 70, treat again with 15 grams of carbohydrate          Don't drive the hour of hypoglycemia  If unconscious/unable to eat or drink by mouth, use glucagon injection or nasal spray baqsimi and call 911. Can repeat again in 15 min if  still unconscious.  Return in about 3 months (around 02/25/2023) for visit, labs today.   I have reviewed current medications, nurse's notes, allergies, vital signs, past medical and surgical history, family medical history, and social history for this encounter. Counseled patient on symptoms, examination findings, lab findings, imaging results, treatment decisions and monitoring and prognosis. The patient understood the recommendations and agrees with the treatment plan. All questions regarding treatment plan were fully answered.  Alex [place], MD  11/20/22    History of Present Illness Alex Watson is a 57  y.o. year old male who presents for evaluation of Type II diabetes mellitus.  Alex Watson was first diagnosed in 2016.   Diabetes education +  Current regimen: Tresiba 50 units daily    Lyumjev 28 units at supper  Previous history:  Insulin was started about 3 months after his diagnosis since oral agents failed Appears to be mostly on some form of glargine insulin along with mealtime insulin  COMPLICATIONS -  MI/Stroke -  retinopathy -  neuropathy -  nephropathy  BLOOD SUGAR DATA  CGM interpretation: At today's visit, we reviewed her CGM downloads. The full report is scanned in the media. Reviewing the CGM trends, BG are elevated all day except 6am-12pm when it is well controlled.  Physical Exam  BP 120/70   Pulse 74   Ht 6\' 6"  (1.981 m)   Wt 236 lb (107 kg)   SpO2 96%   BMI 27.27 kg/m    Constitutional: well developed, well nourished Head: normocephalic, atraumatic Eyes: sclera anicteric, no redness Neck: supple Lungs: normal respiratory effort Neurology: alert and oriented Skin: dry, no appreciable rashes Musculoskeletal: no appreciable defects Psychiatric: normal mood and affect Diabetic Foot Exam - Simple   No data filed      Current Medications Patient's Medications  New Prescriptions   No medications on file  Previous Medications   ALBUTEROL (VENTOLIN HFA) 108 (90 BASE) MCG/ACT INHALER    Inhale 1-2 puffs into the lungs every 6 (six) hours as needed for wheezing or shortness of breath.   ATORVASTATIN (LIPITOR) 40 MG TABLET    TAKE 1 TABLET(40 MG) BY MOUTH DAILY   BUPROPION (WELLBUTRIN SR) 150 MG 12 HR TABLET    Take 1 tablet (150 mg total) by mouth daily for 3 days, THEN 1 tablet (150 mg total) 2 (two) times daily for 27 days.   BUPROPION (WELLBUTRIN SR) 150 MG 12 HR TABLET    Take 1 tablet (150 mg total) by mouth 2 (two) times daily.   CONTINUOUS BLOOD GLUC TRANSMIT (DEXCOM G6 TRANSMITTER) MISC    Replace every 3 months   CONTINUOUS GLUCOSE SENSOR  (DEXCOM G7 SENSOR) MISC    CHANGE EVERY 10 DAYS   INSULIN DEGLUDEC (TRESIBA FLEXTOUCH) 200 UNIT/ML FLEXTOUCH PEN    Inject 50 Units into the skin daily.   INSULIN LISPRO-AABC (LYUMJEV KWIKPEN) 100 UNIT/ML KWIKPEN    Inject 28 units daily   INSULIN SYRINGE-NEEDLE U-100 (B-D INS SYR ULTRAFINE 1CC/30G) 30G X 1/2" 1 ML MISC    USE 1 SYRINGE/NEEDLE DAILY   TRAZODONE (DESYREL) 50 MG TABLET    TAKE 1/2 TO 1 TABLET(25 TO 50 MG) BY MOUTH AT BEDTIME AS NEEDED FOR SLEEP  Modified Medications   No medications on file  Discontinued Medications   INSULIN LISPRO-AABC (LYUMJEV) 100 UNIT/ML SOLN    Inject 28 Units as directed daily with supper.    Allergies Allergies  Allergen Reactions   Morphine  REACTION: difficulty breathing    Past Medical History Past Medical History:  Diagnosis Date   Allergy    Diabetes mellitus without complication (HCC)    Uncontrolled daytime somnolence 03/07/2013    Past Surgical History Past Surgical History:  Procedure Laterality Date   MANDIBLE SURGERY  1987    Family History family history includes Arthritis in his mother; Asthma in his mother; COPD in his father; Diabetes in his father; Heart disease in his sister; Lupus in his sister.  Social History Social History   Socioeconomic History   Marital status: Married    Spouse name: Not on file   Number of children: 2   Years of education: Not on file   Highest education level: Not on file  Occupational History   Occupation: [employer]    Comment: [employer]  Tobacco Use   Smoking status: Every Day    Current packs/day: 1.00    Average packs/day: 1 pack/day for 24.0 years (24.0 ttl pk-yrs)    Types: Cigarettes   Smokeless tobacco: Never  Vaping Use   Vaping status: Never Used  Substance and Sexual Activity   Alcohol use: Yes    Comment: BEER   Drug use: No   Sexual activity: Yes    Comment: number of sex partners in the last 12 months 1  Other Topics Concern   Not  on file  Social History Narrative   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)   Social Determinants of Health   Financial Resource Strain: Not on file  Food Insecurity: Not on file  Transportation Needs: Not on file  Physical Activity: Not on file  Stress: Not on file  Social Connections: Not on file  Intimate Partner Violence: Not on file    Lab Results  Component Value Date   HGBA1C 8.2 (H) 11/16/2022   HGBA1C 8.0 (H) 09/04/2022   HGBA1C 8.2 (A) 06/26/2022   Lab Results  Component Value Date   CHOL 133 11/17/2021   Lab Results  Component Value Date   HDL 49.80 11/17/2021   Lab Results  Component Value Date   LDLCALC 68 11/17/2021   Lab Results  Component Value Date   TRIG 75.0 11/17/2021   Lab Results  Component Value Date   CHOLHDL 3 11/17/2021   Lab Results  Component Value Date   CREATININE 0.92 11/16/2022   Lab Results  Component Value Date   GFR 93.88 11/16/2022   Lab Results  Component Value Date   MICROALBUR <0.7 11/16/2022      Component Value Date/Time   NA 139 11/16/2022 0927   K 4.6 11/16/2022 0927   CL 102 11/16/2022 0927   CO2 28 11/16/2022 0927   GLUCOSE 70 11/16/2022 0927   BUN 10 11/16/2022 0927   CREATININE 0.92 11/16/2022 0927   CREATININE 0.84 12/16/2015 1138   CALCIUM 9.7 11/16/2022 0927   PROT 7.2 11/17/2021 1655   ALBUMIN 4.6 11/17/2021 1655   AST 16 11/17/2021 1655   ALT 20 11/17/2021 1655   ALKPHOS 90 11/17/2021 1655   BILITOT 0.7 11/17/2021 1655   GFRNONAA >60 01/08/2015 1736   GFRAA >60 01/08/2015 1736      Latest Ref Rng & Units 11/16/2022    9:27 AM 09/04/2022    9:07 AM 11/17/2021    4:55 PM  BMP  Glucose 70 - 99 mg/dL 70  409  811   BUN 6 - 23 mg/dL 10   12   Creatinine 9.14 - 1.50 mg/dL  0.92   0.83   Sodium 135 - 145 mEq/L 139   136   Potassium 3.5 - 5.1 mEq/L 4.6   4.3   Chloride 96 - 112 mEq/L 102   100   CO2 19 - 32 mEq/L 28   29   Calcium 8.4 - 10.5 mg/dL 9.7   9.4        Component Value Date/Time   WBC 8.3  05/03/2019 0833   RBC 4.96 05/03/2019 0833   HGB 16.4 05/03/2019 0833   HCT 48.7 05/03/2019 0833   PLT 317.0 05/03/2019 0833   MCV 98.2 05/03/2019 0833   MCV 92.3 09/30/2017 1546   MCH 31.5 (A) 09/30/2017 1546   MCH 32.6 01/08/2015 1736   MCHC 33.7 05/03/2019 0833   RDW 14.0 05/03/2019 0833   LYMPHSABS 2.5 01/08/2015 1736   MONOABS 0.6 01/08/2015 1736   EOSABS 0.2 01/08/2015 1736   BASOSABS 0.0 01/08/2015 1736     Parts of this note may have been dictated using voice recognition software. There may be variances in spelling and vocabulary which are unintentional. Not all errors are proofread. Please notify the Thereasa Parkin if any discrepancies are noted or if the meaning of any statement is not clear.

## 2022-12-03 ENCOUNTER — Telehealth: Payer: Self-pay | Admitting: "Endocrinology

## 2022-12-03 DIAGNOSIS — E109 Type 1 diabetes mellitus without complications: Secondary | ICD-10-CM

## 2022-12-03 MED ORDER — TRESIBA FLEXTOUCH 200 UNIT/ML ~~LOC~~ SOPN: 50.0000 [IU] | PEN_INJECTOR | Freq: Every day | SUBCUTANEOUS | 1 refills | Status: DC

## 2022-12-03 NOTE — Telephone Encounter (Signed)
I sent the pt a message on the portal and I also called his home phone and his wife has been notified.

## 2022-12-03 NOTE — Telephone Encounter (Signed)
Patient needing Alex Watson refill ASAP please advise patient when done. Call into [facility] on [address], [place]

## 2022-12-04 ENCOUNTER — Ambulatory Visit: Payer: BC Managed Care – PPO | Admitting: Internal Medicine

## 2022-12-04 ENCOUNTER — Encounter: Payer: Self-pay | Admitting: Family Medicine

## 2022-12-04 ENCOUNTER — Encounter: Payer: Self-pay | Admitting: Internal Medicine

## 2022-12-04 VITALS — BP 116/60 | HR 78 | Temp 97.9°F | Ht 78.0 in | Wt 236.0 lb

## 2022-12-04 DIAGNOSIS — F17200 Nicotine dependence, unspecified, uncomplicated: Secondary | ICD-10-CM

## 2022-12-04 DIAGNOSIS — J4489 Other specified chronic obstructive pulmonary disease: Secondary | ICD-10-CM

## 2022-12-04 DIAGNOSIS — J449 Chronic obstructive pulmonary disease, unspecified: Secondary | ICD-10-CM | POA: Insufficient documentation

## 2022-12-04 DIAGNOSIS — J4521 Mild intermittent asthma with (acute) exacerbation: Secondary | ICD-10-CM

## 2022-12-04 DIAGNOSIS — J439 Emphysema, unspecified: Secondary | ICD-10-CM

## 2022-12-04 MED ORDER — BREO ELLIPTA 200-25 MCG/ACT IN AEPB: 1.0000 | INHALATION_SPRAY | Freq: Every day | RESPIRATORY_TRACT | 11 refills | Status: DC

## 2022-12-04 MED ORDER — ALBUTEROL SULFATE HFA 108 (90 BASE) MCG/ACT IN AERS
1.0000 | INHALATION_SPRAY | Freq: Four times a day (QID) | RESPIRATORY_TRACT | 2 refills | Status: AC | PRN
Start: 2022-12-04 — End: ?

## 2022-12-04 NOTE — Progress Notes (Signed)
Alex Watson    [id number]    March 08, 1966  Primary Care Physician:Rudd, Bertram Millard, MD  Referring Physician: Loyola Mast, MD [address] [place],  [place] [address] Reason for Consultation: chronic cough Date of Consultation: 12/04/2022  Chief complaint:   Chief Complaint  Patient presents with   Consult    Referred for chronic cough, states nonproductive cough for 6 months     HPI: Alex Watson is a 57 y.o. man who presents for new patient evaluation for shortness of breath and chronic cough. Additional medical history of Type 1 DM.   Has seen PCP a few times over the last 6 months for this - has been given an inhaler and cough medicine. He didn't use this. Was given prednisone and this seemed to help.  Shortness of breath is with deep inspiration. 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   No childhood respiratory disease. He does have seasonal environmental allergies.   Never been hospitalized for pneumonia/bronchitis. Has had episodes as an outpatient usually about once/year, usually in the spring time.   Usually smoked 1 ppd and started cutting back to 0.5 ppd but can't seem to get beyond that.   He quit while he was in the [employer] when he was 45- was a Investment banker, operational. He quit cold [place].  Has had spirometry in the past for his respiratory fitting for asbestos exposure in his job.   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 Mostly at a desk however.  Exposures: lives at home with wife and boys. No one else smokes at home.  Smoking history: start smoking age 24. 1ppd x 33 years - 33 pack years.   Social History   Occupational History   Occupation: [employer]    Comment: Biomedical engineer  Tobacco Use    Smoking status: Every Day    Current packs/day: 1.00    Average packs/day: 1 pack/day for 24.0 years (24.0 ttl pk-yrs)    Types: Cigarettes   Smokeless tobacco: Never  Vaping Use   Vaping status: Never Used  Substance and Sexual Activity   Alcohol use: Yes    Comment: BEER   Drug use: No   Sexual activity: Yes    Comment: number of sex partners in the last 12 months 1    Relevant family history:  Family History  Problem Relation Age of Onset   Asthma Mother    Arthritis Mother    COPD Father    Diabetes Father    Heart disease Sister        eisenberger syndrome   Lupus Sister     Past Medical History:  Diagnosis Date   Allergy    Diabetes mellitus without complication (HCC)    Uncontrolled daytime somnolence 03/07/2013    Past Surgical History:  Procedure Laterality Date   MANDIBLE SURGERY  1987     Physical Exam: Blood pressure 116/60, pulse 78, temperature 97.9 F (36.6 C), temperature source Oral, height 6\' 6"  (1.981 m), weight 236 lb (107 kg), SpO2 97%. Gen:      No acute distress ENT:  mild nasal debris, no nasal polyps, mucus membranes moist Lungs:    No increased respiratory effort, symmetric chest wall excursion, clear to auscultation bilaterally,  no wheezes or crackles CV:         Regular rate and rhythm; no murmurs, rubs, or gallops.  No pedal edema Abd:      + bowel sounds; soft, non-tender; no distension MSK: no acute synovitis of DIP or PIP joints, no mechanics hands.  Skin:      Warm and dry; no rashes Neuro: normal speech, no focal facial asymmetry Psych: alert and oriented x3, normal mood and affect   Data Reviewed/Medical Decision Making:  Independent interpretation of tests: Imaging:  Review of patient's chest xray images April 2024 revealed no acute process. The patient's images have been independently reviewed by me.    PFTs:      No data to display          Labs:  Lab Results  Component Value Date   NA 139 11/16/2022   K  4.6 11/16/2022   CO2 28 11/16/2022   GLUCOSE 70 11/16/2022   BUN 10 11/16/2022   CREATININE 0.92 11/16/2022   CALCIUM 9.7 11/16/2022   GFR 93.88 11/16/2022   GFRNONAA >60 01/08/2015   Lab Results  Component Value Date   WBC 8.3 05/03/2019   HGB 16.4 05/03/2019   HCT 48.7 05/03/2019   MCV 98.2 05/03/2019   PLT 317.0 05/03/2019      Immunization status:  Immunization History  Administered Date(s) Administered   Influenza,inj,Quad PF,6+ Mos 02/01/2015, 04/05/2018, 03/13/2019   Influenza-Unspecified 04/07/2021   Janssen (J&J) SARS-COV-2 Vaccination 09/25/2019   PFIZER(Purple Top)SARS-COV-2 Vaccination 03/15/2020   Pfizer Covid-19 Vaccine Bivalent Booster 77yrs & up 04/07/2021   Pneumococcal Polysaccharide-23 05/03/2019   Tdap 04/23/2021   Zoster Recombinant(Shingrix) 04/23/2021     I reviewed prior external note(s) from primary care  I reviewed the result(s) of the labs and imaging as noted above.   I have ordered pft.    Assessment:  COPD, new diagnosis.  Tobacco use disorder Need for lung cancer screening  Plan/Recommendations:  Full set of PFTs before next visit.   Start taking Breo 1 puff once a day, gargle after use.  Take the albuterol rescue inhaler every 4 to 6 hours as needed for wheezing or shortness of breath. You can also take it 15 minutes before exercise or exertional activity. Side effects include heart racing or pounding, jitters or anxiety. If you have a history of an irregular heart rhythm, it can make this worse. Can also give some patients a hard time sleeping.  I am referring you to the lung cancer screening program - they will call you to schedule.  We discussed disease management and progression at length today.   Smoking Cessation Counseling:  1. The patient is an everyday smoker and symptomatic due to the following condition COPD 2. The patient is currently contemplative in quitting smoking. 3. I advised patient to quit smoking. 4. We  identified patient specific barriers to change.  5. I personally spent 3  minutes counseling the patient regarding tobacco use disorder. 6. We discussed management of stress and anxiety to help with smoking cessation, when applicable. 7. We discussed nicotine replacement therapy, Wellbutrin, Chantix as possible options. 8. I advised setting a quit date. 9. Follow?up arranged with our office to continue ongoing discussions. 10.Resources given to patient including quit hotline.    Return to Care: Return for follow up with PFT and same day appointment.  Durel Salts, MD Pulmonary and Critical Care Medicine [facility] Office:[phone]  CC: Veto Kemps Bertram Millard, MD

## 2022-12-04 NOTE — Patient Instructions (Addendum)
Please schedule follow up scheduled with myself in 4 months.  If my schedule is not open yet, we will contact you with a reminder closer to that time. Please call [phone] if you haven't heard from [place] a month before.   Before your next visit I would like you to have: Full set of PFTs before next visit.   Start taking Breo 1 puff once a day, gargle after use.  Take the albuterol rescue inhaler every 4 to 6 hours as needed for wheezing or shortness of breath. You can also take it 15 minutes before exercise or exertional activity. Side effects include heart racing or pounding, jitters or anxiety. If you have a history of an irregular heart rhythm, it can make this worse. Can also give some patients a hard time sleeping.  I am referring you to the lung cancer screening program - they will call you to schedule.   What are the benefits of quitting smoking? Quitting smoking can lower your chances of getting or dying from heart disease, lung disease, kidney failure, infection, or cancer. It can also lower your chances of getting osteoporosis, a condition that makes your bones weak. Plus, quitting smoking can help your skin look younger and reduce the chances that you will have problems with sex.  Quitting smoking will improve your health no matter how old you are, and no matter how long or how much you have smoked.  What should I do if I want to quit smoking? The letters in the word "START" can help you remember the steps to take: S = Set a quit date. T = Tell family, friends, and the people around you that you plan to quit. A = Anticipate or plan ahead for the tough times you'll face while quitting. R = Remove cigarettes and other tobacco products from your home, car, and work. T = Talk to your doctor about getting help to quit.  How can my doctor or nurse help? Your doctor or nurse can give you advice on the best way to quit. He or she can also put you in touch with counselors or other  people you can call for support. Plus, your doctor or nurse can give you medicines to: ?Reduce your craving for cigarettes ?Reduce the unpleasant symptoms that happen when you stop smoking (called "withdrawal symptoms"). You can also get help from a free phone line (1-800-QUIT-NOW) or go online to [employer].  What are the symptoms of withdrawal? The symptoms include: ?Trouble sleeping ?Being irritable, anxious or restless ?Getting frustrated or angry ?Having trouble thinking clearly  Some people who stop smoking become temporarily depressed. Some people need treatment for depression, such as counseling or antidepressant medicines. Depressed people might: ?No longer enjoy or care about doing the things they used to like to do ?Feel sad, down, hopeless, nervous, or cranky most of the day, almost every day ?Lose or gain weight ?Sleep too much or too little ?Feel tired or like they have no energy ?Feel guilty or like they are worth nothing ?Forget things or feel confused ?Move and speak more slowly than usual ?Act restless or have trouble staying still ?Think about death or suicide  If you think you might be depressed, see your doctor or nurse. Only someone trained in mental health can tell for sure if you are depressed. If you ever feel like you might hurt yourself, go straight to the nearest emergency department. Or you can call for an ambulance (in the [place] and [place],  dial 9-1-1) or call your doctor or nurse right away and tell them it is an emergency. You can also reach the [place] National Suicide Prevention Lifeline at 308 253 6624 or http://hill.com/.  How do medicines help you stop smoking? Different medicines work in different ways: ?Nicotine replacement therapy eases withdrawal and reduces your body's craving for nicotine, the main drug found in cigarettes. There are different forms of nicotine replacement, including skin patches, lozenges, gum, nasal sprays,  and "puffers" or inhalers. Many can be bought without a prescription, while others might require one. ?Bupropion is a prescription medicine that reduces your desire to smoke. This medicine is sold under the brand names Zyban and Wellbutrin. It is also available in a generic version, which is cheaper than brand name medicines. ?Varenicline (brand names: Chantix, Champix) is a prescription medicine that reduces withdrawal symptoms and cigarette cravings. If you think you'd like to take varenicline and you have a history of depression, anxiety, or heart disease, discuss this with your doctor or nurse before taking the medicine. Varenicline can also increase the effects of alcohol in some people. It's a good idea to limit drinking while you're taking it, at least until you know how it affects you.  How does counseling work? Counseling can happen during formal office visits or just over the phone. A counselor can help you: ?Figure out what triggers your smoking and what to do instead ?Overcome cravings ?Figure out what went wrong when you tried to quit before  What works best? Studies show that people have the best luck at quitting if they take medicines to help them quit and work with a Veterinary surgeon. It might also be helpful to combine nicotine replacement with one of the prescription medicines that help people quit. In some cases, it might even make sense to take bupropion and varenicline together.  What about e-cigarettes? Sometimes people wonder if using electronic cigarettes, or "e-cigarettes," might help them quit smoking. Using e-cigarettes is also called "vaping." Doctors do not recommend e-cigarettes in place of medicines and counseling. That's because e-cigarettes still contain nicotine as well as other substances that might be harmful. It's not clear how they can affect a person's health in the long term.  Will I gain weight if I quit? Yes, you might gain a few pounds. But quitting smoking will  have a much more positive effect on your health than weighing a few pounds more. Plus, you can help prevent some weight gain by being more active and eating less. Taking the medicine bupropion might help control weight gain.   What else can I do to improve my chances of quitting? You can: ?Start exercising. ?Stay away from smokers and places that you associate with smoking. If people close to you smoke, ask them to quit with you. ?Keep gum, hard candy, or something to put in your mouth handy. If you get a craving for a cigarette, try one of these instead. ?Don't give up, even if you start smoking again. It takes most people a few tries before they succeed.  What if I am pregnant and I smoke? If you are pregnant, it's really important for the health of your baby that you quit. Ask your doctor what options you have, and what is safest for your baby   Understanding COPD   What is COPD? COPD stands for chronic obstructive pulmonary (lung) disease. COPD is a general term used for several lung diseases.  COPD is an umbrella term and encompasses other  common diseases in  this group like chronic bronchitis and emphysema. Chronic asthma may also be included in this group. While some patients with COPD have only chronic bronchitis or emphysema, most patients have a combination of both.  You might hear these terms used in exchange for one another.   COPD adds to the work of the heart. Diseased lungs may reduce the amount of oxygen that goes to the blood. High blood pressure in blood vessels from the heart to the lungs makes it difficult for the heart to pump. Lung disease can also cause the body to produce too many red blood cells which may make the blood thicker and harder to pump.   Patients who have COPD with low oxygen levels may develop an enlarged heart (cor pulmonale). This condition weakens the heart and causes increased shortness of breath and swelling in the legs and feet.   Chronic  bronchitis Chronic bronchitis is irritation and inflammation (swelling) of the lining in the bronchial tubes (air passages). The irritation causes coughing and an excess amount of mucus in the airways. The swelling makes it difficult to get air in and out of the lungs. The small, hair-like structures on the inside of the airways (called cilia) may be damaged by the irritation. The cilia are then unable to help clean mucus from the airways.  Bronchitis is generally considered to be chronic when you have: a productive cough (cough up mucus) and shortness of breath that lasts about 3 months or more each year for 2 or more years in a row. Your doctor may define chronic bronchitis differently.   Emphysema Emphysema is the destruction, or breakdown, of the walls of the alveoli (air sacs) located at the end of the bronchial tubes. The damaged alveoli are not able to exchange oxygen and carbon dioxide between the lungs and the blood. The bronchioles lose their elasticity and collapse when you exhale, trapping air in the lungs. The trapped air keeps fresh air and oxygen from entering the lungs.   Who is affected by COPD? Emphysema and chronic bronchitis affect approximately 16 million people in the [place], or close to 11 percent of the population.   Symptoms of COPD  Shortness of breath  Shortness of breath with mild exercise (walking, using the stairs, etc.)  Chronic, productive cough (with mucus)  A feeling of "tightness" in the chest  Wheezing   What causes COPD? The two primary causes of COPD are cigarette smoking and alpha1-antitrypsin (AAT) deficiency. Air pollution and occupational dusts may also contribute to COPD, especially when the person exposed to these substances is a cigarette smoker.  Cigarette smoke causes COPD by irritating the airways and creating inflammation that narrows the airways, making it more difficult to breathe. Cigarette smoke also causes the cilia to stop working  properly so mucus and trapped particles are not cleaned from the airways. As a result, chronic cough and excess mucus production develop, leading to chronic bronchitis.  In some people, chronic bronchitis and infections can lead to destruction of the small airways, or emphysema.  AAT deficiency, an inherited disorder, can also lead to emphysema. Alpha antitrypsin (AAT) is a protective material produced in the liver and transported to the lungs to help combat inflammation. When there is not enough of the chemical AAT, the body is no longer protected from an enzyme in the white blood cells.   How is COPD diagnosed?  To diagnose COPD, the physician needs to know: Do you smoke?  Have you had chronic exposure  to dust or air pollutants?  Do other members of your family have lung disease?  Are you short of breath?  Do you get short of breath with exercise?  Do you have chronic cough and/or wheezing?  Do you cough up excess mucus?  To help with the diagnosis, the physician will conduct a thorough physical exam which includes:  Listening to your lungs and heart  Checking your blood pressure and pulse  Examining your nose and throat  Checking your feet and ankles for swelling   Laboratory and other tests Several laboratory and other tests are needed to confirm a diagnosis of COPD. These tests may include:  Chest X-ray to look for lung changes that could be caused by COPD   Spirometry and pulmonary function tests (PFTs) to determine lung volume and air flow  Pulse oximetry to measure the saturation of oxygen in the blood  Arterial blood gases (ABGs) to determine the amount of oxygen and carbon dioxide in the blood  Exercise testing to determine if the oxygen level in the blood drops during exercise   Treatment In the beginning stages of COPD, there is minimal shortness of breath that may be noticed only during exercise. As the disease progresses, shortness of breath may worsen and you may need to  wear an oxygen device.   To help control other symptoms of COPD, the following treatments and lifestyle changes may be prescribed.  Quitting smoking  Avoiding cigarette smoke and other irritants  Taking medications including: a. bronchodilators b. anti-inflammatory agents c. oxygen d. antibiotics  Maintaining a healthy diet  Following a structured exercise program such as pulmonary rehabilitation Preventing respiratory infections  Controlling stress   If your COPD progresses, you may be eligible to be evaluated for lung volume reduction surgery or lung transplantation. You may also be eligible to participate in certain clinical trials (research studies). Ask your health care providers about studies being conducted in your hospital.   What is the outlook? Although COPD can not be cured, its symptoms can be treated and your quality of life can be improved. Your prognosis or outlook for the future will depend on how well your lungs are functioning, your symptoms, and how well you respond to and follow your treatment plan.

## 2023-01-05 ENCOUNTER — Encounter: Payer: Self-pay | Admitting: Internal Medicine

## 2023-01-13 ENCOUNTER — Encounter: Payer: Self-pay | Admitting: Family Medicine

## 2023-01-13 ENCOUNTER — Ambulatory Visit: Payer: BC Managed Care – PPO | Admitting: Family Medicine

## 2023-01-13 VITALS — BP 118/68 | HR 76 | Temp 98.7°F | Ht 78.0 in | Wt 232.0 lb

## 2023-01-13 DIAGNOSIS — R053 Chronic cough: Secondary | ICD-10-CM

## 2023-01-13 DIAGNOSIS — J449 Chronic obstructive pulmonary disease, unspecified: Secondary | ICD-10-CM

## 2023-01-13 DIAGNOSIS — Z720 Tobacco use: Secondary | ICD-10-CM

## 2023-01-13 MED ORDER — BENZONATATE 200 MG PO CAPS
200.0000 mg | ORAL_CAPSULE | Freq: Two times a day (BID) | ORAL | 0 refills | Status: DC | PRN
Start: 2023-01-13 — End: 2023-08-31

## 2023-01-13 NOTE — Assessment & Plan Note (Addendum)
I will move to a chest C to evaluate this persistent cough. I also recommend he proceed with completing PFTs as previously ordered. I willa dd some Tessalon to try and reduce his coughing.

## 2023-01-13 NOTE — Progress Notes (Signed)
[facility]ARE [facility] [address] [place] [address] Dept: [phone] Dept Fax: [phone]  Office Visit  Subjective:    Patient ID: Alex Watson, male    DOB: 1965/07/21, 57 y.o..   MRN: [id number]  Chief Complaint  Patient presents with   Cough    C/o still having on going cough x 3 weeks.  Has taken cold/flu and cough drops.    History of Present Illness:  Patient is in today for complaining of worsening of his chronic cough over the past 3 weeks. Alex Watson had seen me in April complaining of a chronic cough since December. He and had an x-ray that showed a patchy infiltrate and was treated with antibiotics in Nov. He had a repeat chest x-ray in April, which was normal. He was seen in late July by pulmonology, who diagnosed him with COPD. They have requested he complete PFTs, which have not been done yet. He was started on a Breo Ellipta and an albuterol inhaler. Alex Watson has cut his smoking back to 1/4-1/2 ppd. I had prescribed bupropion, but he did not find this helpful.    Alex Watson his cough has been worse over the past 3 weeks. his child came down with a respiratory illness about 10 days ago. He has not done any specific testing at home. He does take a daily antihistamine. He denies heartburn issues.  Past Medical History: Patient Active Problem List   Diagnosis Date Noted   COPD (chronic obstructive pulmonary disease) case management patient (HCC) 12/04/2022   Tobacco use 04/23/2021   Hyperlipidemia 04/23/2021   Warts 04/23/2021   Osteoarthritis of spine with radiculopathy, cervical region 11/15/2015   Subarachnoid bleed (HCC) 01/04/2015   Cerebral contusion and laceration with loss of consciousness (HCC) 01/04/2015   Type 2 diabetes mellitus without complication (HCC) 08/18/2014   Past Surgical History:  Procedure Laterality Date   MANDIBLE SURGERY  1987   Family History  Problem Relation Age of Onset    Asthma Mother    Arthritis Mother    COPD Father    Diabetes Father    Heart disease Sister        eisenberger syndrome   Lupus Sister    Outpatient Medications Prior to Visit  Medication Sig Dispense Refill   albuterol (VENTOLIN HFA) 108 (90 Base) MCG/ACT inhaler Inhale 1-2 puffs into the lungs every 6 (six) hours as needed for wheezing or shortness of breath. 8 g 2   atorvastatin (LIPITOR) 40 MG tablet TAKE 1 TABLET(40 MG) BY MOUTH DAILY 90 tablet 3   Continuous Glucose Sensor (DEXCOM G7 SENSOR) MISC CHANGE EVERY 10 DAYS 9 each 2   fluticasone furoate-vilanterol (BREO ELLIPTA) 200-25 MCG/ACT AEPB Inhale 1 puff into the lungs daily. 1 each 11   insulin degludec (TRESIBA FLEXTOUCH) 200 UNIT/ML FlexTouch Pen Inject 50 Units into the skin daily. 27 mL 1   Insulin Lispro-aabc (LYUMJEV KWIKPEN) 100 UNIT/ML KwikPen Inject 28 units daily 15 mL 2   Insulin Syringe-Needle U-100 (B-D INS SYR ULTRAFINE 1CC/30G) 30G X 1/2" 1 ML MISC USE 1 SYRINGE/NEEDLE DAILY 30 each 0   traZODone (DESYREL) 50 MG tablet TAKE 1/2 TO 1 TABLET(25 TO 50 MG) BY MOUTH AT BEDTIME AS NEEDED FOR SLEEP 30 tablet 5   No facility-administered medications prior to visit.   Allergies  Allergen Reactions   Morphine     REACTION: difficulty breathing     Objective:   Today's Vitals  01/13/23 1501  BP: 118/68  Pulse: 76  Temp: 98.7 F (37.1 C)  TempSrc: Temporal  SpO2: 98%  Weight: 232 lb (105.2 kg)  Height: 6\' 6"  (1.981 m)   Body mass index is 26.81 kg/m.   General: Well developed, well nourished. No acute distress. HEENT: Normocephalic, non-traumatic. Conjunctiva clear. External ears normal. EAC and TMs   normal bilaterally. Nose mildly red, but without significant swelling or rhinorrhea. Mucous   membranes moist. Oropharynx clear. Good dentition. Neck: Supple. No lymphadenopathy. No thyromegaly. Lungs: Clear to auscultation bilaterally. No wheezing, rales or rhonchi. Psych: Alert and oriented. Normal mood and  affect.  Health Maintenance Due  Topic Date Due   HIV Screening  Never done   Hepatitis C Screening  Never done   Lung Cancer Screening  09/22/2017   Colonoscopy  05/14/2019   Zoster Vaccines- Shingrix (2 of 2) 06/18/2021   OPHTHALMOLOGY EXAM  04/29/2022     Assessment & Plan:   Problem List Items Addressed This Visit       Other   Chronic cough - Primary    I will move to a chest C to evaluate this persistent cough. I also recommend he proceed with completing PFTs as previously ordered. I willa dd some Tessalon to try and reduce his coughing.      Relevant Medications   benzonatate (TESSALON) 200 MG capsule   Other Relevant Orders   CT Chest Wo Contrast   COPD (chronic obstructive pulmonary disease) case management patient [facility])    Diagnosed by pulmonology. Currently on Breo Ellipta and PRN albuterol. Needs to complete PFTs.      Tobacco use    Congratulated Alex Watson on reducing his tobacco use. I support ongoing efforts at stopping smoking.       Return in about 4 weeks (around 02/10/2023) for Reassessment.   Loyola Mast, MD

## 2023-01-13 NOTE — Assessment & Plan Note (Signed)
Diagnosed by pulmonology. Currently on Breo Ellipta and PRN albuterol. Needs to complete PFTs.

## 2023-01-13 NOTE — Assessment & Plan Note (Signed)
Congratulated Alex Watson on reducing his tobacco use. I support ongoing efforts at stopping smoking.

## 2023-02-02 ENCOUNTER — Ambulatory Visit
Admission: RE | Admit: 2023-02-02 | Discharge: 2023-02-02 | Disposition: A | Discharge status: Home / Self Care | Payer: BC Managed Care – PPO | Source: Ambulatory Visit | Attending: Family Medicine

## 2023-02-02 DIAGNOSIS — R053 Chronic cough: Secondary | ICD-10-CM | POA: Diagnosis not present

## 2023-02-02 DIAGNOSIS — I7111 Aneurysm of the ascending aorta, ruptured: Secondary | ICD-10-CM | POA: Diagnosis not present

## 2023-02-02 DIAGNOSIS — R079 Chest pain, unspecified: Secondary | ICD-10-CM | POA: Diagnosis not present

## 2023-02-02 DIAGNOSIS — D3502 Benign neoplasm of left adrenal gland: Secondary | ICD-10-CM | POA: Diagnosis not present

## 2023-02-11 ENCOUNTER — Encounter: Payer: Self-pay | Admitting: Family Medicine

## 2023-02-11 ENCOUNTER — Ambulatory Visit: Payer: BC Managed Care – PPO | Admitting: Family Medicine

## 2023-02-11 VITALS — BP 118/70 | HR 84 | Temp 97.2°F | Ht 78.0 in | Wt 230.2 lb

## 2023-02-11 DIAGNOSIS — J449 Chronic obstructive pulmonary disease, unspecified: Secondary | ICD-10-CM

## 2023-02-11 DIAGNOSIS — Z720 Tobacco use: Secondary | ICD-10-CM | POA: Diagnosis not present

## 2023-02-11 NOTE — Assessment & Plan Note (Signed)
Continue to strongly advise smoking cessation. Discussed tapering strategies that may assist him.

## 2023-02-11 NOTE — Progress Notes (Signed)
[facility]ARE [facility] [address] [place] [address] Dept: [phone] Dept Fax: [phone]  Office Visit  Subjective:    Patient ID: Alex Watson, male    DOB: 01-29-1966, 57 y.o..   MRN: [id number]  Chief Complaint  Patient presents with   Follow-up    F/u had CT done on 02/02/23.     History of Present Illness:  Patient is in today for reassessment of his chronic cough.  Mr. Nez had seen me in April complaining of a chronic cough since December. He and had an x-ray that showed a patchy infiltrate and was treated with antibiotics in Nov. He had a repeat chest x-ray in April, which was normal. He was seen in late July by pulmonology, who diagnosed him with COPD. They have requested he complete PFTs, which have not been done yet. He was started on a Breo Ellipta and an albuterol inhaler. Mr. Hackler has cut his smoking back to 1/4-1/2 ppd. I had prescribed bupropion, but he did not find this helpful. I had seen him on 01/13/2023 with a recent worsening of his cough. I did order a chest CT for further evaluation. Because he works out of town for [employer], he still has not been able to get in for the PFTs.  Past Medical History: Patient Active Problem List   Diagnosis Date Noted   COPD (chronic obstructive pulmonary disease) case management patient (HCC) 12/04/2022   Chronic cough 08/28/2022   Tobacco use 04/23/2021   Hyperlipidemia 04/23/2021   Warts 04/23/2021   Osteoarthritis of spine with radiculopathy, cervical region 11/15/2015   Subarachnoid bleed (HCC) 01/04/2015   Cerebral contusion and laceration with loss of consciousness (HCC) 01/04/2015   Type 2 diabetes mellitus without complication (HCC) 08/18/2014   Past Surgical History:  Procedure Laterality Date   MANDIBLE SURGERY  1987   Family History  Problem Relation Age of Onset   Asthma Mother    Arthritis Mother    COPD Father    Diabetes Father    Heart  disease Sister        eisenberger syndrome   Lupus Sister    Outpatient Medications Prior to Visit  Medication Sig Dispense Refill   albuterol (VENTOLIN HFA) 108 (90 Base) MCG/ACT inhaler Inhale 1-2 puffs into the lungs every 6 (six) hours as needed for wheezing or shortness of breath. 8 g 2   atorvastatin (LIPITOR) 40 MG tablet TAKE 1 TABLET(40 MG) BY MOUTH DAILY 90 tablet 3   benzonatate (TESSALON) 200 MG capsule Take 1 capsule (200 mg total) by mouth 2 (two) times daily as needed for cough. 20 capsule 0   Continuous Glucose Sensor (DEXCOM G7 SENSOR) MISC CHANGE EVERY 10 DAYS 9 each 2   fluticasone furoate-vilanterol (BREO ELLIPTA) 200-25 MCG/ACT AEPB Inhale 1 puff into the lungs daily. 1 each 11   insulin degludec (TRESIBA FLEXTOUCH) 200 UNIT/ML FlexTouch Pen Inject 50 Units into the skin daily. 27 mL 1   Insulin Lispro-aabc (LYUMJEV KWIKPEN) 100 UNIT/ML KwikPen Inject 28 units daily 15 mL 2   Insulin Syringe-Needle U-100 (B-D INS SYR ULTRAFINE 1CC/30G) 30G X 1/2" 1 ML MISC USE 1 SYRINGE/NEEDLE DAILY 30 each 0   traZODone (DESYREL) 50 MG tablet TAKE 1/2 TO 1 TABLET(25 TO 50 MG) BY MOUTH AT BEDTIME AS NEEDED FOR SLEEP 30 tablet 5   No facility-administered medications prior to visit.   Allergies  Allergen Reactions   Morphine     REACTION: difficulty  breathing     Objective:   Today's Vitals   02/11/23 0839  BP: 118/70  Pulse: 84  Temp: (!) 97.2 F (36.2 C)  TempSrc: Temporal  SpO2: 98%  Weight: 230 lb 3.2 oz (104.4 kg)  Height: 6\' 6"  (1.981 m)   Body mass index is 26.6 kg/m.   General: Well developed, well nourished. No acute distress. Lungs: Clear to auscultation bilaterally. No wheezing, rales or rhonchi. Psych: Alert and oriented. Normal mood and affect.  Health Maintenance Due  Topic Date Due   HIV Screening  Never done   Hepatitis C Screening  Never done   Lung Cancer Screening  09/22/2017   Colonoscopy  05/14/2019   Zoster Vaccines- Shingrix (2 of 2)  06/18/2021   OPHTHALMOLOGY EXAM  04/29/2022     Assessment & Plan:   Problem List Items Addressed This Visit       Other   COPD (chronic obstructive pulmonary disease) case management patient (HCC) - Primary    Diagnosed by pulmonology. Thi sis the likely source of his chronic cough with occasional acute exacerbations. Currently on Breo Ellipta and PRN albuterol (which he has not needed). Needs to complete PFTs. He is scheduled for Monday, but notes he is headed to [place] to do a water system repair related to the impact of [employer]. The CT scan report has not returned. I will contact him regarding results when available.      Tobacco use    Continue to strongly advise smoking cessation. Discussed tapering strategies that may assist him.       Return in about 3 months (around 05/14/2023) for Reassessment.   Loyola Mast, MD

## 2023-02-11 NOTE — Assessment & Plan Note (Signed)
Diagnosed by pulmonology. Thi sis the likely source of his chronic cough with occasional acute exacerbations. Currently on Breo Ellipta and PRN albuterol (which he has not needed). Needs to complete PFTs. He is scheduled for Monday, but notes he is headed to [place] to do a water system repair related to the impact of [employer]. The CT scan report has not returned. I will contact him regarding results when available.

## 2023-02-15 ENCOUNTER — Encounter (HOSPITAL_BASED_OUTPATIENT_CLINIC_OR_DEPARTMENT_OTHER): Payer: BC Managed Care – PPO

## 2023-02-22 ENCOUNTER — Encounter: Payer: Self-pay | Admitting: "Endocrinology

## 2023-02-22 ENCOUNTER — Encounter: Payer: Self-pay | Admitting: Family Medicine

## 2023-02-22 ENCOUNTER — Ambulatory Visit: Payer: BC Managed Care – PPO | Admitting: "Endocrinology

## 2023-02-22 VITALS — BP 123/63 | HR 73 | Ht 78.0 in | Wt 230.0 lb

## 2023-02-22 DIAGNOSIS — E1065 Type 1 diabetes mellitus with hyperglycemia: Secondary | ICD-10-CM | POA: Diagnosis not present

## 2023-02-22 DIAGNOSIS — E7800 Pure hypercholesterolemia, unspecified: Secondary | ICD-10-CM

## 2023-02-22 DIAGNOSIS — D3502 Benign neoplasm of left adrenal gland: Secondary | ICD-10-CM | POA: Insufficient documentation

## 2023-02-22 DIAGNOSIS — I700 Atherosclerosis of aorta: Secondary | ICD-10-CM | POA: Insufficient documentation

## 2023-02-22 DIAGNOSIS — I7121 Aneurysm of the ascending aorta, without rupture: Secondary | ICD-10-CM | POA: Insufficient documentation

## 2023-02-22 LAB — POCT GLYCOSYLATED HEMOGLOBIN (HGB A1C): Hemoglobin A1C: 8 % — AB (ref 4.0–5.6)

## 2023-02-22 MED ORDER — LYUMJEV KWIKPEN 100 UNIT/ML ~~LOC~~ SOPN
22.0000 [IU] | PEN_INJECTOR | Freq: Every day | SUBCUTANEOUS | 2 refills | Status: AC
Start: 2023-02-22 — End: 2023-05-23

## 2023-02-22 MED ORDER — TRESIBA FLEXTOUCH 200 UNIT/ML ~~LOC~~ SOPN
54.0000 [IU] | PEN_INJECTOR | Freq: Every day | SUBCUTANEOUS | 2 refills | Status: AC
Start: 2023-02-22 — End: 2023-05-23

## 2023-02-22 MED ORDER — DEXCOM G7 SENSOR MISC
2 refills | Status: DC
Start: 2023-02-22 — End: 2024-03-22

## 2023-02-22 NOTE — Progress Notes (Signed)
Outpatient Endocrinology Note Alex Balfour, MD  02/24/23   Alex Watson 1965-10-03 [id number]  Referring Provider: Loyola Mast, MD Primary Care Provider: Loyola Mast, MD Reason for consultation: Subjective   Assessment & Plan  Diagnoses and all orders for this visit:  Uncontrolled type 1 diabetes mellitus with hyperglycemia (HCC) -     POCT glycosylated hemoglobin (Hb A1C) -     Continuous Glucose Sensor (DEXCOM G7 SENSOR) MISC; Change every 10 days -     insulin degludec (TRESIBA FLEXTOUCH) 200 UNIT/ML FlexTouch Pen; Inject 54 Units into the skin daily. -     Insulin Lispro-aabc (LYUMJEV KWIKPEN) 100 UNIT/ML KwikPen; Inject 22 Units into the skin daily in the afternoon.  Pure hypercholesterolemia    Diabetes Type I complicated by hyperglycemia   Lab Results  Component Value Date   GFR 93.88 11/16/2022   Hba1c goal less than 7, current Hba1c is  Lab Results  Component Value Date   HGBA1C 8.0 (A) 02/22/2023   Tresiba 54 units daily    Lyumjev 18-22 units around 15 min before supper  Instructed to eat 3 equal sized meals rather than 1 big and 2 minor meals   No known contraindications to any of above medications  -Last LD and Tg are as follows: Lab Results  Component Value Date   LDLCALC 55 11/20/2022    Lab Results  Component Value Date   TRIG 64.0 11/20/2022   -On atorvastatin 40 mg every day -Follow low fat diet and exercise   -Blood pressure goal <140/90 - Microalbumin/creatinine goal < 30 -Last MA/Cr is as follows: Lab Results  Component Value Date   MICROALBUR <0.7 11/16/2022   -not on ACE/ARB -diet changes including salt restriction -limit eating outside -counseled BP targets per standards of diabetes care -uncontrolled blood pressure can lead to retinopathy, nephropathy and cardiovascular and atherosclerotic heart disease  Reviewed and counseled on: -A1C target -Blood sugar targets -Complications of uncontrolled diabetes   -Checking blood sugar before meals and bedtime and bring log next visit -All medications with mechanism of action and side effects -Hypoglycemia management: rule of 15's, Glucagon Emergency Kit and medical alert ID -low-carb low-fat plate-method diet -At least 20 minutes of physical activity per day -Annual dilated retinal eye exam and foot exam -compliance and follow up needs -follow up as scheduled or earlier if problem gets worse  Call if blood sugar is less than 70 or consistently above 250    Take a 15 gm snack of carbohydrate at bedtime before you go to sleep if your blood sugar is less than 100.    If you are going to fast after midnight for a test or procedure, ask your physician for instructions on how to reduce/decrease your insulin dose.    Call if blood sugar is less than 70 or consistently above 250  -Treating a low sugar by rule of 15  (15 gms of sugar every 15 min until sugar is more than 70) If you feel your sugar is low, test your sugar to be sure If your sugar is low (less than 70), then take 15 grams of a fast acting Carbohydrate (3-4 glucose tablets or glucose gel or 4 ounces of juice or regular soda) Recheck your sugar 15 min after treating low to make sure it is more than 70 If sugar is still less than 70, treat again with 15 grams of carbohydrate          Don't drive the  hour of hypoglycemia  If unconscious/unable to eat or drink by mouth, use glucagon injection or nasal spray baqsimi and call 911. Can repeat again in 15 min if still unconscious.  Return in about 3 months (around 05/25/2023) for visit.   I have reviewed current medications, nurse's notes, allergies, vital signs, past medical and surgical history, family medical history, and social history for this encounter. Counseled patient on symptoms, examination findings, lab findings, imaging results, treatment decisions and monitoring and prognosis. The patient understood the recommendations and agrees with  the treatment plan. All questions regarding treatment plan were fully answered.  Alex [place], MD  02/24/23    History of Present Illness Alex Watson is a 57 y.o. year old male who presents for follow up on Type I diabetes mellitus.  Alex Watson was first diagnosed in 2016.   Diabetes education +  Current regimen: Tresiba 50 units daily    Lyumjev 20-24 units around 15 min before supper  Previous history:  Insulin was started about 3 months after his diagnosis since oral agents failed Appears to be mostly on some form of glargine insulin along with mealtime insulin  COMPLICATIONS -  MI/Stroke -  retinopathy -  neuropathy -  nephropathy  BLOOD SUGAR DATA  CGM interpretation: At today's visit, we reviewed her CGM downloads. The full report is scanned in the media. Reviewing the CGM trends, BG are elevated in morning with some lows after supper.  Physical Exam  BP 123/63   Pulse 73   Ht 6\' 6"  (1.981 m)   Wt 230 lb (104.3 kg)   SpO2 98%   BMI 26.58 kg/m    Constitutional: well developed, well nourished Head: normocephalic, atraumatic Eyes: sclera anicteric, no redness Neck: supple Lungs: normal respiratory effort Neurology: alert and oriented Skin: dry, no appreciable rashes Musculoskeletal: no appreciable defects Psychiatric: normal mood and affect Diabetic Foot Exam - Simple   No data filed      Current Medications Patient's Medications  New Prescriptions   No medications on file  Previous Medications   ALBUTEROL (VENTOLIN HFA) 108 (90 BASE) MCG/ACT INHALER    Inhale 1-2 puffs into the lungs every 6 (six) hours as needed for wheezing or shortness of breath.   ATORVASTATIN (LIPITOR) 40 MG TABLET    TAKE 1 TABLET(40 MG) BY MOUTH DAILY   BENZONATATE (TESSALON) 200 MG CAPSULE    Take 1 capsule (200 mg total) by mouth 2 (two) times daily as needed for cough.   FLUTICASONE FUROATE-VILANTEROL (BREO ELLIPTA) 200-25 MCG/ACT AEPB    Inhale 1 puff into the lungs  daily.   INSULIN SYRINGE-NEEDLE U-100 (B-D INS SYR ULTRAFINE 1CC/30G) 30G X 1/2" 1 ML MISC    USE 1 SYRINGE/NEEDLE DAILY   TRAZODONE (DESYREL) 50 MG TABLET    TAKE 1/2 TO 1 TABLET(25 TO 50 MG) BY MOUTH AT BEDTIME AS NEEDED FOR SLEEP  Modified Medications   Modified Medication Previous Medication   CONTINUOUS GLUCOSE SENSOR (DEXCOM G7 SENSOR) MISC Continuous Glucose Sensor (DEXCOM G7 SENSOR) MISC      Change every 10 days    CHANGE EVERY 10 DAYS   INSULIN DEGLUDEC (TRESIBA FLEXTOUCH) 200 UNIT/ML FLEXTOUCH PEN insulin degludec (TRESIBA FLEXTOUCH) 200 UNIT/ML FlexTouch Pen      Inject 54 Units into the skin daily.    Inject 50 Units into the skin daily.   INSULIN LISPRO-AABC (LYUMJEV KWIKPEN) 100 UNIT/ML KWIKPEN Insulin Lispro-aabc (LYUMJEV KWIKPEN) 100 UNIT/ML KwikPen      Inject  22 Units into the skin daily in the afternoon.    Inject 28 units daily  Discontinued Medications   No medications on file    Allergies Allergies  Allergen Reactions   Morphine     REACTION: difficulty breathing    Past Medical History Past Medical History:  Diagnosis Date   Allergy    Diabetes mellitus without complication (HCC)    Uncontrolled daytime somnolence 03/07/2013    Past Surgical History Past Surgical History:  Procedure Laterality Date   MANDIBLE SURGERY  1987    Family History family history includes Arthritis in his mother; Asthma in his mother; COPD in his father; Diabetes in his father; Heart disease in his sister; Lupus in his sister.  Social History Social History   Socioeconomic History   Marital status: Married    Spouse name: Not on file   Number of children: 2   Years of education: Not on file   Highest education level: Some college, no degree  Occupational History   Occupation: [employer]    Comment: [employer]  Tobacco Use   Smoking status: Every Day    Current packs/day: 0.50    Average packs/day: 1 pack/day for 33.8 years (33.4 ttl pk-yrs)     Types: Cigarettes    Start date: 2024   Smokeless tobacco: Never  Vaping Use   Vaping status: Never Used  Substance and Sexual Activity   Alcohol use: Yes    Comment: BEER   Drug use: No   Sexual activity: Yes    Comment: number of sex partners in the last 12 months 1  Other Topics Concern   Not on file  Social History Narrative   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)   Social Determinants of Health   Financial Resource Strain: Low Risk  (02/10/2023)   Overall Financial Resource Strain (CARDIA)    Difficulty of Paying Living Expenses: Not hard at all  Food Insecurity: No Food Insecurity (02/10/2023)   Hunger Vital Sign    Worried About Running Out of Food in the Last Year: Never true    Ran Out of Food in the Last Year: Never true  Transportation Needs: No Transportation Needs (02/10/2023)   PRAPARE - Administrator, Civil Service (Medical): No    Lack of Transportation (Non-Medical): No  Physical Activity: Unknown (02/10/2023)   Exercise Vital Sign    Days of Exercise per Week: Patient declined    Minutes of Exercise per Session: Not on file  Stress: Stress Concern Present (02/10/2023)   [employer] of Occupational Health - Occupational Stress Questionnaire    Feeling of Stress : To some extent  Social Connections: Moderately Isolated (02/10/2023)   Social Connection and Isolation Panel [NHANES]    Frequency of Communication with Friends and Family: More than three times a week    Frequency of Social Gatherings with Friends and Family: Once a week    Attends Religious Services: Never    Database administrator or Organizations: No    Attends Banker Meetings: Not on file    Marital Status: Married  Intimate Partner Violence: Not on file    Lab Results  Component Value Date   HGBA1C 8.0 (A) 02/22/2023   HGBA1C 8.2 (H) 11/16/2022   HGBA1C 8.0 (H) 09/04/2022   Lab Results  Component Value Date   CHOL 105 11/20/2022   Lab Results  Component Value Date    HDL 37.30 (L) 11/20/2022   Lab  Results  Component Value Date   LDLCALC 55 11/20/2022   Lab Results  Component Value Date   TRIG 64.0 11/20/2022   Lab Results  Component Value Date   CHOLHDL 3 11/20/2022   Lab Results  Component Value Date   CREATININE 0.92 11/16/2022   Lab Results  Component Value Date   GFR 93.88 11/16/2022   Lab Results  Component Value Date   MICROALBUR <0.7 11/16/2022      Component Value Date/Time   NA 139 11/16/2022 0927   K 4.6 11/16/2022 0927   CL 102 11/16/2022 0927   CO2 28 11/16/2022 0927   GLUCOSE 70 11/16/2022 0927   BUN 10 11/16/2022 0927   CREATININE 0.92 11/16/2022 0927   CREATININE 0.84 12/16/2015 1138   CALCIUM 9.7 11/16/2022 0927   PROT 6.8 11/20/2022 0855   ALBUMIN 4.4 11/20/2022 0855   AST 16 11/20/2022 0855   ALT 24 11/20/2022 0855   ALKPHOS 87 11/20/2022 0855   BILITOT 0.5 11/20/2022 0855   GFRNONAA >60 01/08/2015 1736   GFRAA >60 01/08/2015 1736      Latest Ref Rng & Units 11/16/2022    9:27 AM 09/04/2022    9:07 AM 11/17/2021    4:55 PM  BMP  Glucose 70 - 99 mg/dL 70  981  191   BUN 6 - 23 mg/dL 10   12   Creatinine 4.78 - 1.50 mg/dL 2.95   6.21   Sodium 308 - 145 mEq/L 139   136   Potassium 3.5 - 5.1 mEq/L 4.6   4.3   Chloride 96 - 112 mEq/L 102   100   CO2 19 - 32 mEq/L 28   29   Calcium 8.4 - 10.5 mg/dL 9.7   9.4        Component Value Date/Time   WBC 8.3 05/03/2019 0833   RBC 4.96 05/03/2019 0833   HGB 16.4 05/03/2019 0833   HCT 48.7 05/03/2019 0833   PLT 317.0 05/03/2019 0833   MCV 98.2 05/03/2019 0833   MCV 92.3 09/30/2017 1546   MCH 31.5 (A) 09/30/2017 1546   MCH 32.6 01/08/2015 1736   MCHC 33.7 05/03/2019 0833   RDW 14.0 05/03/2019 0833   LYMPHSABS 2.5 01/08/2015 1736   MONOABS 0.6 01/08/2015 1736   EOSABS 0.2 01/08/2015 1736   BASOSABS 0.0 01/08/2015 1736     Parts of this note may have been dictated using voice recognition software. There may be variances in spelling and vocabulary  which are unintentional. Not all errors are proofread. Please notify the Thereasa Parkin if any discrepancies are noted or if the meaning of any statement is not clear.

## 2023-02-22 NOTE — Patient Instructions (Signed)

## 2023-02-24 NOTE — Addendum Note (Signed)
Addended by: Altamese [place] on: 02/24/2023 09:20 AM   Modules accepted: Orders

## 2023-03-08 ENCOUNTER — Ambulatory Visit (HOSPITAL_BASED_OUTPATIENT_CLINIC_OR_DEPARTMENT_OTHER): Payer: BC Managed Care – PPO | Admitting: Internal Medicine

## 2023-03-08 DIAGNOSIS — J4489 Other specified chronic obstructive pulmonary disease: Secondary | ICD-10-CM | POA: Diagnosis not present

## 2023-03-08 DIAGNOSIS — J439 Emphysema, unspecified: Secondary | ICD-10-CM

## 2023-03-08 LAB — PULMONARY FUNCTION TEST
DL/VA % pred: 94 %
DL/VA: 3.97 ml/min/mmHg/L
DLCO cor % pred: 79 %
DLCO cor: 27.7 ml/min/mmHg
DLCO unc % pred: 79 %
DLCO unc: 27.7 ml/min/mmHg
FEF 25-75 Post: 4.61 L/s
FEF 25-75 Pre: 3.26 L/s
FEF2575-%Change-Post: 41 %
FEF2575-%Pred-Post: 117 %
FEF2575-%Pred-Pre: 83 %
FEV1-%Change-Post: 7 %
FEV1-%Pred-Post: 87 %
FEV1-%Pred-Pre: 81 %
FEV1-Post: 4.1 L
FEV1-Pre: 3.82 L
FEV1FVC-%Change-Post: 2 %
FEV1FVC-%Pred-Pre: 101 %
FEV6-%Change-Post: 5 %
FEV6-%Pred-Post: 86 %
FEV6-%Pred-Pre: 81 %
FEV6-Post: 5.12 L
FEV6-Pre: 4.84 L
FEV6FVC-%Change-Post: 1 %
FEV6FVC-%Pred-Post: 103 %
FEV6FVC-%Pred-Pre: 102 %
FVC-%Change-Post: 4 %
FVC-%Pred-Post: 83 %
FVC-%Pred-Pre: 79 %
FVC-Post: 5.13 L
FVC-Pre: 4.91 L
Post FEV1/FVC ratio: 80 %
Post FEV6/FVC ratio: 100 %
Pre FEV1/FVC ratio: 78 %
Pre FEV6/FVC Ratio: 99 %
RV % pred: 79 %
RV: 2.01 L
TLC % pred: 83 %
TLC: 6.99 L

## 2023-03-08 NOTE — Progress Notes (Signed)
Full PFT Performed Today  

## 2023-03-08 NOTE — Patient Instructions (Signed)
Full PFT Performed Today  

## 2023-03-17 ENCOUNTER — Ambulatory Visit: Payer: BC Managed Care – PPO | Admitting: Internal Medicine

## 2023-03-17 ENCOUNTER — Encounter: Payer: Self-pay | Admitting: Internal Medicine

## 2023-03-17 VITALS — BP 114/68 | HR 73 | Ht 77.0 in | Wt 228.8 lb

## 2023-03-17 DIAGNOSIS — R053 Chronic cough: Secondary | ICD-10-CM

## 2023-03-17 DIAGNOSIS — F17200 Nicotine dependence, unspecified, uncomplicated: Secondary | ICD-10-CM

## 2023-03-17 MED ORDER — FLUTICASONE PROPIONATE 50 MCG/ACT NA SUSP: 1.0000 | Freq: Every day | NASAL | 3 refills | Status: DC

## 2023-03-17 MED ORDER — LEVOCETIRIZINE DIHYDROCHLORIDE 5 MG PO TABS: 5.0000 mg | ORAL_TABLET | Freq: Every evening | ORAL | 5 refills | Status: DC

## 2023-03-17 MED ORDER — PANTOPRAZOLE SODIUM 40 MG PO TBEC: 40.0000 mg | DELAYED_RELEASE_TABLET | Freq: Every day | ORAL | 5 refills | Status: DC

## 2023-03-17 NOTE — Patient Instructions (Addendum)
It was a pleasure to see you today!  Please schedule follow up scheduled with myself in 3 months.  If my schedule is not open yet, we will contact you with a reminder closer to that time. Please call [phone] if you haven't heard from [place] a month before, and always call us sooner if issues or concerns arise. You can also send [place] a message through MyChart, but but aware that this is not to be used for urgent issues and it may take up to 5-7 days to receive a reply. Please be aware that you will likely be able to view your results before I have a chance to respond to them. Please give [place] 5 business days to respond to any non-urgent results.    Good news - your lung function is normal. But I still want you to work on quitting smoking! You can stop the Breo since it didn't help.   I think your cough could be most related to post nasal drainage. We will start flonase and I have sent a new anti-histamine (allergy medication) to your pharmacy. You can stop the one you are currently taking ( just make sure they aren't the same or I'll send in something else.)  Flonase - 1 spray on each side of your nose twice a day for first week, then 1 spray on each side.   Instructions for use: If you also use a saline nasal spray or rinse, use that first. Position the head with the chin slightly tucked. Use the right hand to spray into the left nostril and the right hand to spray into the left nostril.   Point the bottle away from the septum of your nose (cartilage that divides the two sides of your nose).  Hold the nostril closed on the opposite side from where you will spray Spray once and gently sniff to pull the medicine into the higher parts of your nose.  Don't sniff too hard as the medicine will drain down the back of your throat instead. Repeat with a second spray on the same side if prescribed. Repeat on the other side of your nose.

## 2023-03-17 NOTE — Progress Notes (Signed)
Alex Watson    [id number]    [birth year]  Primary Care Physician:Rudd, Bertram Millard, MD Date of Appointment: 03/17/2023 Established Patient Visit  Chief complaint:   Chief Complaint  Patient presents with   Follow-up    Pft f/u pt states he is still dealing with a cough     HPI: Alex Watson is a 57 y.o. man with ongoing tobacco use disorder and chronic cough.   Interval Updates: Here for follow up after PFTs. Normal PFT. Denies dyspnea.   He can go a couple of days without coughing, and some days he coughs multiple times a day. He feels like there's a tickle in his throat. He does have seasonal allergies. He takes over the counter allergy medicine which he has been on for some time. He does clear his throat.     I have reviewed the patient's family social and past medical history and updated as appropriate.   Past Medical History:  Diagnosis Date   Allergy    Diabetes mellitus without complication (HCC)    Uncontrolled daytime somnolence 03/07/2013    Past Surgical History:  Procedure Laterality Date   MANDIBLE SURGERY  1987    Family History  Problem Relation Age of Onset   Asthma Mother    Arthritis Mother    COPD Father    Diabetes Father    Heart disease Sister        eisenberger syndrome   Lupus Sister     Social History   Occupational History   Occupation: [employer]    Comment: [employer]  Tobacco Use   Smoking status: Every Day    Current packs/day: 0.50    Average packs/day: 1 pack/day for 33.8 years (33.4 ttl pk-yrs)    Types: Cigarettes    Start date: 2024   Smokeless tobacco: Never  Vaping Use   Vaping status: Never Used  Substance and Sexual Activity   Alcohol use: Yes    Comment: BEER   Drug use: No   Sexual activity: Yes    Comment: number of sex partners in the last 12 months 1     Physical Exam: Blood pressure 114/68, pulse 73, height 6\' 5"  (1.956 m), weight 228 lb 12.8 oz (103.8 kg), SpO2  98%.  Gen:      No acute distress ENT:  +mild nasal debris no nasal polyps, mucus membranes moist Lungs:    No increased respiratory effort, symmetric chest wall excursion, clear to auscultation bilaterally, no wheezes or crackles CV:         Regular rate and rhythm; no murmurs, rubs, or gallops.  No pedal edema   Data Reviewed: Imaging: I have personally reviewed the CT Chest October 2024 - no pulmonary nodules, no emphysema  PFTs:     Latest Ref Rng & Units 03/08/2023    8:15 AM  PFT Results  FVC-Pre L 4.91   FVC-Predicted Pre % 79   FVC-Post L 5.13   FVC-Predicted Post % 83   Pre FEV1/FVC % % 78   Post FEV1/FCV % % 80   FEV1-Pre L 3.82   FEV1-Predicted Pre % 81   FEV1-Post L 4.10   DLCO uncorrected ml/min/mmHg 27.70   DLCO UNC% % 79   DLCO corrected ml/min/mmHg 27.70   DLCO COR %Predicted % 79   DLVA Predicted % 94   TLC L 6.99   TLC % Predicted % 83   RV %  Predicted % 79    I have personally reviewed the patient's PFTs and normal pft.   Labs: Lab Results  Component Value Date   WBC 8.3 05/03/2019   HGB 16.4 05/03/2019   HCT 48.7 05/03/2019   MCV 98.2 05/03/2019   PLT 317.0 05/03/2019   Lab Results  Component Value Date   NA 139 11/16/2022   K 4.6 11/16/2022   CL 102 11/16/2022   CO2 28 11/16/2022     Immunization status: Immunization History  Administered Date(s) Administered   Influenza,inj,Quad PF,6+ Mos 02/01/2015, 04/05/2018, 03/13/2019   Influenza-Unspecified 04/07/2021   Janssen (J&J) SARS-COV-2 Vaccination 09/25/2019   PFIZER(Purple Top)SARS-COV-2 Vaccination 03/15/2020   Pfizer Covid-19 Vaccine Bivalent Booster 13yrs & up 04/07/2021   Pneumococcal Polysaccharide-23 05/03/2019   Tdap 04/23/2021   Zoster Recombinant(Shingrix) 04/23/2021    External Records Personally Reviewed:   Assessment:  Chronic cough, persistent Tobacco use disorder, ongoing   Plan/Recommendations: Good news - your lung function is normal. But I still want  you to work on quitting smoking! You can stop the Breo since it didn't help.   I think your cough could be most related to post nasal drainage. We will start flonase and I have sent a new anti-histamine (allergy medication) to your pharmacy. You can stop the one you are currently taking ( just make sure they aren't the same or I'll send in something else.)  Flonase - 1 spray on each side of your nose twice a day for first week, then 1 spray on each side.    Return to Care: Return in about 3 months (around 06/17/2023).   Durel Salts, MD Pulmonary and Critical Care Medicine [facility] Office:[phone]

## 2023-04-16 ENCOUNTER — Encounter: Payer: Self-pay | Admitting: "Endocrinology

## 2023-04-16 DIAGNOSIS — E119 Type 2 diabetes mellitus without complications: Secondary | ICD-10-CM | POA: Diagnosis not present

## 2023-04-16 DIAGNOSIS — H5213 Myopia, bilateral: Secondary | ICD-10-CM | POA: Diagnosis not present

## 2023-04-16 DIAGNOSIS — H52203 Unspecified astigmatism, bilateral: Secondary | ICD-10-CM | POA: Diagnosis not present

## 2023-04-16 LAB — HM DIABETES EYE EXAM

## 2023-04-26 ENCOUNTER — Other Ambulatory Visit: Payer: Self-pay | Admitting: Family Medicine

## 2023-04-26 DIAGNOSIS — F5101 Primary insomnia: Secondary | ICD-10-CM

## 2023-04-27 NOTE — Telephone Encounter (Signed)
Refill request for  Trazodone 50 mg LR 10/23/22, #30. 5 rf LOV  02/11/23 FOV  05/14/23  Please review and advise.  Thanks. Dm/cma

## 2023-05-14 ENCOUNTER — Ambulatory Visit: Payer: BC Managed Care – PPO | Admitting: Family Medicine

## 2023-05-18 ENCOUNTER — Other Ambulatory Visit: Payer: Self-pay | Admitting: Family Medicine

## 2023-05-18 DIAGNOSIS — E782 Mixed hyperlipidemia: Secondary | ICD-10-CM

## 2023-05-19 NOTE — Telephone Encounter (Signed)
 Patients wife, Rinaldo Cloud notified and will get this scheduled soon as he knows his work schedule. Dm/cma

## 2023-05-28 ENCOUNTER — Ambulatory Visit: Payer: BC Managed Care – PPO | Admitting: "Endocrinology

## 2023-06-02 ENCOUNTER — Encounter: Payer: Self-pay | Admitting: "Endocrinology

## 2023-06-02 MED ORDER — INSULIN LISPRO (1 UNIT DIAL) 100 UNIT/ML (KWIKPEN): 22.0000 [IU] | PEN_INJECTOR | Freq: Two times a day (BID) | SUBCUTANEOUS | 3 refills | Status: DC

## 2023-06-02 NOTE — Telephone Encounter (Signed)
I called and spoke with the pts wife.  Humalog has been put back on his list and a prescription has been sent.

## 2023-06-08 ENCOUNTER — Other Ambulatory Visit: Payer: Self-pay

## 2023-06-08 MED ORDER — INSULIN LISPRO (1 UNIT DIAL) 100 UNIT/ML (KWIKPEN): 22.0000 [IU] | PEN_INJECTOR | Freq: Two times a day (BID) | SUBCUTANEOUS | 3 refills | Status: AC

## 2023-07-26 ENCOUNTER — Encounter: Payer: Self-pay | Admitting: "Endocrinology

## 2023-07-26 ENCOUNTER — Ambulatory Visit: Payer: BC Managed Care – PPO | Admitting: "Endocrinology

## 2023-07-26 VITALS — BP 130/64 | HR 65 | Ht 77.0 in | Wt 234.0 lb

## 2023-07-26 DIAGNOSIS — E7800 Pure hypercholesterolemia, unspecified: Secondary | ICD-10-CM | POA: Diagnosis not present

## 2023-07-26 DIAGNOSIS — E1065 Type 1 diabetes mellitus with hyperglycemia: Secondary | ICD-10-CM

## 2023-07-26 LAB — POCT GLYCOSYLATED HEMOGLOBIN (HGB A1C): Hemoglobin A1C: 8 % — AB (ref 4.0–5.6)

## 2023-07-26 MED ORDER — TRESIBA FLEXTOUCH 200 UNIT/ML ~~LOC~~ SOPN: 50.0000 [IU] | PEN_INJECTOR | SUBCUTANEOUS | 1 refills | Status: AC

## 2023-07-26 NOTE — Progress Notes (Signed)
 Outpatient Endocrinology Note Alex Colona, MD  07/26/23   Alex Watson 05/30/1965 [id number]  Referring Provider: Loyola Mast, MD Primary Care Provider: Loyola Mast, MD Reason for consultation: Subjective   Assessment & Plan  Diagnoses and all orders for this visit:  Uncontrolled type 1 diabetes mellitus with hyperglycemia (HCC) -     POCT glycosylated hemoglobin (Hb A1C)  Pure hypercholesterolemia  Other orders -     TRESIBA FLEXTOUCH 200 UNIT/ML FlexTouch Pen; Inject 50 Units into the skin daily.   Diabetes Type I complicated by hyperglycemia   Lab Results  Component Value Date   GFR 93.88 11/16/2022   Hba1c goal less than 7, current Hba1c is  Lab Results  Component Value Date   HGBA1C 8.0 (A) 07/26/2023   Tresiba 50 units daily    Lispro 22-24 units around 15 min before supper  (take additional 5 units if BG >200 at bedtime) Instructed to eat 3 equal sized meals rather than 1 big and 2 minor meals   No known contraindications to any of above medications  -Last LD and Tg are as follows: Lab Results  Component Value Date   LDLCALC 55 11/20/2022    Lab Results  Component Value Date   TRIG 64.0 11/20/2022   -On atorvastatin 40 mg every day -Follow low fat diet and exercise   -Blood pressure goal <140/90 - Microalbumin/creatinine goal < 30 -Last MA/Cr is as follows: Lab Results  Component Value Date   MICROALBUR <0.7 11/16/2022   -not on ACE/ARB -diet changes including salt restriction -limit eating outside -counseled BP targets per standards of diabetes care -uncontrolled blood pressure can lead to retinopathy, nephropathy and cardiovascular and atherosclerotic heart disease  Reviewed and counseled on: -A1C target -Blood sugar targets -Complications of uncontrolled diabetes  -Checking blood sugar before meals and bedtime and bring log next visit -All medications with mechanism of action and side effects -Hypoglycemia management:  rule of 15's, Glucagon Emergency Kit and medical alert ID -low-carb low-fat plate-method diet -At least 20 minutes of physical activity per day -Annual dilated retinal eye exam and foot exam -compliance and follow up needs -follow up as scheduled or earlier if problem gets worse  Call if blood sugar is less than 70 or consistently above 250    Take a 15 gm snack of carbohydrate at bedtime before you go to sleep if your blood sugar is less than 100.    If you are going to fast after midnight for a test or procedure, ask your physician for instructions on how to reduce/decrease your insulin dose.    Call if blood sugar is less than 70 or consistently above 250  -Treating a low sugar by rule of 15  (15 gms of sugar every 15 min until sugar is more than 70) If you feel your sugar is low, test your sugar to be sure If your sugar is low (less than 70), then take 15 grams of a fast acting Carbohydrate (3-4 glucose tablets or glucose gel or 4 ounces of juice or regular soda) Recheck your sugar 15 min after treating low to make sure it is more than 70 If sugar is still less than 70, treat again with 15 grams of carbohydrate          Don't drive the hour of hypoglycemia  If unconscious/unable to eat or drink by mouth, use glucagon injection or nasal spray baqsimi and call 911. Can repeat again in 15 min if  still unconscious.  Return in about 4 weeks (around 08/23/2023).   I have reviewed current medications, nurse's notes, allergies, vital signs, past medical and surgical history, family medical history, and social history for this encounter. Counseled patient on symptoms, examination findings, lab findings, imaging results, treatment decisions and monitoring and prognosis. The patient understood the recommendations and agrees with the treatment plan. All questions regarding treatment plan were fully answered.  Alex [place], MD  07/26/23    History of Present Illness Alex Watson is a 58  y.o. year old male who presents for follow up on Type I diabetes mellitus.  Alex Watson was first diagnosed in 2016.   Diabetes education +  Current regimen: Tresiba 52 units daily    Lispro 20-22 units around 15 min before supper   Previous history:  Insulin was started about 3 months after his diagnosis since oral agents failed Appears to be mostly on some form of glargine insulin along with mealtime insulin  COMPLICATIONS -  MI/Stroke -  retinopathy -  neuropathy -  nephropathy  BLOOD SUGAR DATA  CGM interpretation: At today's visit, we reviewed her CGM downloads. The full report is scanned in the media. Reviewing the CGM trends, BG are elevated after supper and overnight with some lows before supper.  Physical Exam  BP 130/64   Pulse 65   Ht 6\' 5"  (1.956 m)   Wt 234 lb (106.1 kg)   SpO2 97%   BMI 27.75 kg/m    Constitutional: well developed, well nourished Head: normocephalic, atraumatic Eyes: sclera anicteric, no redness Neck: supple Lungs: normal respiratory effort Neurology: alert and oriented Skin: dry, no appreciable rashes Musculoskeletal: no appreciable defects Psychiatric: normal mood and affect Diabetic Foot Exam - Simple   No data filed      Current Medications Patient's Medications  New Prescriptions   No medications on file  Previous Medications   ALBUTEROL (VENTOLIN HFA) 108 (90 BASE) MCG/ACT INHALER    Inhale 1-2 puffs into the lungs every 6 (six) hours as needed for wheezing or shortness of breath.   ATORVASTATIN (LIPITOR) 40 MG TABLET    TAKE 1 TABLET(40 MG) BY MOUTH DAILY   BENZONATATE (TESSALON) 200 MG CAPSULE    Take 1 capsule (200 mg total) by mouth 2 (two) times daily as needed for cough.   CONTINUOUS GLUCOSE SENSOR (DEXCOM G7 SENSOR) MISC    Change every 10 days   FLUTICASONE (FLONASE) 50 MCG/ACT NASAL SPRAY    Place 1 spray into both nostrils daily.   INSULIN LISPRO (HUMALOG KWIKPEN) 100 UNIT/ML KWIKPEN    Inject 22-24 Units into  the skin in the morning and at bedtime.   INSULIN SYRINGE-NEEDLE U-100 (B-D INS SYR ULTRAFINE 1CC/30G) 30G X 1/2" 1 ML MISC    USE 1 SYRINGE/NEEDLE DAILY   LEVOCETIRIZINE (XYZAL) 5 MG TABLET    Take 1 tablet (5 mg total) by mouth every evening.   TRAZODONE (DESYREL) 50 MG TABLET    TAKE 1/2 TO 1 TABLET(25 TO 50 MG) BY MOUTH AT BEDTIME AS NEEDED FOR SLEEP  Modified Medications   Modified Medication Previous Medication   TRESIBA FLEXTOUCH 200 UNIT/ML FLEXTOUCH PEN TRESIBA FLEXTOUCH 200 UNIT/ML FlexTouch Pen      Inject 50 Units into the skin daily.    SMARTSIG:54 Unit(s) SUB-Q Daily  Discontinued Medications   No medications on file    Allergies Allergies  Allergen Reactions   Morphine     REACTION: difficulty breathing  Past Medical History Past Medical History:  Diagnosis Date   Allergy    Diabetes mellitus without complication (HCC)    Uncontrolled daytime somnolence 03/07/2013    Past Surgical History Past Surgical History:  Procedure Laterality Date   MANDIBLE SURGERY  1987    Family History family history includes Arthritis in his mother; Asthma in his mother; COPD in his father; Diabetes in his father; Heart disease in his sister; Lupus in his sister.  Social History Social History   Socioeconomic History   Marital status: Married    Spouse name: Not on file   Number of children: 2   Years of education: Not on file   Highest education level: Some college, no degree  Occupational History   Occupation: [employer]    Comment: [employer]  Tobacco Use   Smoking status: Every Day    Current packs/day: 0.50    Average packs/day: 1 pack/day for 34.2 years (33.6 ttl pk-yrs)    Types: Cigarettes    Start date: 2024   Smokeless tobacco: Never  Vaping Use   Vaping status: Never Used  Substance and Sexual Activity   Alcohol use: Yes    Comment: BEER   Drug use: No   Sexual activity: Yes    Comment: number of sex partners in the last 12  months 1  Other Topics Concern   Not on file  Social History Narrative   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)   Social Drivers of Corporate investment banker Strain: Low Risk  (02/10/2023)   Overall Financial Resource Strain (CARDIA)    Difficulty of Paying Living Expenses: Not hard at all  Food Insecurity: No Food Insecurity (02/10/2023)   Hunger Vital Sign    Worried About Running Out of Food in the Last Year: Never true    Ran Out of Food in the Last Year: Never true  Transportation Needs: No Transportation Needs (02/10/2023)   PRAPARE - Administrator, Civil Service (Medical): No    Lack of Transportation (Non-Medical): No  Physical Activity: Unknown (02/10/2023)   Exercise Vital Sign    Days of Exercise per Week: Patient declined    Minutes of Exercise per Session: Not on file  Stress: Stress Concern Present (02/10/2023)   [employer] of Occupational Health - Occupational Stress Questionnaire    Feeling of Stress : To some extent  Social Connections: Moderately Isolated (02/10/2023)   Social Connection and Isolation Panel [NHANES]    Frequency of Communication with Friends and Family: More than three times a week    Frequency of Social Gatherings with Friends and Family: Once a week    Attends Religious Services: Never    Database administrator or Organizations: No    Attends Engineer, structural: Not on file    Marital Status: Married  Intimate Partner Violence: Not on file    Lab Results  Component Value Date   HGBA1C 8.0 (A) 07/26/2023   HGBA1C 8.0 (A) 02/22/2023   HGBA1C 8.2 (H) 11/16/2022   Lab Results  Component Value Date   CHOL 105 11/20/2022   Lab Results  Component Value Date   HDL 37.30 (L) 11/20/2022   Lab Results  Component Value Date   LDLCALC 55 11/20/2022   Lab Results  Component Value Date   TRIG 64.0 11/20/2022   Lab Results  Component Value Date   CHOLHDL 3 11/20/2022   Lab Results  Component Value Date   CREATININE  0.92  11/16/2022   Lab Results  Component Value Date   GFR 93.88 11/16/2022   Lab Results  Component Value Date   MICROALBUR <0.7 11/16/2022      Component Value Date/Time   NA 139 11/16/2022 0927   K 4.6 11/16/2022 0927   CL 102 11/16/2022 0927   CO2 28 11/16/2022 0927   GLUCOSE 70 11/16/2022 0927   BUN 10 11/16/2022 0927   CREATININE 0.92 11/16/2022 0927   CREATININE 0.84 12/16/2015 1138   CALCIUM 9.7 11/16/2022 0927   PROT 6.8 11/20/2022 0855   ALBUMIN 4.4 11/20/2022 0855   AST 16 11/20/2022 0855   ALT 24 11/20/2022 0855   ALKPHOS 87 11/20/2022 0855   BILITOT 0.5 11/20/2022 0855   GFRNONAA >60 01/08/2015 1736   GFRAA >60 01/08/2015 1736      Latest Ref Rng & Units 11/16/2022    9:27 AM 09/04/2022    9:07 AM 11/17/2021    4:55 PM  BMP  Glucose 70 - 99 mg/dL 70  409  811   BUN 6 - 23 mg/dL 10   12   Creatinine 9.14 - 1.50 mg/dL 7.82   9.56   Sodium 213 - 145 mEq/L 139   136   Potassium 3.5 - 5.1 mEq/L 4.6   4.3   Chloride 96 - 112 mEq/L 102   100   CO2 19 - 32 mEq/L 28   29   Calcium 8.4 - 10.5 mg/dL 9.7   9.4        Component Value Date/Time   WBC 8.3 05/03/2019 0833   RBC 4.96 05/03/2019 0833   HGB 16.4 05/03/2019 0833   HCT 48.7 05/03/2019 0833   PLT 317.0 05/03/2019 0833   MCV 98.2 05/03/2019 0833   MCV 92.3 09/30/2017 1546   MCH 31.5 (A) 09/30/2017 1546   MCH 32.6 01/08/2015 1736   MCHC 33.7 05/03/2019 0833   RDW 14.0 05/03/2019 0833   LYMPHSABS 2.5 01/08/2015 1736   MONOABS 0.6 01/08/2015 1736   EOSABS 0.2 01/08/2015 1736   BASOSABS 0.0 01/08/2015 1736     Parts of this note may have been dictated using voice recognition software. There may be variances in spelling and vocabulary which are unintentional. Not all errors are proofread. Please notify the Thereasa Parkin if any discrepancies are noted or if the meaning of any statement is not clear.

## 2023-07-30 ENCOUNTER — Encounter: Payer: Self-pay | Admitting: "Endocrinology

## 2023-08-20 ENCOUNTER — Ambulatory Visit: Admitting: "Endocrinology

## 2023-08-31 ENCOUNTER — Ambulatory Visit: Admitting: Family Medicine

## 2023-08-31 VITALS — BP 118/70 | HR 78 | Temp 98.4°F | Ht 77.0 in | Wt 232.6 lb

## 2023-08-31 DIAGNOSIS — M5442 Lumbago with sciatica, left side: Secondary | ICD-10-CM

## 2023-08-31 DIAGNOSIS — M5441 Lumbago with sciatica, right side: Secondary | ICD-10-CM

## 2023-08-31 MED ORDER — NAPROXEN 500 MG PO TABS
500.0000 mg | ORAL_TABLET | Freq: Two times a day (BID) | ORAL | 0 refills | Status: AC
Start: 2023-08-31 — End: ?

## 2023-08-31 NOTE — Progress Notes (Signed)
 [facility] [address] [place] [address] Dept: [phone] Dept Fax: [phone]  Office Visit  Subjective:    Patient ID: Alex Watson, male    DOB: 06-Mar-1966, 58 y.o..   MRN: [id number]  Chief Complaint  Patient presents with   Back Pain    C/o having lower back pain x 2-3 months.  Has been taking Ibuprofen .     History of Present Illness:  Patient is in today complaining of a 3-45 month history of lower back pain. He has a past history of low back pain from about 7 years ago after starting a new exercise routine. He cannot identify a trigger for the current back issues. He notes the pain waxes and wanes. He is having some radiation into the anterior thighs. He finds getting up from prolonged sitting bothers this quite a bit. He has been taking ibuprofen  400 mg about every 6 hours without relief. He is not having numbness, weakness, or tingling in the legs  Past Medical History: Patient Active Problem List   Diagnosis Date Noted   Ascending aortic aneurysm (HCC) 02/22/2023   Aortic atherosclerosis (HCC) 02/22/2023   Adrenal adenoma, left 02/22/2023   COPD (chronic obstructive pulmonary disease) case management patient (HCC) 12/04/2022   Chronic cough 08/28/2022   Tobacco use 04/23/2021   Hyperlipidemia 04/23/2021   Warts 04/23/2021   Osteoarthritis of spine with radiculopathy, cervical region 11/15/2015   Subarachnoid bleed (HCC) 01/04/2015   Cerebral contusion and laceration with loss of consciousness (HCC) 01/04/2015   Type 2 diabetes mellitus without complication (HCC) 08/18/2014   Past Surgical History:  Procedure Laterality Date   MANDIBLE SURGERY  1987   Family History  Problem Relation Age of Onset   Asthma Mother    Arthritis Mother    COPD Father    Diabetes Father    Heart disease Sister        eisenberger syndrome   Lupus Sister    Outpatient Medications Prior to Visit  Medication Sig  Dispense Refill   albuterol  (VENTOLIN  HFA) 108 (90 Base) MCG/ACT inhaler Inhale 1-2 puffs into the lungs every 6 (six) hours as needed for wheezing or shortness of breath. 8 g 2   atorvastatin  (LIPITOR) 40 MG tablet TAKE 1 TABLET(40 MG) BY MOUTH DAILY 90 tablet 3   Continuous Glucose Sensor (DEXCOM G7 SENSOR) MISC Change every 10 days 9 each 2   insulin  lispro (HUMALOG  KWIKPEN) 100 UNIT/ML KwikPen Inject 22-24 Units into the skin in the morning and at bedtime. 45 mL 3   Insulin  Syringe-Needle U-100 (B-D INS SYR ULTRAFINE 1CC/30G) 30G X 1/2" 1 ML MISC USE 1 SYRINGE/NEEDLE DAILY 30 each 0   traZODone  (DESYREL ) 50 MG tablet TAKE 1/2 TO 1 TABLET(25 TO 50 MG) BY MOUTH AT BEDTIME AS NEEDED FOR SLEEP 30 tablet 5   TRESIBA  FLEXTOUCH 200 UNIT/ML FlexTouch Pen Inject 50 Units into the skin daily. 24 mL 1   fluticasone  (FLONASE ) 50 MCG/ACT nasal spray Place 1 spray into both nostrils daily. 16 g 3   benzonatate  (TESSALON ) 200 MG capsule Take 1 capsule (200 mg total) by mouth 2 (two) times daily as needed for cough. 20 capsule 0   levocetirizine (XYZAL ) 5 MG tablet Take 1 tablet (5 mg total) by mouth every evening. 30 tablet 5   No facility-administered medications prior to visit.   Allergies  Allergen Reactions   Morphine     REACTION: difficulty breathing  Objective:   Today's Vitals   08/31/23 1401  BP: 118/70  Pulse: 78  Temp: 98.4 F (36.9 C)  TempSrc: Temporal  SpO2: 97%  Weight: 232 lb 9.6 oz (105.5 kg)  Height: 6\' 5"  (1.956 m)   Body mass index is 27.58 kg/m.   General: Well developed, well nourished. No acute distress. Back: Straight. Pain indicated in a band across the lower lumbar area. Extremities: LE with full ROM. SLR -. Strength 5/5. Neuro: LE sensation normal. DTR 2+ bilaterally. Psych: Alert and oriented. Normal mood and affect.  Health Maintenance Due  Topic Date Due   HIV Screening  Never done   Hepatitis C Screening  Never done   Colonoscopy  05/14/2019    Pneumococcal Vaccine 81-110 Years old (2 of 2 - PCV) 05/02/2020   Zoster Vaccines- Shingrix (2 of 2) 06/18/2021     Assessment & Plan:   Problem List Items Addressed This Visit   None Visit Diagnoses       Acute bilateral low back pain with bilateral sciatica    -  Primary   I will prescribe naproxen  500 mg bid for 14 days. I will refer him to PT. Recommend heat and stretches. Follow-up if these interventions are not helping.   Relevant Medications   naproxen  (NAPROSYN ) 500 MG tablet   Other Relevant Orders   Ambulatory referral to Physical Therapy       Return in about 3 months (around 11/30/2023) for Reassessment.   Graig Lawyer, MD

## 2023-09-01 ENCOUNTER — Ambulatory Visit: Admitting: "Endocrinology

## 2023-09-01 ENCOUNTER — Encounter: Payer: Self-pay | Admitting: "Endocrinology

## 2023-09-01 VITALS — BP 130/80 | HR 82 | Ht 77.0 in | Wt 234.0 lb

## 2023-09-01 DIAGNOSIS — E7800 Pure hypercholesterolemia, unspecified: Secondary | ICD-10-CM

## 2023-09-01 DIAGNOSIS — E1065 Type 1 diabetes mellitus with hyperglycemia: Secondary | ICD-10-CM

## 2023-09-01 NOTE — Patient Instructions (Signed)

## 2023-09-01 NOTE — Progress Notes (Signed)
 Outpatient Endocrinology Note Jorge Newcomer, MD  09/01/23   Alex Watson August 19, 1965 [id number]  Referring Provider: Graig Lawyer, MD Primary Care Provider: Graig Lawyer, MD Reason for consultation: Subjective   Assessment & Plan  Diagnoses and all orders for this visit:  Uncontrolled type 1 diabetes mellitus with hyperglycemia (HCC)  Pure hypercholesterolemia    Diabetes Type I complicated by hyperglycemia   Lab Results  Component Value Date   GFR 93.88 11/16/2022   Hba1c goal less than 7, current Hba1c is  Lab Results  Component Value Date   HGBA1C 8.0 (A) 07/26/2023   Tresiba  46 units daily    Lispro 22-24 units around 15 min before supper  (take additional 5 units if BG >200 at bedtime) Correction scale: Use in addition to your meal time/short acting insulin  based on blood sugars as follows:  151 - 190: 1 unit 191 - 230: 2 units 231 - 270: 3 units 271 - 310: 4 units 311 - 350: 5 units 351 - 390: 6 units 391 - 430: 7 units  Instructed to eat 3 equal sized meals rather than 1 big and 2 minor meals -patient continues to eat biggest supper meal leading to hyperglycemia 4 hours after the meal.  No known contraindications to any of above medications  -Last LD and Tg are as follows: Lab Results  Component Value Date   LDLCALC 55 11/20/2022    Lab Results  Component Value Date   TRIG 64.0 11/20/2022   -On atorvastatin  40 mg every day -Follow low fat diet and exercise   -Blood pressure goal <140/90 - Microalbumin/creatinine goal < 30 -Last MA/Cr is as follows: Lab Results  Component Value Date   MICROALBUR <0.7 11/16/2022   -not on ACE/ARB -diet changes including salt restriction -limit eating outside -counseled BP targets per standards of diabetes care -uncontrolled blood pressure can lead to retinopathy, nephropathy and cardiovascular and atherosclerotic heart disease  Reviewed and counseled on: -A1C target -Blood sugar  targets -Complications of uncontrolled diabetes  -Checking blood sugar before meals and bedtime and bring log next visit -All medications with mechanism of action and side effects -Hypoglycemia management: rule of 15's, Glucagon Emergency Kit and medical alert ID -low-carb low-fat plate-method diet -At least 20 minutes of physical activity per day -Annual dilated retinal eye exam and foot exam -compliance and follow up needs -follow up as scheduled or earlier if problem gets worse  Call if blood sugar is less than 70 or consistently above 250    Take a 15 gm snack of carbohydrate at bedtime before you go to sleep if your blood sugar is less than 100.    If you are going to fast after midnight for a test or procedure, ask your physician for instructions on how to reduce/decrease your insulin  dose.    Call if blood sugar is less than 70 or consistently above 250  -Treating a low sugar by rule of 15  (15 gms of sugar every 15 min until sugar is more than 70) If you feel your sugar is low, test your sugar to be sure If your sugar is low (less than 70), then take 15 grams of a fast acting Carbohydrate (3-4 glucose tablets or glucose gel or 4 ounces of juice or regular soda) Recheck your sugar 15 min after treating low to make sure it is more than 70 If sugar is still less than 70, treat again with 15 grams of carbohydrate  Don't drive the hour of hypoglycemia  If unconscious/unable to eat or drink by mouth, use glucagon injection or nasal spray baqsimi and call 911. Can repeat again in 15 min if still unconscious.  Return in about 3 months (around 12/01/2023).   I have reviewed current medications, nurse's notes, allergies, vital signs, past medical and surgical history, family medical history, and social history for this encounter. Counseled patient on symptoms, examination findings, lab findings, imaging results, treatment decisions and monitoring and prognosis. The patient  understood the recommendations and agrees with the treatment plan. All questions regarding treatment plan were fully answered.  Jorge Newcomer, MD  09/01/23    History of Present Illness Alex Watson is a 58 y.o. year old male who presents for follow up on Type I diabetes mellitus.  Alex Watson was first diagnosed in 2016.   Diabetes education +  Current regimen: Tresiba  50 units daily    Lispro 20-22 units around 15 min before supper   Previous history:  Insulin  was started about 3 months after his diagnosis since oral agents failed Appears to be mostly on some form of glargine insulin  along with mealtime insulin   COMPLICATIONS -  MI/Stroke -  retinopathy -  neuropathy -  nephropathy  BLOOD SUGAR DATA  CGM interpretation: At today's visit, we reviewed her CGM downloads. The full report is scanned in the media. Reviewing the CGM trends, BG are elevated overnight when patient is sleeping, and low in the evening.  Physical Exam  BP 130/80   Pulse 82   Ht 6\' 5"  (1.956 m)   Wt 234 lb (106.1 kg)   SpO2 98%   BMI 27.75 kg/m    Constitutional: well developed, well nourished Head: normocephalic, atraumatic Eyes: sclera anicteric, no redness Neck: supple Lungs: normal respiratory effort Neurology: alert and oriented Skin: dry, no appreciable rashes Musculoskeletal: no appreciable defects Psychiatric: normal mood and affect Diabetic Foot Exam - Simple   No data filed      Current Medications Patient's Medications  New Prescriptions   No medications on file  Previous Medications   ALBUTEROL  (VENTOLIN  HFA) 108 (90 BASE) MCG/ACT INHALER    Inhale 1-2 puffs into the lungs every 6 (six) hours as needed for wheezing or shortness of breath.   ATORVASTATIN  (LIPITOR) 40 MG TABLET    TAKE 1 TABLET(40 MG) BY MOUTH DAILY   CONTINUOUS GLUCOSE SENSOR (DEXCOM G7 SENSOR) MISC    Change every 10 days   INSULIN  LISPRO (HUMALOG  KWIKPEN) 100 UNIT/ML KWIKPEN    Inject 22-24 Units  into the skin in the morning and at bedtime.   INSULIN  SYRINGE-NEEDLE U-100 (B-D INS SYR ULTRAFINE 1CC/30G) 30G X 1/2" 1 ML MISC    USE 1 SYRINGE/NEEDLE DAILY   NAPROXEN  (NAPROSYN ) 500 MG TABLET    Take 1 tablet (500 mg total) by mouth 2 (two) times daily with a meal.   TRAZODONE  (DESYREL ) 50 MG TABLET    TAKE 1/2 TO 1 TABLET(25 TO 50 MG) BY MOUTH AT BEDTIME AS NEEDED FOR SLEEP   TRESIBA  FLEXTOUCH 200 UNIT/ML FLEXTOUCH PEN    Inject 50 Units into the skin daily.  Modified Medications   No medications on file  Discontinued Medications   No medications on file    Allergies Allergies  Allergen Reactions   Morphine     REACTION: difficulty breathing    Past Medical History Past Medical History:  Diagnosis Date   Allergy    Diabetes mellitus without complication (HCC)  Uncontrolled daytime somnolence 03/07/2013    Past Surgical History Past Surgical History:  Procedure Laterality Date   MANDIBLE SURGERY  1987    Family History family history includes Arthritis in his mother; Asthma in his mother; COPD in his father; Diabetes in his father; Heart disease in his sister; Lupus in his sister.  Social History Social History   Socioeconomic History   Marital status: Married    Spouse name: Not on file   Number of children: 2   Years of education: Not on file   Highest education level: Associate degree: occupational, Scientist, product/process development, or vocational program  Occupational History   Occupation: [employer]- [employer]    Comment: [employer]  Tobacco Use   Smoking status: Every Day    Current packs/day: 0.50    Average packs/day: 1 pack/day for 34.3 years (33.7 ttl pk-yrs)    Types: Cigarettes    Start date: 2024   Smokeless tobacco: Never  Vaping Use   Vaping status: Never Used  Substance and Sexual Activity   Alcohol use: Yes    Comment: BEER   Drug use: No   Sexual activity: Yes    Comment: number of sex partners in the last 12 months 1  Other Topics Concern    Not on file  Social History Narrative   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)   Social Drivers of Corporate investment banker Strain: Low Risk  (08/31/2023)   Overall Financial Resource Strain (CARDIA)    Difficulty of Paying Living Expenses: Not hard at all  Food Insecurity: No Food Insecurity (08/31/2023)   Hunger Vital Sign    Worried About Running Out of Food in the Last Year: Never true    Ran Out of Food in the Last Year: Never true  Transportation Needs: No Transportation Needs (08/31/2023)   PRAPARE - Administrator, Civil Service (Medical): No    Lack of Transportation (Non-Medical): No  Physical Activity: Unknown (08/31/2023)   Exercise Vital Sign    Days of Exercise per Week: 2 days    Minutes of Exercise per Session: Patient declined  Stress: No Stress Concern Present (08/31/2023)   [employer] of Occupational Health - Occupational Stress Questionnaire    Feeling of Stress : Only a little  Social Connections: Moderately Isolated (08/31/2023)   Social Connection and Isolation Panel [NHANES]    Frequency of Communication with Friends and Family: More than three times a week    Frequency of Social Gatherings with Friends and Family: Once a week    Attends Religious Services: Never    Database administrator or Organizations: No    Attends Engineer, structural: Not on file    Marital Status: Married  Intimate Partner Violence: Not on file    Lab Results  Component Value Date   HGBA1C 8.0 (A) 07/26/2023   HGBA1C 8.0 (A) 02/22/2023   HGBA1C 8.2 (H) 11/16/2022   Lab Results  Component Value Date   CHOL 105 11/20/2022   Lab Results  Component Value Date   HDL 37.30 (L) 11/20/2022   Lab Results  Component Value Date   LDLCALC 55 11/20/2022   Lab Results  Component Value Date   TRIG 64.0 11/20/2022   Lab Results  Component Value Date   CHOLHDL 3 11/20/2022   Lab Results  Component Value Date   CREATININE 0.92 11/16/2022   Lab Results  Component  Value Date   GFR 93.88 11/16/2022   Lab Results  Component Value Date   MICROALBUR <0.7 11/16/2022      Component Value Date/Time   NA 139 11/16/2022 0927   K 4.6 11/16/2022 0927   CL 102 11/16/2022 0927   CO2 28 11/16/2022 0927   GLUCOSE 70 11/16/2022 0927   BUN 10 11/16/2022 0927   CREATININE 0.92 11/16/2022 0927   CREATININE 0.84 12/16/2015 1138   CALCIUM  9.7 11/16/2022 0927   PROT 6.8 11/20/2022 0855   ALBUMIN 4.4 11/20/2022 0855   AST 16 11/20/2022 0855   ALT 24 11/20/2022 0855   ALKPHOS 87 11/20/2022 0855   BILITOT 0.5 11/20/2022 0855   GFRNONAA >60 01/08/2015 1736   GFRAA >60 01/08/2015 1736      Latest Ref Rng & Units 11/16/2022    9:27 AM 09/04/2022    9:07 AM 11/17/2021    4:55 PM  BMP  Glucose 70 - 99 mg/dL 70  161  096   BUN 6 - 23 mg/dL 10   12   Creatinine 0.45 - 1.50 mg/dL 4.09   8.11   Sodium 914 - 145 mEq/L 139   136   Potassium 3.5 - 5.1 mEq/L 4.6   4.3   Chloride 96 - 112 mEq/L 102   100   CO2 19 - 32 mEq/L 28   29   Calcium  8.4 - 10.5 mg/dL 9.7   9.4        Component Value Date/Time   WBC 8.3 05/03/2019 0833   RBC 4.96 05/03/2019 0833   HGB 16.4 05/03/2019 0833   HCT 48.7 05/03/2019 0833   PLT 317.0 05/03/2019 0833   MCV 98.2 05/03/2019 0833   MCV 92.3 09/30/2017 1546   MCH 31.5 (A) 09/30/2017 1546   MCH 32.6 01/08/2015 1736   MCHC 33.7 05/03/2019 0833   RDW 14.0 05/03/2019 0833   LYMPHSABS 2.5 01/08/2015 1736   MONOABS 0.6 01/08/2015 1736   EOSABS 0.2 01/08/2015 1736   BASOSABS 0.0 01/08/2015 1736     Parts of this note may have been dictated using voice recognition software. There may be variances in spelling and vocabulary which are unintentional. Not all errors are proofread. Please notify the [facility] if any discrepancies are noted or if the meaning of any statement is not clear.

## 2023-09-02 ENCOUNTER — Encounter: Payer: Self-pay | Admitting: "Endocrinology

## 2023-09-20 ENCOUNTER — Encounter: Payer: Self-pay | Admitting: Physical Therapy

## 2023-09-20 ENCOUNTER — Other Ambulatory Visit: Payer: Self-pay

## 2023-09-20 ENCOUNTER — Ambulatory Visit: Attending: Family Medicine | Admitting: Physical Therapy

## 2023-09-20 DIAGNOSIS — M5441 Lumbago with sciatica, right side: Secondary | ICD-10-CM | POA: Insufficient documentation

## 2023-09-20 DIAGNOSIS — M5442 Lumbago with sciatica, left side: Secondary | ICD-10-CM | POA: Diagnosis not present

## 2023-09-20 DIAGNOSIS — R29898 Other symptoms and signs involving the musculoskeletal system: Secondary | ICD-10-CM | POA: Diagnosis not present

## 2023-09-20 DIAGNOSIS — M5459 Other low back pain: Secondary | ICD-10-CM | POA: Insufficient documentation

## 2023-09-20 NOTE — Therapy (Signed)
 OUTPATIENT PHYSICAL THERAPY THORACOLUMBAR EVALUATION   Patient Name: Alex Watson MRN: [id number] DOB:September 04, 1965, 58 y.o., male Today's Date: 09/20/2023  END OF SESSION:  PT End of Session - 09/20/23 1148     Visit Number 1    Number of Visits 7    Date for PT Re-Evaluation 11/01/23    Authorization Type [employer] Comm PPO    Authorization Time Period 09/20/23 to 11/01/23    PT Start Time 1147    PT Stop Time 1227    PT Time Calculation (min) 40 min    Activity Tolerance Patient tolerated treatment well    Behavior During Therapy [facility] for tasks assessed/performed             Past Medical History:  Diagnosis Date   Allergy    Diabetes mellitus without complication (HCC)    Uncontrolled daytime somnolence 03/07/2013   Past Surgical History:  Procedure Laterality Date   MANDIBLE SURGERY  1987   Patient Active Problem List   Diagnosis Date Noted   Ascending aortic aneurysm (HCC) 02/22/2023   Aortic atherosclerosis (HCC) 02/22/2023   Adrenal adenoma, left 02/22/2023   COPD (chronic obstructive pulmonary disease) case management patient (HCC) 12/04/2022   Chronic cough 08/28/2022   Tobacco use 04/23/2021   Hyperlipidemia 04/23/2021   Warts 04/23/2021   Osteoarthritis of spine with radiculopathy, cervical region 11/15/2015   Subarachnoid bleed (HCC) 01/04/2015   Cerebral contusion and laceration with loss of consciousness (HCC) 01/04/2015   Type 2 diabetes mellitus without complication (HCC) 08/18/2014    PCP: Graig Lawyer, MD  REFERRING PROVIDER: Graig Lawyer, MD  REFERRING DIAG: [phone] (ICD-10-CM) - Acute bilateral low back pain with bilateral sciatica  Rationale for Evaluation and Treatment: Rehabilitation  THERAPY DIAG:  Other low back pain  Other symptoms and signs involving the musculoskeletal system  ONSET DATE: about 3 months ago   SUBJECTIVE:                                                                                                                                                                                            SUBJECTIVE STATEMENT:  Back feels fine until I go to stand up, its been going on for about 3 months. Its worse in the morning then gets better through the day. If I sit for any length of time it will flare up. Has been staying the same, no change with Ibuprofen  and Dr. Therese Flash eventually sent me to PT. Get some numbness down front of L leg 1-2 times a week, lasts about an hour then goes away.   PERTINENT HISTORY:  See above  PAIN:  Are you having pain? No 0/10 "just sore"   PRECAUTIONS: None  RED FLAGS: None   WEIGHT BEARING RESTRICTIONS: No  FALLS:  Has patient fallen in last 6 months? Yes. 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    PATIENT SURVEYS:  Modified Oswestry 2/50    COGNITION: Overall cognitive status: Within functional limits for tasks assessed     SENSATION: Not tested  MUSCLE LENGTH: Hamstrings WNL  Piriformis WNL   POSTURE: rounded shoulders and forward head    LUMBAR ROM:   AROM eval  Flexion WNL, RFIS sore but at baseline   Extension Mild lmpairment, REIS no change   Right lateral flexion Mild limitation   Left lateral flexion Mild limitation   Right rotation Moderate limitation   Left rotation Moderate limitation    (Blank rows = not tested)    LOWER EXTREMITY MMT:    MMT Right eval Left eval  Hip flexion 5 5  Hip extension    Hip abduction 5 5  Hip adduction    Hip internal rotation    Hip external  rotation    Knee flexion 5 5  Knee extension 5 5  Ankle dorsiflexion    Ankle plantarflexion    Ankle inversion    Ankle eversion     (Blank rows = not tested)    TREATMENT DATE:   09/20/23  Eval, POC, HEP   Nustep L5x6 minutes BLEs only for w/u and tissue perfusion HEP practice of below                                                                                                                                    PATIENT EDUCATION:  Education details: exam findings, HEP, POC  Person educated: Patient Education method: Explanation, Demonstration, and Handouts Education comprehension: verbalized understanding  HOME EXERCISE PROGRAM: Access Code: [email or web address] URL: [email or web address] Date: 09/20/2023 Prepared by: Terrel Ferries  Exercises - Supine Lower Trunk Rotation  - 2 x daily - 7 x weekly - 1 sets - 10 reps - 5 seconds  hold - Supine Double Knee to Chest  - 2 x daily - 7 x weekly - 1 sets - 10 reps - 5 seconds  hold - Supine Posterior Pelvic Tilt  - 1 x daily - 7 x weekly - 2 sets - 10 reps - Small Range SLR + Posterior Pelvic Tilt   - 1 x daily - 7 x weekly - 2 sets - 10 reps  ASSESSMENT:  CLINICAL IMPRESSION: Patient is a 58 y.o. M who was seen today for physical therapy evaluation and treatment for M54.42,M54.41 (ICD-10-CM) - Acute bilateral low back pain with bilateral sciatica. I think that a lot of his pain is related to immobility in the joints and musculature of his lumbar spine, as well as faulty biomechanics. He does have a busy travel schedule related to work, but I think he will benefit from skilled PT services as his case is pretty straight forward musculoskeletally.   OBJECTIVE IMPAIRMENTS: decreased ROM, decreased strength, increased fascial restrictions, increased muscle spasms, impaired flexibility, postural dysfunction, and pain.   ACTIVITY LIMITATIONS: sitting and standing  PARTICIPATION LIMITATIONS: driving, community activity,  occupation, and yard work  PERSONAL FACTORS: Behavior pattern, Fitness, Profession, and Time since onset of injury/illness/exacerbation are also affecting patient's functional outcome.   REHAB POTENTIAL: Good  CLINICAL DECISION MAKING: Stable/uncomplicated  EVALUATION COMPLEXITY: Low   GOALS: Goals reviewed with patient? No  SHORT TERM GOALS: Target date: 10/11/2023    Will be compliant with appropriate progressive HEP GOAL STATUS: Initial   2. Will demonstrate improved postural awareness with all functional tasks, use of ergonomic aides PRN/as desired GOAL STATUS: Initial   3. Will demonstrate good functional biomechanics for bed mobility and floor to waist lifting mechanics   GOAL STATUS: Initial  4. Lumbar AROM to have normalized  GOAL STATUS: Initial   LONG TERM GOALS: Target date: 11/01/2023    MMT to have improved by one grade all weak groups GOAL STATUS: Initial  2. Mm flexibility and spasms to have improved by at least 50% in order to improve functional movement patterns and for pain control GOAL STATUS: Initial  3. Pain to be no more than 2/10 with all functional activities and after getting up from extended sitting activities  GOAL STATUS: Initial   4. Will be able to travel as required for work/as desired without increase in symptoms from baseline or numbness L thigh  GOAL STATUS: Initial  PLAN:  PT FREQUENCY: 1-2x/week  PT DURATION: 6 weeks  PLANNED INTERVENTIONS: 97750- Physical Performance Testing, 97110-Therapeutic exercises, 97530- Therapeutic activity, [id number]- Neuromuscular re-education, 97535- Self Care, and [id number]- Manual therapy.  PLAN FOR NEXT SESSION: core strength, biomechanics, lumbar mobility   Terrel Ferries, PT, DPT 09/20/23 1:10 PM

## 2023-10-01 ENCOUNTER — Ambulatory Visit: Admitting: Physical Therapy

## 2023-10-01 DIAGNOSIS — M5442 Lumbago with sciatica, left side: Secondary | ICD-10-CM | POA: Diagnosis not present

## 2023-10-01 DIAGNOSIS — M5459 Other low back pain: Secondary | ICD-10-CM | POA: Diagnosis not present

## 2023-10-01 DIAGNOSIS — M5441 Lumbago with sciatica, right side: Secondary | ICD-10-CM | POA: Diagnosis not present

## 2023-10-01 DIAGNOSIS — R29898 Other symptoms and signs involving the musculoskeletal system: Secondary | ICD-10-CM | POA: Diagnosis not present

## 2023-10-01 NOTE — Therapy (Signed)
 OUTPATIENT PHYSICAL THERAPY THORACOLUMBAR    Patient Name: Alex Watson MRN: [id number] DOB:1965-06-23, 58 y.o., male Today's Date: 10/01/2023  END OF SESSION:  PT End of Session - 10/01/23 0749     Visit Number 2    Number of Visits 7    Date for PT Re-Evaluation 11/01/23    Authorization Type [employer] PPO    Authorization Time Period 09/20/23 to 11/01/23    PT Start Time 0750    PT Stop Time 0835    PT Time Calculation (min) 45 min             Past Medical History:  Diagnosis Date   Allergy    Diabetes mellitus without complication (HCC)    Uncontrolled daytime somnolence 03/07/2013   Past Surgical History:  Procedure Laterality Date   MANDIBLE SURGERY  1987   Patient Active Problem List   Diagnosis Date Noted   Ascending aortic aneurysm (HCC) 02/22/2023   Aortic atherosclerosis (HCC) 02/22/2023   Adrenal adenoma, left 02/22/2023   COPD (chronic obstructive pulmonary disease) case management patient (HCC) 12/04/2022   Chronic cough 08/28/2022   Tobacco use 04/23/2021   Hyperlipidemia 04/23/2021   Warts 04/23/2021   Osteoarthritis of spine with radiculopathy, cervical region 11/15/2015   Subarachnoid bleed (HCC) 01/04/2015   Cerebral contusion and laceration with loss of consciousness (HCC) 01/04/2015   Type 2 diabetes mellitus without complication (HCC) 08/18/2014    PCP: Graig Lawyer, MD  REFERRING PROVIDER: Graig Lawyer, MD  REFERRING DIAG: [phone] (ICD-10-CM) - Acute bilateral low back pain with bilateral sciatica  Rationale for Evaluation and Treatment: Rehabilitation  THERAPY DIAG:  Other low back pain  ONSET DATE: about 3 months ago   SUBJECTIVE:                                                                                                                                                                                           SUBJECTIVE STATEMENT: feeling better, not as severe. Radiating pain is infrequent now. HEP is  helping    Back feels fine until I go to stand up, its been going on for about 3 months. Its worse in the morning then gets better through the day. If I sit for any length of time it will flare up. Has been staying the same, no change with Ibuprofen  and Dr. Therese Flash eventually sent me to PT. Get some numbness down front of L leg 1-2 times a week, lasts about an hour then goes away.   PERTINENT HISTORY:  See above  PAIN:  Are you having pain? No 0/10 "just sore"   PRECAUTIONS:  None  RED FLAGS: None   WEIGHT BEARING RESTRICTIONS: No  FALLS:  Has patient fallen in last 6 months? Yes. 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    PATIENT SURVEYS:  Modified Oswestry 2/50    COGNITION: Overall cognitive status: Within functional limits for tasks assessed     SENSATION: Not tested  MUSCLE LENGTH: Hamstrings WNL  Piriformis WNL   POSTURE: rounded shoulders and forward head    LUMBAR ROM:   AROM eval  Flexion WNL, RFIS sore but at baseline   Extension Mild lmpairment, REIS no change   Right lateral flexion Mild limitation   Left lateral flexion Mild limitation   Right rotation Moderate limitation   Left rotation Moderate limitation    (Blank rows = not tested)    LOWER EXTREMITY MMT:    MMT Right eval Left eval  Hip flexion 5 5  Hip extension    Hip abduction 5 5  Hip adduction    Hip internal rotation    Hip external rotation    Knee flexion 5 5  Knee extension 5 5  Ankle dorsiflexion    Ankle plantarflexion     Ankle inversion    Ankle eversion     (Blank rows = not tested)    TREATMENT DATE:   10/01/23 Nustep L5x6 minutes BLEs only for w/u and tissue perfusion Sitting on dyna disc pelvis ROM 4 way 10 x each,LE core stab with LAQ.hip flex and abd, postural stab with green tband shld row and ext 15 x Supine feet on ball bridge,KTC,obl 2 sets 10 Supine isometric abdominals 15 x hold 3 sec 6 inch step up with opp leg ext 10 x BIL with UE support then laterally with opp leg abd PROM LE and trunk  Lumbar traction 75# 15 min   09/20/23  Eval, POC, HEP   Nustep L5x6 minutes BLEs only for w/u and tissue perfusion HEP practice of below                                                                                                                                    PATIENT EDUCATION:  Education details: exam findings, HEP, POC  Person educated: Patient Education method: Explanation, Demonstration, and Handouts Education comprehension: verbalized understanding  HOME EXERCISE PROGRAM: Access Code: [email or web address] URL: [email or web address] Date: 09/20/2023 Prepared by: Terrel Ferries  Exercises - Supine Lower Trunk Rotation  - 2 x daily - 7 x weekly - 1 sets - 10 reps - 5  seconds  hold - Supine Double Knee to Chest  - 2 x daily - 7 x weekly - 1 sets - 10 reps - 5 seconds  hold - Supine Posterior Pelvic Tilt  - 1 x daily - 7 x weekly - 2 sets - 10 reps - Small Range SLR + Posterior Pelvic Tilt   - 1 x daily - 7 x weekly - 2 sets - 10 reps  ASSESSMENT:  CLINICAL IMPRESSION:  pt arrived feeling better and doing HEP. Initiated core stab ex and pelvic mobility. Pt needed cuing but tolerated well. Chief complaint was tightness so tried mech traction and PROM to help stretch and loosen.    Patient is a 58 y.o. M who was seen today for physical therapy evaluation and treatment for M54.42,M54.41 (ICD-10-CM) - Acute bilateral low back pain with bilateral sciatica. I think that a lot of  his pain is related to immobility in the joints and musculature of his lumbar spine, as well as faulty biomechanics. He does have a busy travel schedule related to work, but I think he will benefit from skilled PT services as his case is pretty straight forward musculoskeletally.   OBJECTIVE IMPAIRMENTS: decreased ROM, decreased strength, increased fascial restrictions, increased muscle spasms, impaired flexibility, postural dysfunction, and pain.   ACTIVITY LIMITATIONS: sitting and standing  PARTICIPATION LIMITATIONS: driving, community activity, occupation, and yard work  PERSONAL FACTORS: Behavior pattern, Fitness, Profession, and Time since onset of injury/illness/exacerbation are also affecting patient's functional outcome.   REHAB POTENTIAL: Good  CLINICAL DECISION MAKING: Stable/uncomplicated  EVALUATION COMPLEXITY: Low   GOALS: Goals reviewed with patient? No  SHORT TERM GOALS: Target date: 10/11/2023    Will be compliant with appropriate progressive HEP GOAL STATUS: Initial   2. Will demonstrate improved postural awareness with all functional tasks, use of ergonomic aides PRN/as desired GOAL STATUS: Initial   3. Will demonstrate good functional biomechanics for bed mobility and floor to waist lifting mechanics   GOAL STATUS: Initial  4. Lumbar AROM to have normalized  GOAL STATUS: Initial   LONG TERM GOALS: Target date: 11/01/2023    MMT to have improved by one grade all weak groups GOAL STATUS: Initial  2. Mm flexibility and spasms to have improved by at least 50% in order to improve functional movement patterns and for pain control GOAL STATUS: Initial  3. Pain to be no more than 2/10 with all functional activities and after getting up from extended sitting activities  GOAL STATUS: Initial   4. Will be able to travel as required for work/as desired without increase in symptoms from baseline or numbness L thigh  GOAL STATUS: Initial  PLAN:  PT FREQUENCY:  1-2x/week  PT DURATION: 6 weeks  PLANNED INTERVENTIONS: 97750- Physical Performance Testing, 97110-Therapeutic exercises, 97530- Therapeutic activity, [id number]- Neuromuscular re-education, 97535- Self Care, and 96045- Manual therapy.  PLAN FOR NEXT SESSION: assess traction.core strength, biomechanics, lumbar mobility    Patient Details  Name: DEADRICK STIDD MRN: [id number] Date of Birth: [birth year] Referring Provider:  Graig Lawyer, MD  Encounter Date: 10/01/2023   Aquilla Bayley, PTA 10/01/2023, 8:23 AM  [place] [facility] at [facility] [address] [place], [place], [address] Phone: [phone]   Fax:  [phone]

## 2023-10-08 ENCOUNTER — Ambulatory Visit: Admitting: Physical Therapy

## 2023-10-08 DIAGNOSIS — M5459 Other low back pain: Secondary | ICD-10-CM | POA: Diagnosis not present

## 2023-10-08 DIAGNOSIS — R29898 Other symptoms and signs involving the musculoskeletal system: Secondary | ICD-10-CM | POA: Diagnosis not present

## 2023-10-08 DIAGNOSIS — M5441 Lumbago with sciatica, right side: Secondary | ICD-10-CM | POA: Diagnosis not present

## 2023-10-08 DIAGNOSIS — M5442 Lumbago with sciatica, left side: Secondary | ICD-10-CM | POA: Diagnosis not present

## 2023-10-08 NOTE — Patient Instructions (Signed)

## 2023-10-08 NOTE — Therapy (Signed)
 OUTPATIENT PHYSICAL THERAPY THORACOLUMBAR    Patient Name: Alex Watson MRN: [id number] DOB:[birth year], 58 y.o., male Today's Date: 10/08/2023  END OF SESSION:  PT End of Session - 10/08/23 0746     Visit Number 3    Number of Visits 7    Date for PT Re-Evaluation 11/01/23    Authorization Type [employer] Comm PPO    Authorization Time Period 09/20/23 to 11/01/23    PT Start Time 0748    PT Stop Time 0835    PT Time Calculation (min) 47 min             Past Medical History:  Diagnosis Date   Allergy    Diabetes mellitus without complication (HCC)    Uncontrolled daytime somnolence 03/07/2013   Past Surgical History:  Procedure Laterality Date   MANDIBLE SURGERY  1987   Patient Active Problem List   Diagnosis Date Noted   Ascending aortic aneurysm (HCC) 02/22/2023   Aortic atherosclerosis (HCC) 02/22/2023   Adrenal adenoma, left 02/22/2023   COPD (chronic obstructive pulmonary disease) case management patient (HCC) 12/04/2022   Chronic cough 08/28/2022   Tobacco use 04/23/2021   Hyperlipidemia 04/23/2021   Warts 04/23/2021   Osteoarthritis of spine with radiculopathy, cervical region 11/15/2015   Subarachnoid bleed (HCC) 01/04/2015   Cerebral contusion and laceration with loss of consciousness (HCC) 01/04/2015   Type 2 diabetes mellitus without complication (HCC) 08/18/2014    PCP: Graig Lawyer, MD  REFERRING PROVIDER: Graig Lawyer, MD  REFERRING DIAG: [phone] (ICD-10-CM) - Acute bilateral low back pain with bilateral sciatica  Rationale for Evaluation and Treatment: Rehabilitation  THERAPY DIAG:  Other low back pain  Other symptoms and signs involving the musculoskeletal system  ONSET DATE: about 3 months ago   SUBJECTIVE:                                                                                                                                                                                           SUBJECTIVE STATEMENT: not as good as  last week. Left LBP stays sore. Inconsistent left radiating pain.   Back feels fine until I go to stand up, its been going on for about 3 months. Its worse in the morning then gets better through the day. If I sit for any length of time it will flare up. Has been staying the same, no change with Ibuprofen  and Dr. Therese Flash eventually sent me to PT. Get some numbness down front of L leg 1-2 times a week, lasts about an hour then goes away.   PERTINENT HISTORY:  See above  PAIN:  Are you  having pain? No 0/10 "just sore"   PRECAUTIONS: None  RED FLAGS: None   WEIGHT BEARING RESTRICTIONS: No  FALLS:  Has patient fallen in last 6 months? Yes. 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    PATIENT SURVEYS:  Modified Oswestry 2/50    COGNITION: Overall cognitive status: Within functional limits for tasks assessed     SENSATION: Not tested  MUSCLE LENGTH: Hamstrings WNL  Piriformis WNL   POSTURE: rounded shoulders and forward head    LUMBAR ROM:   AROM eval  Flexion WNL, RFIS sore but at baseline   Extension Mild lmpairment, REIS no change   Right lateral flexion Mild limitation   Left lateral flexion Mild limitation   Right rotation Moderate limitation   Left rotation Moderate limitation    (Blank rows = not tested)    LOWER EXTREMITY MMT:    MMT Right eval Left eval  Hip flexion 5 5  Hip extension    Hip abduction 5 5  Hip adduction    Hip internal rotation    Hip external rotation    Knee flexion 5 5  Knee  extension 5 5  Ankle dorsiflexion    Ankle plantarflexion    Ankle inversion    Ankle eversion     (Blank rows = not tested)    TREATMENT DATE:   10/07/25 Nustep L 5 5 min LE only Feet on ball, KTC and obl PPT hold 3 sec 10 x ADD ball squeeze with bridge Clams with ppt green tbnad Marching with ppt green tband PROM BIL LE and trunk STW and DN to left LB and into buttock REIsupine with over pressure 2 sets 5 Trigger Point Dry Needling  Initial Treatment: Pt instructed on Dry Needling rational, procedures, and possible side effects. Pt instructed to expect mild to moderate muscle soreness later in the day and/or into the next day.  Pt instructed in methods to reduce muscle soreness. Pt instructed to continue prescribed HEP. Patient was educated on signs and symptoms of infection and other risk factors and advised to seek medical attention should they occur.   Patient Verbal Consent Given: Yes Education Handout Provided: Yes Muscles Treated: left lumbar paraspinals Treatment Response/Outcome: LTR's DN performed and documented by M. Albright PT  Cable pulley shld ext and row 2 sets 10 20# Upright row into trunk ext 2 sets 10 20# 6 in step up opp leg ext 10 x BIL Educ on lifting bending and using LE vs straight over straining spine      10/01/23 Nustep L5x6 minutes BLEs only for w/u and tissue perfusion Sitting on dyna disc pelvis ROM 4 way 10 x each,LE core stab with LAQ.hip flex and abd, postural stab with green tband shld row and ext 15 x Supine feet on ball bridge,KTC,obl 2 sets 10 Supine isometric abdominals 15 x hold 3 sec 6 inch step up with opp leg ext 10 x BIL with UE support then laterally with opp leg abd PROM LE and trunk  Lumbar traction 75# 15 min   09/20/23  Eval, POC, HEP   Nustep L5x6 minutes BLEs only for w/u and tissue perfusion HEP practice of below                                                                                                                                     PATIENT EDUCATION:  Education details: exam findings, HEP, POC  Person educated: Patient Education method: Explanation, Demonstration, and Handouts Education comprehension: verbalized understanding  HOME EXERCISE PROGRAM: Access Code: [email or web address] URL: [email or web address] Date: 09/20/2023 Prepared by: Terrel Ferries  Exercises - Supine Lower Trunk Rotation  - 2 x daily - 7 x weekly - 1 sets - 10 reps - 5 seconds  hold - Supine Double Knee to Chest  - 2 x daily - 7 x weekly - 1 sets - 10 reps - 5 seconds  hold - Supine Posterior Pelvic Tilt  - 1 x daily - 7 x weekly - 2 sets - 10 reps - Small Range SLR + Posterior Pelvic Tilt   - 1 x daily - 7 x weekly - 2 sets - 10 reps  ASSESSMENT:  CLINICAL IMPRESSION:  goals assessed and documented, 3/4 STGS met continue to work on BM and postural awareness. Pt arrived stating he has been sore since last session Left paraspinals, minimal radiating pain ( only over [place]). DN and STW to very tight left lower paraspinal,focus on core stab ex with cuing. Felt better at end of session just tight so stressed stretching at home and showed variations of HEP stretching  Patient is a 58 y.o. M who was seen today for physical therapy evaluation and treatment for M54.42,M54.41 (ICD-10-CM) - Acute bilateral low back pain with bilateral sciatica. I think that a lot of his pain is related to immobility in the joints and musculature of his lumbar spine, as well as faulty biomechanics. He does have a busy travel schedule related to work, but I think he will benefit from skilled PT services as his case is pretty straight forward musculoskeletally.   OBJECTIVE IMPAIRMENTS: decreased ROM, decreased strength, increased fascial restrictions, increased muscle spasms, impaired flexibility, postural dysfunction, and pain.   ACTIVITY LIMITATIONS: sitting and standing  PARTICIPATION LIMITATIONS: driving, community  activity, occupation, and yard work  PERSONAL FACTORS: Behavior pattern, Fitness, Profession, and Time since onset of injury/illness/exacerbation are also affecting patient's functional outcome.   REHAB POTENTIAL: Good  CLINICAL DECISION MAKING: Stable/uncomplicated  EVALUATION COMPLEXITY: Low   GOALS: Goals reviewed with patient? No  SHORT TERM GOALS: Target date: 10/11/2023    Will be compliant with appropriate progressive HEP GOAL STATUS: 10/08/23 MET   2. Will demonstrate improved postural awareness with all functional tasks, use of ergonomic aides PRN/as desired GOAL STATUS: progressing 10/08/23   3. Will demonstrate good functional biomechanics for bed mobility and floor to waist lifting mechanics   GOAL STATUS: 09/3023 MET  4. Lumbar AROM to have normalized  GOAL  STATUS: MET WNLS 10/08/23  LONG TERM GOALS: Target date: 11/01/2023    MMT to have improved by one grade all weak groups GOAL STATUS: Initial  2. Mm flexibility and spasms to have improved by at least 50% in order to improve functional movement patterns and for pain control GOAL STATUS: Initial  3. Pain to be no more than 2/10 with all functional activities and after getting up from extended sitting activities  GOAL STATUS: Initial   4. Will be able to travel as required for work/as desired without increase in symptoms from baseline or numbness L thigh  GOAL STATUS: Initial  PLAN:  PT FREQUENCY: 1-2x/week  PT DURATION: 6 weeks  PLANNED INTERVENTIONS: 97750- Physical Performance Testing, 97110-Therapeutic exercises, 97530- Therapeutic activity, [id number]- Neuromuscular re-education, 97535- Self Care, and 40981- Manual therapy.  PLAN FOR NEXT SESSION: assess and progress   Patient Details  Name: Alex Watson MRN: [id number] Date of Birth: Apr 05, 1966 Referring Provider:  Graig Lawyer, MD  Encounter Date: 10/08/2023   Aquilla Bayley, PTA 10/08/2023, 7:47 AM  [place] [facility] at [facility] [address] [place], [place], [address] Phone: [phone]   Fax:  [phone] at [facility] [address] [place], [place], [address] Phone: [phone]   Fax:  [phone]  Patient Details  Name: Alex Watson MRN: [id number] Date of Birth: 16-Jan-1966 Referring Provider:  Graig Lawyer, MD  Encounter Date: 10/08/2023   Aquilla Bayley, PTA 10/08/2023, 7:47 AM  [place]  [facility] at [facility] [address] [place], [place], [address] Phone: [phone]   Fax:  [phone]

## 2023-10-15 ENCOUNTER — Ambulatory Visit: Attending: Family Medicine | Admitting: Physical Therapy

## 2023-10-15 DIAGNOSIS — M5459 Other low back pain: Secondary | ICD-10-CM | POA: Diagnosis not present

## 2023-10-15 DIAGNOSIS — R29898 Other symptoms and signs involving the musculoskeletal system: Secondary | ICD-10-CM | POA: Diagnosis not present

## 2023-10-15 NOTE — Therapy (Signed)
 OUTPATIENT PHYSICAL THERAPY THORACOLUMBAR    Patient Name: Alex Watson MRN: [id number] DOB:March 13, 1966, 58 y.o., male Today's Date: 10/15/2023  END OF SESSION:  PT End of Session - 10/15/23 0751     Visit Number 4    Number of Visits 7    Date for PT Re-Evaluation 11/01/23    Authorization Type [employer] Comm PPO    Authorization Time Period 09/20/23 to 11/01/23    PT Start Time 0753    PT Stop Time 0840    PT Time Calculation (min) 47 min             Past Medical History:  Diagnosis Date   Allergy    Diabetes mellitus without complication (HCC)    Uncontrolled daytime somnolence 03/07/2013   Past Surgical History:  Procedure Laterality Date   MANDIBLE SURGERY  1987   Patient Active Problem List   Diagnosis Date Noted   Ascending aortic aneurysm (HCC) 02/22/2023   Aortic atherosclerosis (HCC) 02/22/2023   Adrenal adenoma, left 02/22/2023   COPD (chronic obstructive pulmonary disease) case management patient (HCC) 12/04/2022   Chronic cough 08/28/2022   Tobacco use 04/23/2021   Hyperlipidemia 04/23/2021   Warts 04/23/2021   Osteoarthritis of spine with radiculopathy, cervical region 11/15/2015   Subarachnoid bleed (HCC) 01/04/2015   Cerebral contusion and laceration with loss of consciousness (HCC) 01/04/2015   Type 2 diabetes mellitus without complication (HCC) 08/18/2014    PCP: Graig Lawyer, MD  REFERRING PROVIDER: Graig Lawyer, MD  REFERRING DIAG: [phone] (ICD-10-CM) - Acute bilateral low back pain with bilateral sciatica  Rationale for Evaluation and Treatment: Rehabilitation  THERAPY DIAG:  Other low back pain  Other symptoms and signs involving the musculoskeletal system  ONSET DATE: about 3 months ago   SUBJECTIVE:                                                                                                                                                                                           SUBJECTIVE STATEMENT:  No radiating  pain this past week, central LBP. Hurts when I cough- allergies have been bad   PERTINENT HISTORY:  See above  PAIN:  Are you having pain? No 0/10 "just sore"   PRECAUTIONS: None  RED FLAGS: None   WEIGHT BEARING RESTRICTIONS: No  FALLS:  Has patient fallen in last 6 months? Yes. 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    PATIENT SURVEYS:  Modified Oswestry 2/50    COGNITION: Overall cognitive status: Within functional limits for tasks assessed     SENSATION: Not tested  MUSCLE LENGTH: Hamstrings WNL  Piriformis WNL   POSTURE: rounded shoulders and forward head    LUMBAR ROM:   AROM eval  Flexion WNL, RFIS sore but at baseline   Extension Mild lmpairment, REIS no change   Right lateral flexion Mild limitation   Left lateral flexion Mild limitation   Right rotation Moderate limitation   Left rotation Moderate limitation    (Blank rows = not tested)    LOWER EXTREMITY MMT:    MMT Right eval Left eval  Hip flexion 5 5  Hip extension    Hip abduction 5 5  Hip adduction    Hip internal rotation    Hip external rotation    Knee flexion 5 5  Knee extension 5 5  Ankle dorsiflexion    Ankle plantarflexion    Ankle inversion    Ankle eversion     (Blank rows = not tested)    TREATMENT DATE:   10/15/23 Nustep L 5 Preformed and issued HEP - ACCESS CODE [id number] Green tband hip ext and abd, shld ext,row and horz abd. Ppt, ppt with SLR, ppt with bridge,ppt with marching. Ppt seated and  standing Feet on ball bridge, obl and KTC with bridge Iso abdominals with ball 15 x hold 3 sec Wt ball core ex- multi directional Resisted trunk flexion 15 x each with tband PROM LE and trunk Sacral distraction and stretch   10/07/25 Nustep L 5 5 min LE only Feet on ball, KTC and obl PPT hold 3 sec 10 x ADD ball squeeze with bridge Clams with ppt green tbnad Marching with ppt green tband PROM BIL LE and trunk STW and DN to left LB and into buttock REIsupine with over pressure 2 sets 5 Trigger Point Dry Needling  Initial Treatment: Pt instructed on Dry Needling rational, procedures, and possible side effects. Pt instructed to expect mild to moderate muscle soreness later in the day and/or into the next day.  Pt instructed in methods to reduce muscle soreness. Pt instructed to continue prescribed HEP. Patient was educated on signs and symptoms of infection and other risk factors and advised to seek medical attention should they occur.   Patient Verbal Consent Given: Yes Education Handout Provided: Yes Muscles Treated: left lumbar paraspinals Treatment Response/Outcome: LTR's DN performed and documented by M. Albright PT  Cable pulley shld ext and row 2 sets 10 20# Upright row into trunk ext 2 sets 10 20# 6 in step up opp leg ext 10 x BIL Educ on lifting bending and using LE vs straight over straining spine      10/01/23 Nustep L5x6 minutes BLEs only for w/u and tissue perfusion Sitting on dyna disc pelvis ROM 4 way 10 x each,LE core stab with LAQ.hip flex and abd, postural stab with green tband shld row and ext 15 x Supine feet on ball bridge,KTC,obl 2 sets 10 Supine isometric abdominals 15 x hold 3 sec 6 inch step up with opp leg ext 10 x BIL with UE support then laterally with opp leg abd PROM LE and trunk  Lumbar traction 75# 15 min  09/20/23  Eval, POC, HEP   Nustep L5x6 minutes BLEs only for w/u and tissue perfusion HEP practice of below                                                                                                                                     PATIENT EDUCATION:  Education details: exam findings, HEP, POC  Person educated: Patient Education method: Explanation, Demonstration, and Handouts Education comprehension: verbalized understanding  HOME EXERCISE PROGRAM: ACCESS CODE [id number]  Access Code: [email or web address] URL: [email or web address] Date: 09/20/2023 Prepared by: Terrel Ferries  Exercises - Supine Lower Trunk Rotation  - 2 x daily - 7 x weekly - 1 sets - 10 reps - 5 seconds  hold - Supine Double Knee to Chest  - 2 x daily - 7 x weekly - 1 sets - 10 reps - 5 seconds  hold - Supine Posterior Pelvic Tilt  - 1 x daily - 7 x weekly - 2 sets - 10 reps - Small Range SLR + Posterior Pelvic Tilt   - 1 x daily - 7 x weekly - 2 sets - 10 reps  ASSESSMENT:  CLINICAL IMPRESSION:  pt arrives and states feeling better,overall 60%. Denies radiating pain unless he coughs and c/o central LBP. Advanced HEP and educ on ned for core strength to support back and decrease stain and pain. Progressing with LTGs OBJECTIVE IMPAIRMENTS: decreased ROM, decreased strength, increased fascial restrictions, increased muscle spasms, impaired flexibility, postural dysfunction, and pain.   ACTIVITY LIMITATIONS: sitting and standing  PARTICIPATION LIMITATIONS: driving, community activity, occupation, and yard work  PERSONAL FACTORS: Behavior pattern, Fitness, Profession, and Time since onset of injury/illness/exacerbation are also affecting patient's functional outcome.   REHAB POTENTIAL: Good  CLINICAL DECISION MAKING: Stable/uncomplicated  EVALUATION COMPLEXITY: Low   GOALS: Goals reviewed with patient? No  SHORT TERM GOALS: Target date: 10/11/2023    Will be compliant with appropriate progressive HEP GOAL STATUS: 10/08/23 MET   2. Will demonstrate improved postural awareness with all functional tasks, use of ergonomic  aides PRN/as desired GOAL STATUS: progressing 10/08/23  10/15/23 MET   3. Will demonstrate good functional biomechanics for bed mobility and floor to waist lifting mechanics   GOAL STATUS: 09/3023 MET  4. Lumbar AROM to have normalized  GOAL STATUS: MET WNLS 10/08/23  LONG TERM GOALS: Target date: 11/01/2023    MMT to have improved by one grade all weak groups GOAL STATUS: progressing 10/15/23  2. Mm flexibility and spasms to have improved by at least 50% in order to improve functional movement patterns and for pain control GOAL STATUS:  progressing 10/15/23  3. Pain to be no more than 2/10 with all functional activities and after getting up from extended sitting activities  GOAL STATUS: progressing 10/15/23   4. Will be able to travel as required for work/as desired without increase in symptoms from baseline or numbness L thigh  GOAL STATUS: progressing  10/15/23 PLAN:  PT FREQUENCY: 1-2x/week  PT DURATION: 6 weeks  PLANNED INTERVENTIONS: 97750- Physical Performance Testing, 97110-Therapeutic exercises, 97530- Therapeutic activity, 97112- Neuromuscular re-education, 97535- Self Care, and 09811- Manual therapy.  PLAN FOR NEXT SESSION: 1 more appt next week then he will need to be OOT x 3-4 weeks   Patient Details  Name: Alex Watson MRN: [id number] Date of Birth: [birth year] Referring Provider:  Graig Lawyer, MD  Encounter Date: 10/15/2023   Aquilla Bayley, PTA 10/15/2023, 7:52 AM  [facility] [address] [place], [place], [address] Phone: [phone]   Fax:  [phone] [address] [place], [place], [address] Phone: [phone]   Fax:  [phone]

## 2023-10-22 ENCOUNTER — Ambulatory Visit: Admitting: Physical Therapy

## 2023-10-22 DIAGNOSIS — M5459 Other low back pain: Secondary | ICD-10-CM | POA: Diagnosis not present

## 2023-10-22 DIAGNOSIS — R29898 Other symptoms and signs involving the musculoskeletal system: Secondary | ICD-10-CM | POA: Diagnosis not present

## 2023-10-22 NOTE — Therapy (Signed)
 OUTPATIENT PHYSICAL THERAPY THORACOLUMBAR    Patient Name: Alex Watson MRN: [id number] DOB:04/24/1966, 58 y.o., male Today's Date: 10/22/2023  END OF SESSION:  PT End of Session - 10/22/23 0753     Visit Number 5    Number of Visits 7    Date for PT Re-Evaluation 11/01/23    Authorization Type [employer] Comm PPO    Authorization Time Period 09/20/23 to 11/01/23    PT Start Time 0755    PT Stop Time 0835    PT Time Calculation (min) 40 min          Past Medical History:  Diagnosis Date   Allergy    Diabetes mellitus without complication (HCC)    Uncontrolled daytime somnolence 03/07/2013   Past Surgical History:  Procedure Laterality Date   MANDIBLE SURGERY  1987   Patient Active Problem List   Diagnosis Date Noted   Ascending aortic aneurysm (HCC) 02/22/2023   Aortic atherosclerosis (HCC) 02/22/2023   Adrenal adenoma, left 02/22/2023   COPD (chronic obstructive pulmonary disease) case management patient (HCC) 12/04/2022   Chronic cough 08/28/2022   Tobacco use 04/23/2021   Hyperlipidemia 04/23/2021   Warts 04/23/2021   Osteoarthritis of spine with radiculopathy, cervical region 11/15/2015   Subarachnoid bleed (HCC) 01/04/2015   Cerebral contusion and laceration with loss of consciousness (HCC) 01/04/2015   Type 2 diabetes mellitus without complication (HCC) 08/18/2014    PCP: Graig Lawyer, MD  REFERRING PROVIDER: Graig Lawyer, MD  REFERRING DIAG: [phone] (ICD-10-CM) - Acute bilateral low back pain with bilateral sciatica  Rationale for Evaluation and Treatment: Rehabilitation  THERAPY DIAG:  Other low back pain  ONSET DATE: about 3 months ago   SUBJECTIVE:                                                                                                                                                                                           SUBJECTIVE STATEMENT: 70-80 % better overall. No radiating pain for a couple weeks. Headed oot for about 3  weeks for work. HEP is good- definitely works the core.    PERTINENT HISTORY:  See above  PAIN:  Are you having pain? No 0/10 just sore   PRECAUTIONS: None  RED FLAGS: None   WEIGHT BEARING RESTRICTIONS: No  FALLS:  Has patient fallen in last 6 months? Yes. 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    PATIENT SURVEYS:  Modified Oswestry 2/50    COGNITION: Overall cognitive status: Within functional limits for tasks assessed     SENSATION: Not tested  MUSCLE LENGTH: Hamstrings WNL  Piriformis WNL   POSTURE: rounded shoulders and forward head    LUMBAR ROM:   AROM eval  Flexion WNL, RFIS sore but at baseline   Extension Mild lmpairment, REIS no change   Right lateral flexion Mild limitation   Left lateral flexion Mild limitation   Right rotation Moderate limitation   Left rotation Moderate limitation    (Blank rows = not tested)    LOWER EXTREMITY MMT:    MMT Right eval Left eval  Hip flexion 5 5  Hip extension    Hip abduction 5 5  Hip adduction    Hip internal rotation    Hip external rotation    Knee flexion 5 5  Knee extension 5 5  Ankle dorsiflexion    Ankle plantarflexion    Ankle inversion    Ankle eversion     (Blank rows = not tested)    TREATMENT DATE:   10/22/23 Nustep L 5 Standing shld ext 15# 2 sets 12 Standing row 15# 2 sets 12 AR press, circles and rotation 15# cable pulleys 10 x BIL 5# modified dead lift 2 sets 10 Upright row into ext 2 sets 10 20# 20# lateral  trunk flex 15 x BIL Supine core stab x 10 min PROM LE and trunk    10/15/23 Nustep L 5 Preformed and issued HEP - ACCESS CODE [id number] Green tband hip ext and abd, shld ext,row and horz abd. Ppt, ppt with SLR, ppt with bridge,ppt with marching. Ppt seated and standing Feet on ball bridge, obl and KTC with bridge Iso abdominals with ball 15 x hold 3 sec Wt ball core ex- multi directional Resisted trunk flexion 15 x each with tband PROM LE and trunk Sacral distraction and stretch   10/07/25 Nustep L 5 5 min LE only Feet on ball, KTC and obl PPT hold 3 sec 10 x ADD ball squeeze with bridge Clams with ppt green tbnad Marching with ppt green tband PROM BIL LE and trunk STW and DN to left LB and into buttock REIsupine with over pressure 2 sets 5 Trigger Point Dry Needling  Initial Treatment: Pt instructed on Dry Needling rational, procedures, and possible side effects. Pt instructed to expect mild to moderate muscle soreness later in the day and/or into the next day.  Pt instructed in methods to reduce muscle soreness. Pt instructed to continue prescribed HEP. Patient was educated on signs and symptoms of infection and other risk factors and advised to seek medical attention should they occur.   Patient Verbal Consent Given: Yes Education Handout Provided: Yes Muscles Treated: left lumbar paraspinals Treatment Response/Outcome: LTR's DN performed and documented by M. Albright PT  Cable pulley shld ext and row 2 sets 10 20# Upright row into trunk ext 2 sets 10 20# 6 in step up opp leg ext 10 x BIL Educ on lifting bending and using LE vs straight over straining spine      10/01/23 Nustep L5x6 minutes BLEs only for w/u and tissue perfusion Sitting on dyna disc pelvis ROM 4 way 10 x each,LE core stab  with LAQ.hip flex and abd, postural stab with green tband shld row and ext 15 x Supine feet on ball bridge,KTC,obl 2 sets 10 Supine isometric abdominals 15 x hold 3 sec 6  inch step up with opp leg ext 10 x BIL with UE support then laterally with opp leg abd PROM LE and trunk  Lumbar traction 75# 15 min   09/20/23  Eval, POC, HEP   Nustep L5x6 minutes BLEs only for w/u and tissue perfusion HEP practice of below                                                                                                                                    PATIENT EDUCATION:  Education details: exam findings, HEP, POC  Person educated: Patient Education method: Explanation, Demonstration, and Handouts Education comprehension: verbalized understanding  HOME EXERCISE PROGRAM: ACCESS CODE [id number]  Access Code: [email or web address] URL: [email or web address] Date: 09/20/2023 Prepared by: Terrel Ferries  Exercises - Supine Lower Trunk Rotation  - 2 x daily - 7 x weekly - 1 sets - 10 reps - 5 seconds  hold - Supine Double Knee to Chest  - 2 x daily - 7 x weekly - 1 sets - 10 reps - 5 seconds  hold - Supine Posterior Pelvic Tilt  - 1 x daily - 7 x weekly - 2 sets - 10 reps - Small Range SLR + Posterior Pelvic Tilt   - 1 x daily - 7 x weekly - 2 sets - 10 reps  ASSESSMENT:  CLINICAL IMPRESSION:  70-80 % better overall. No radiating pain for a couple weeks. Headed oot for about 3 weeks for work. HEP is good- definitely works the core. Progress core strength and func with cuing to engae appropriate muscles. LTGs assessed and documented.    OBJECTIVE IMPAIRMENTS: decreased ROM, decreased strength, increased fascial restrictions, increased muscle spasms, impaired flexibility, postural dysfunction, and pain.   ACTIVITY LIMITATIONS: sitting and standing  PARTICIPATION LIMITATIONS: driving, community activity, occupation, and yard work  PERSONAL FACTORS: Behavior pattern, Fitness, Profession, and Time since onset of injury/illness/exacerbation are also affecting patient's functional outcome.   REHAB POTENTIAL: Good  CLINICAL DECISION MAKING:  Stable/uncomplicated  EVALUATION COMPLEXITY: Low   GOALS: Goals reviewed with patient? No  SHORT TERM GOALS: Target date: 10/11/2023    Will be compliant with appropriate progressive HEP GOAL STATUS: 10/08/23 MET   2. Will demonstrate improved postural awareness with all functional tasks, use of ergonomic aides PRN/as desired GOAL STATUS: progressing 10/08/23  10/15/23 MET   3. Will demonstrate good functional biomechanics for bed mobility and floor to waist lifting mechanics   GOAL STATUS: 09/3023 MET  4. Lumbar AROM to have normalized  GOAL STATUS: MET WNLS 10/08/23  LONG TERM GOALS: Target date: 11/01/2023    MMT to have improved by one grade all weak groups GOAL STATUS: progressing 10/15/23  MET 10/22/23  2. Mm flexibility and spasms to have  improved by at least 50% in order to improve functional movement patterns and for pain control GOAL STATUS:  progressing 10/15/23   progressing 10/22/23  3. Pain to be no more than 2/10 with all functional activities and after getting up from extended sitting activities  GOAL STATUS: progressing 10/15/23  and 10/22/23   4. Will be able to travel as required for work/as desired without increase in symptoms from baseline or numbness L thigh  GOAL STATUS: progressing 10/15/23  10/22/23 MET PLAN:  PT FREQUENCY: 1-2x/week  PT DURATION: 6 weeks  PLANNED INTERVENTIONS: 97750- Physical Performance Testing, 97110-Therapeutic exercises, 97530- Therapeutic activity, 97112- Neuromuscular re-education, 97535- Self Care, and 40981- Manual therapy.  PLAN FOR NEXT SESSION: pt headed OOT 3 plus weeks will call when he returns   Patient Details  Name: JAIVIAN BATTAGLINI MRN: [id number] Date of Birth: [birth year] Referring Provider:  Graig Lawyer, MD  Encounter Date: 10/22/2023   Aquilla Bayley, PTA 10/22/2023, 8:18 AM  [facility] [address] [place], [place], [address] Phone: [phone]   Fax:   [phone] [address] [place], [place], [address] Phone: [phone]   Fax:  [phone]

## 2023-12-04 ENCOUNTER — Other Ambulatory Visit: Payer: Self-pay | Admitting: Family Medicine

## 2023-12-04 DIAGNOSIS — F5101 Primary insomnia: Secondary | ICD-10-CM

## 2023-12-07 NOTE — Telephone Encounter (Signed)
 Refill request for  Trazodone  50 mg LR  04/27/23, #30, 5 rf LOV  08/31/23 FOV  none scheduled.    Please review and advise.  Thanks. Dm/cma

## 2023-12-17 ENCOUNTER — Encounter: Payer: Self-pay | Admitting: "Endocrinology

## 2023-12-17 ENCOUNTER — Ambulatory Visit: Admitting: "Endocrinology

## 2023-12-17 VITALS — BP 100/80 | HR 85 | Ht 77.0 in | Wt 225.0 lb

## 2023-12-17 DIAGNOSIS — E7800 Pure hypercholesterolemia, unspecified: Secondary | ICD-10-CM | POA: Diagnosis not present

## 2023-12-17 DIAGNOSIS — E1065 Type 1 diabetes mellitus with hyperglycemia: Secondary | ICD-10-CM | POA: Diagnosis not present

## 2023-12-17 LAB — POCT GLYCOSYLATED HEMOGLOBIN (HGB A1C): Hemoglobin A1C: 7.4 % — AB (ref 4.0–5.6)

## 2023-12-17 NOTE — Patient Instructions (Addendum)
 Recommend:  Tresiba  44-46 units daily    Lispro 19-23 units around 15 min before supper  (take additional 5 units if BG >200 at bedtime) Correction scale: Use in addition to your meal time/short acting insulin  based on blood sugars as follows:  151 - 190: 1 unit 191 - 230: 2 units 231 - 270: 3 units 271 - 310: 4 units 311 - 350: 5 units 351 - 390: 6 units 391 - 430: 7 units  _____________ Goals of DM therapy:  Morning Fasting blood sugar: 80-140  Blood sugar before meals: 80-140 Bed time blood sugar: 100-150  A1C <7%, limited only by hypoglycemia  1.Diabetes medications and their side effects discussed, including hypoglycemia    2. Check blood glucose:  a) Always check blood sugars before driving. Please see below (under hypoglycemia) on how to manage b) Check a minimum of 3 times/day or more as needed when having symptoms of hypoglycemia.   c) Try to check blood glucose before sleeping/in the middle of the night to ensure that it is remaining stable and not dropping less than 100 d) Check blood glucose more often if sick  3. Diet: a) 3 meals per day schedule b: Restrict carbs to 60-70 grams (4 servings) per meal c) Colorful vegetables - 3 servings a day, and low sugar fruit 2 servings/day Plate control method: 1/4 plate protein, 1/4 starch, 1/2 green, yellow, or red vegetables d) Avoid carbohydrate snacks unless hypoglycemic episode, or increased physical activity  4. Regular exercise as tolerated, preferably 3 or more hours a week  5. Hypoglycemia: a)  Do not drive or operate machinery without first testing blood glucose to assure it is over 90 mg%, or if dizzy, lightheaded, not feeling normal, etc, or  if foot or leg is numb or weak. b)  If blood glucose less than 70, take four 5gm Glucose tabs or 15-30 gm Glucose gel.  Repeat every 15 min as needed until blood sugar is >100 mg/dl. If hypoglycemia persists then call 911.   6. Sick day management: a) Check blood glucose  more often b) Continue usual therapy if blood sugars are elevated.   7. Contact the doctor immediately if blood glucose is frequently <60 mg/dl, or an episode of severe hypoglycemia occurs (where someone had to give you glucose/  glucagon or if you passed out from a low blood glucose), or if blood glucose is persistently >350 mg/dl, for further management  8. A change in level of physical activity or exercise and a change in diet may also affect your blood sugar. Check blood sugars more often and call if needed.  Instructions: 1. Bring glucose meter, blood glucose records on every visit for review 2. Continue to follow up with primary care physician and other providers for medical care 3. Yearly eye  and foot exam 4. Please get blood work done prior to the next appointment

## 2023-12-17 NOTE — Progress Notes (Signed)
 Outpatient Endocrinology Note Alex Birmingham, MD  12/17/23   Alex Watson 12/29/1965 [id number]  Referring Provider: Thedora Garnette HERO, MD Primary Care Provider: Thedora Garnette HERO, MD Reason for consultation: Subjective   Assessment & Plan  Diagnoses and all orders for this visit:  Uncontrolled type 1 diabetes mellitus with hyperglycemia (HCC) -     POCT glycosylated hemoglobin (Hb A1C) -     Lipid panel -     Microalbumin / creatinine urine ratio -     Comprehensive metabolic panel with GFR -     C-peptide -     GAD65, IA-2, and Insulin  Autoantibody serum  Pure hypercholesterolemia    Diabetes Type I complicated by hyperglycemia   Lab Results  Component Value Date   GFR 93.88 11/16/2022   Hba1c goal less than 7, current Hba1c is  Lab Results  Component Value Date   HGBA1C 8.0 (A) 07/26/2023   Tresiba  44-46 units daily    Lispro 19-23 units around 15 min before supper  (take additional 5 units if BG >200 at bedtime) Correction scale: Use in addition to your meal time/short acting insulin  based on blood sugars as follows:  151 - 190: 1 unit 191 - 230: 2 units 231 - 270: 3 units 271 - 310: 4 units 311 - 350: 5 units 351 - 390: 6 units 391 - 430: 7 units  Instructed to eat 3 equal sized meals rather than 1 big and 2 minor meals -patient continues to eat biggest supper meal leading to hyperglycemia 4 hours after the meal.  No known contraindications to any of above medications  -Last LD and Tg are as follows: Lab Results  Component Value Date   LDLCALC 55 11/20/2022    Lab Results  Component Value Date   TRIG 64.0 11/20/2022   -On atorvastatin  40 mg every day -Follow low fat diet and exercise   -Blood pressure goal <140/90 - Microalbumin/creatinine goal < 30 -Last MA/Cr is as follows: No results found for: MICROALBUR, [id number]  -not on ACE/ARB -diet changes including salt restriction -limit eating outside -counseled BP targets per standards  of diabetes care -uncontrolled blood pressure can lead to retinopathy, nephropathy and cardiovascular and atherosclerotic heart disease  Reviewed and counseled on: -A1C target -Blood sugar targets -Complications of uncontrolled diabetes  -Checking blood sugar before meals and bedtime and bring log next visit -All medications with mechanism of action and side effects -Hypoglycemia management: rule of 15's, Glucagon Emergency Kit and medical alert ID -low-carb low-fat plate-method diet -At least 20 minutes of physical activity per day -Annual dilated retinal eye exam and foot exam -compliance and follow up needs -follow up as scheduled or earlier if problem gets worse  Call if blood sugar is less than 70 or consistently above 250    Take a 15 gm snack of carbohydrate at bedtime before you go to sleep if your blood sugar is less than 100.    If you are going to fast after midnight for a test or procedure, ask your physician for instructions on how to reduce/decrease your insulin  dose.    Call if blood sugar is less than 70 or consistently above 250  -Treating a low sugar by rule of 15  (15 gms of sugar every 15 min until sugar is more than 70) If you feel your sugar is low, test your sugar to be sure If your sugar is low (less than 70), then take 15 grams of a fast acting  Carbohydrate (3-4 glucose tablets or glucose gel or 4 ounces of juice or regular soda) Recheck your sugar 15 min after treating low to make sure it is more than 70 If sugar is still less than 70, treat again with 15 grams of carbohydrate          Don't drive the hour of hypoglycemia  If unconscious/unable to eat or drink by mouth, use glucagon injection or nasal spray baqsimi and call 911. Can repeat again in 15 min if still unconscious.  No follow-ups on file.   I have reviewed current medications, nurse's notes, allergies, vital signs, past medical and surgical history, family medical history, and social history  for this encounter. Counseled patient on symptoms, examination findings, lab findings, imaging results, treatment decisions and monitoring and prognosis. The patient understood the recommendations and agrees with the treatment plan. All questions regarding treatment plan were fully answered.  Alex Birmingham, MD  12/17/23    History of Present Illness Alex Watson is a 58 y.o. year old male who presents for follow up on Type I diabetes mellitus.  Alex Watson was first diagnosed in 2016.   Diabetes education +  Current regimen: Tresiba  46-48 units daily    Lispro 18-22 units around 15 min before supper   Previous history:  Insulin  was started about 3 months after his diagnosis since oral agents failed Appears to be mostly on some form of glargine insulin  along with mealtime insulin   COMPLICATIONS -  MI/Stroke -  retinopathy -  neuropathy -  nephropathy  BLOOD SUGAR DATA CGM interpretation: At today's visit, we reviewed her CGM downloads. The full report is scanned in the media. Reviewing the CGM trends, BG are elevated overnight and after supper and lows randomly, overnight and evening.  Physical Exam  BP 100/80   Pulse 85   Ht 6' 5 (1.956 m)   Wt 225 lb (102.1 kg)   SpO2 96%   BMI 26.68 kg/m    Constitutional: well developed, well nourished Head: normocephalic, atraumatic Eyes: sclera anicteric, no redness Neck: supple Lungs: normal respiratory effort Neurology: alert and oriented Skin: dry, no appreciable rashes Musculoskeletal: no appreciable defects Psychiatric: normal mood and affect Diabetic Foot Exam - Simple   Simple Foot Form Diabetic Foot exam was performed with the following findings: Yes 12/17/2023  8:26 AM  Visual Inspection No deformities, no ulcerations, no other skin breakdown bilaterally: Yes Sensation Testing Intact to touch and monofilament testing bilaterally: Yes Pulse Check Posterior Tibialis and Dorsalis pulse intact bilaterally:  Yes Comments      Current Medications Patient's Medications  New Prescriptions   No medications on file  Previous Medications   ALBUTEROL  (VENTOLIN  HFA) 108 (90 BASE) MCG/ACT INHALER    Inhale 1-2 puffs into the lungs every 6 (six) hours as needed for wheezing or shortness of breath.   ATORVASTATIN  (LIPITOR) 40 MG TABLET    TAKE 1 TABLET(40 MG) BY MOUTH DAILY   CONTINUOUS GLUCOSE SENSOR (DEXCOM G7 SENSOR) MISC    Change every 10 days   INSULIN  LISPRO (HUMALOG  KWIKPEN) 100 UNIT/ML KWIKPEN    Inject 22-24 Units into the skin in the morning and at bedtime.   INSULIN  SYRINGE-NEEDLE U-100 (B-D INS SYR ULTRAFINE 1CC/30G) 30G X 1/2 1 ML MISC    USE 1 SYRINGE/NEEDLE DAILY   NAPROXEN  (NAPROSYN ) 500 MG TABLET    Take 1 tablet (500 mg total) by mouth 2 (two) times daily with a meal.   TRAZODONE  (DESYREL ) 50 MG  TABLET    TAKE 1/2 TO 1 TABLET(25 TO 50 MG) BY MOUTH AT BEDTIME AS NEEDED FOR SLEEP  Modified Medications   No medications on file  Discontinued Medications   No medications on file    Allergies Allergies  Allergen Reactions   Morphine     REACTION: difficulty breathing    Past Medical History Past Medical History:  Diagnosis Date   Allergy    Diabetes mellitus without complication (HCC)    Uncontrolled daytime somnolence 03/07/2013    Past Surgical History Past Surgical History:  Procedure Laterality Date   MANDIBLE SURGERY  1987    Family History family history includes Arthritis in his mother; Asthma in his mother; COPD in his father; Diabetes in his father; Heart disease in his sister; Lupus in his sister.  Social History Social History   Socioeconomic History   Marital status: Married    Spouse name: Not on file   Number of children: 2   Years of education: Not on file   Highest education level: Associate degree: occupational, Scientist, product/process development, or vocational program  Occupational History   Occupation: [employer]    Comment: [employer]   Tobacco Use   Smoking status: Every Day    Current packs/day: 0.50    Average packs/day: 1 pack/day for 34.6 years (33.8 ttl pk-yrs)    Types: Cigarettes    Start date: 2024   Smokeless tobacco: Never  Vaping Use   Vaping status: Never Used  Substance and Sexual Activity   Alcohol use: Yes    Comment: BEER   Drug use: No   Sexual activity: Yes    Comment: number of sex partners in the last 12 months 1  Other Topics Concern   Not on file  Social History Narrative   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)   Social Drivers of Corporate investment banker Strain: Low Risk  (08/31/2023)   Overall Financial Resource Strain (CARDIA)    Difficulty of Paying Living Expenses: Not hard at all  Food Insecurity: No Food Insecurity (08/31/2023)   Hunger Vital Sign    Worried About Running Out of Food in the Last Year: Never true    Ran Out of Food in the Last Year: Never true  Transportation Needs: No Transportation Needs (08/31/2023)   PRAPARE - Administrator, Civil Service (Medical): No    Lack of Transportation (Non-Medical): No  Physical Activity: Unknown (08/31/2023)   Exercise Vital Sign    Days of Exercise per Week: 2 days    Minutes of Exercise per Session: Patient declined  Stress: No Stress Concern Present (08/31/2023)   [employer] of Occupational Health - Occupational Stress Questionnaire    Feeling of Stress : Only a little  Social Connections: Moderately Isolated (08/31/2023)   Social Connection and Isolation Panel    Frequency of Communication with Friends and Family: More than three times a week    Frequency of Social Gatherings with Friends and Family: Once a week    Attends Religious Services: Never    Database administrator or Organizations: No    Attends Banker Meetings: Not on file    Marital Status: Married  Intimate Partner Violence: Not on file    Lab Results  Component Value Date   HGBA1C 8.0 (A) 07/26/2023   HGBA1C 8.0 (A) 02/22/2023    HGBA1C 8.2 (H) 11/16/2022   Lab Results  Component Value Date   CHOL 105 11/20/2022  Lab Results  Component Value Date   HDL 37.30 (L) 11/20/2022   Lab Results  Component Value Date   LDLCALC 55 11/20/2022   Lab Results  Component Value Date   TRIG 64.0 11/20/2022   Lab Results  Component Value Date   CHOLHDL 3 11/20/2022   Lab Results  Component Value Date   CREATININE 0.92 11/16/2022   Lab Results  Component Value Date   GFR 93.88 11/16/2022   No results found for: MACKEY CURRENT     Component Value Date/Time   NA 139 11/16/2022 0927   K 4.6 11/16/2022 0927   CL 102 11/16/2022 0927   CO2 28 11/16/2022 0927   GLUCOSE 70 11/16/2022 0927   BUN 10 11/16/2022 0927   CREATININE 0.92 11/16/2022 0927   CREATININE 0.84 12/16/2015 1138   CALCIUM  9.7 11/16/2022 0927   PROT 6.8 11/20/2022 0855   ALBUMIN 4.4 11/20/2022 0855   AST 16 11/20/2022 0855   ALT 24 11/20/2022 0855   ALKPHOS 87 11/20/2022 0855   BILITOT 0.5 11/20/2022 0855   GFRNONAA >60 01/08/2015 1736   GFRAA >60 01/08/2015 1736      Latest Ref Rng & Units 11/16/2022    9:27 AM 09/04/2022    9:07 AM 11/17/2021    4:55 PM  BMP  Glucose 70 - 99 mg/dL 70  837  859   BUN 6 - 23 mg/dL 10   12   Creatinine 9.59 - 1.50 mg/dL 9.07   9.16   Sodium 864 - 145 mEq/L 139   136   Potassium 3.5 - 5.1 mEq/L 4.6   4.3   Chloride 96 - 112 mEq/L 102   100   CO2 19 - 32 mEq/L 28   29   Calcium  8.4 - 10.5 mg/dL 9.7   9.4        Component Value Date/Time   WBC 8.3 05/03/2019 0833   RBC 4.96 05/03/2019 0833   HGB 16.4 05/03/2019 0833   HCT 48.7 05/03/2019 0833   PLT 317.0 05/03/2019 0833   MCV 98.2 05/03/2019 0833   MCV 92.3 09/30/2017 1546   MCH 31.5 (A) 09/30/2017 1546   MCH 32.6 01/08/2015 1736   MCHC 33.7 05/03/2019 0833   RDW 14.0 05/03/2019 0833   LYMPHSABS 2.5 01/08/2015 1736   MONOABS 0.6 01/08/2015 1736   EOSABS 0.2 01/08/2015 1736   BASOSABS 0.0 01/08/2015 1736     Parts of this note  may have been dictated using voice recognition software. There may be variances in spelling and vocabulary which are unintentional. Not all errors are proofread. Please notify the dino if any discrepancies are noted or if the meaning of any statement is not clear.

## 2024-01-25 ENCOUNTER — Other Ambulatory Visit: Payer: Self-pay

## 2024-02-07 ENCOUNTER — Other Ambulatory Visit

## 2024-02-07 DIAGNOSIS — E1065 Type 1 diabetes mellitus with hyperglycemia: Secondary | ICD-10-CM | POA: Diagnosis not present

## 2024-02-11 ENCOUNTER — Encounter: Payer: Self-pay | Admitting: "Endocrinology

## 2024-02-11 ENCOUNTER — Ambulatory Visit: Admitting: "Endocrinology

## 2024-02-11 VITALS — BP 98/60 | HR 75 | Resp 16 | Ht 77.0 in | Wt 227.2 lb

## 2024-02-11 DIAGNOSIS — E7800 Pure hypercholesterolemia, unspecified: Secondary | ICD-10-CM

## 2024-02-11 DIAGNOSIS — E663 Overweight: Secondary | ICD-10-CM

## 2024-02-11 DIAGNOSIS — E1065 Type 1 diabetes mellitus with hyperglycemia: Secondary | ICD-10-CM | POA: Diagnosis not present

## 2024-02-11 MED ORDER — TIRZEPATIDE 2.5 MG/0.5ML ~~LOC~~ SOAJ
2.5000 mg | SUBCUTANEOUS | 1 refills | Status: AC
Start: 2024-02-11 — End: ?

## 2024-02-11 NOTE — Progress Notes (Signed)
 Outpatient Endocrinology Note Obadiah Birmingham, MD  02/11/24   Alex Watson 1965/06/08 [id number]  Referring Provider: Thedora Garnette HERO, MD Primary Care Provider: Thedora Garnette HERO, MD Reason for consultation: Subjective   Assessment & Plan  Diagnoses and all orders for this visit:  Uncontrolled type 1 diabetes mellitus with hyperglycemia (HCC) -     Ambulatory referral to diabetic education  Pure hypercholesterolemia  Overweight -     tirzepatide (MOUNJARO) 2.5 MG/0.5ML Pen; Inject 2.5 mg into the skin once a week.   Diabetes Type I complicated by hyperglycemia   Lab Results  Component Value Date   GFR 93.88 11/16/2022   Hba1c goal less than 7, current Hba1c is  Lab Results  Component Value Date   HGBA1C 7.4 (A) 12/17/2023   Tresiba  46 units daily    Lispro 19-23 units around 15 min before supper  (take additional 5 units if BG >200 at bedtime) Correction scale: Use in addition to your meal time/short acting insulin  based on blood sugars as follows:  151 - 190: 1 unit 191 - 230: 2 units 231 - 270: 3 units 271 - 310: 4 units 311 - 350: 5 units 351 - 390: 6 units 391 - 430: 7 units  02/11/24: Start mounajro 2.5mg /week to help decrease BG variability Ordered DM education  Instructed pt to cut down carb consumption variability and take lispro 15 min before meals (take half the dose of BG before meals is less than 100 and skip it if BG is less than 70) Denies insulin  pump  No history of MEN syndrome/medullary thyroid  cancer/pancreatitis or pancreatic cancer in self or family Instructed to eat 3 equal sized meals rather than 1 big and 2 minor meals -patient continues to eat biggest supper meal leading to hyperglycemia 4 hours after the meal.  No known contraindications to any of above medications  -Last LD and Tg are as follows: Lab Results  Component Value Date   LDLCALC 58 02/07/2024    Lab Results  Component Value Date   TRIG 76 02/07/2024   -On  atorvastatin  40 mg every day -Follow low fat diet and exercise   -Blood pressure goal <140/90 - Microalbumin/creatinine goal < 30 -Last MA/Cr is as follows: Lab Results  Component Value Date   MICROALBUR <0.2 02/07/2024    -not on ACE/ARB -diet changes including salt restriction -limit eating outside -counseled BP targets per standards of diabetes care -uncontrolled blood pressure can lead to retinopathy, nephropathy and cardiovascular and atherosclerotic heart disease  Reviewed and counseled on: -A1C target -Blood sugar targets -Complications of uncontrolled diabetes  -Checking blood sugar before meals and bedtime and bring log next visit -All medications with mechanism of action and side effects -Hypoglycemia management: rule of 15's, Glucagon Emergency Kit and medical alert ID -low-carb low-fat plate-method diet -At least 20 minutes of physical activity per day -Annual dilated retinal eye exam and foot exam -compliance and follow up needs -follow up as scheduled or earlier if problem gets worse  Call if blood sugar is less than 70 or consistently above 250    Take a 15 gm snack of carbohydrate at bedtime before you go to sleep if your blood sugar is less than 100.    If you are going to fast after midnight for a test or procedure, ask your physician for instructions on how to reduce/decrease your insulin  dose.    Call if blood sugar is less than 70 or consistently above 250  -Treating  a low sugar by rule of 15  (15 gms of sugar every 15 min until sugar is more than 70) If you feel your sugar is low, test your sugar to be sure If your sugar is low (less than 70), then take 15 grams of a fast acting Carbohydrate (3-4 glucose tablets or glucose gel or 4 ounces of juice or regular soda) Recheck your sugar 15 min after treating low to make sure it is more than 70 If sugar is still less than 70, treat again with 15 grams of carbohydrate          Don't drive the hour of  hypoglycemia  If unconscious/unable to eat or drink by mouth, use glucagon injection or nasal spray baqsimi and call 911. Can repeat again in 15 min if still unconscious.  Return in about 2 months (around 04/12/2024).   I have reviewed current medications, nurse's notes, allergies, vital signs, past medical and surgical history, family medical history, and social history for this encounter. Counseled patient on symptoms, examination findings, lab findings, imaging results, treatment decisions and monitoring and prognosis. The patient understood the recommendations and agrees with the treatment plan. All questions regarding treatment plan were fully answered.  Obadiah Birmingham, MD  02/11/24   History of Present Illness Alex Watson is a 58 y.o. year old male who presents for follow up on Type I diabetes mellitus.  Alex Watson was first diagnosed in 2016.   Diabetes education +  Current regimen: Tresiba  46 units daily    Lispro 19-23 units around 15 min before supper   Previous history:  Insulin  was started about 3 months after his diagnosis since oral agents failed Appears to be mostly on some form of glargine insulin  along with mealtime insulin   COMPLICATIONS -  MI/Stroke -  retinopathy -  neuropathy -  nephropathy  BLOOD SUGAR DATA CGM interpretation: At today's visit, we reviewed her CGM downloads. The full report is scanned in the media. Reviewing the CGM trends, BG are high variable from very low to very high, but average mildly high overnight and in evening.  Physical Exam  BP 98/60   Pulse 75   Resp 16   Ht 6' 5 (1.956 m)   Wt 227 lb 3.2 oz (103.1 kg)   SpO2 95%   BMI 26.94 kg/m    Constitutional: well developed, well nourished Head: normocephalic, atraumatic Eyes: sclera anicteric, no redness Neck: supple Lungs: normal respiratory effort Neurology: alert and oriented Skin: dry, no appreciable rashes Musculoskeletal: no appreciable defects Psychiatric:  normal mood and affect Diabetic Foot Exam - Simple   No data filed      Current Medications Patient's Medications  New Prescriptions   TIRZEPATIDE (MOUNJARO) 2.5 MG/0.5ML PEN    Inject 2.5 mg into the skin once a week.  Previous Medications   ALBUTEROL  (VENTOLIN  HFA) 108 (90 BASE) MCG/ACT INHALER    Inhale 1-2 puffs into the lungs every 6 (six) hours as needed for wheezing or shortness of breath.   ATORVASTATIN  (LIPITOR) 40 MG TABLET    TAKE 1 TABLET(40 MG) BY MOUTH DAILY   CONTINUOUS GLUCOSE SENSOR (DEXCOM G7 SENSOR) MISC    Change every 10 days   INSULIN  LISPRO (HUMALOG  KWIKPEN) 100 UNIT/ML KWIKPEN    Inject 22-24 Units into the skin in the morning and at bedtime.   INSULIN  SYRINGE-NEEDLE U-100 (B-D INS SYR ULTRAFINE 1CC/30G) 30G X 1/2 1 ML MISC    USE 1 SYRINGE/NEEDLE DAILY   NAPROXEN  (  NAPROSYN ) 500 MG TABLET    Take 1 tablet (500 mg total) by mouth 2 (two) times daily with a meal.   TRAZODONE  (DESYREL ) 50 MG TABLET    TAKE 1/2 TO 1 TABLET(25 TO 50 MG) BY MOUTH AT BEDTIME AS NEEDED FOR SLEEP  Modified Medications   No medications on file  Discontinued Medications   No medications on file    Allergies Allergies  Allergen Reactions   Morphine     REACTION: difficulty breathing    Past Medical History Past Medical History:  Diagnosis Date   Allergy    Diabetes mellitus without complication (HCC)    Uncontrolled daytime somnolence 03/07/2013    Past Surgical History Past Surgical History:  Procedure Laterality Date   MANDIBLE SURGERY  1987    Family History family history includes Arthritis in his mother; Asthma in his mother; COPD in his father; Diabetes in his father; Heart disease in his sister; Lupus in his sister.  Social History Social History   Socioeconomic History   Marital status: Married    Spouse name: Not on file   Number of children: 2   Years of education: Not on file   Highest education level: Associate degree: occupational, Scientist, product/process development, or  vocational program  Occupational History   Occupation: [employer]    Comment: [employer]  Tobacco Use   Smoking status: Every Day    Current packs/day: 0.50    Average packs/day: 1 pack/day for 34.8 years (33.9 ttl pk-yrs)    Types: Cigarettes    Start date: 2024   Smokeless tobacco: Never  Vaping Use   Vaping status: Never Used  Substance and Sexual Activity   Alcohol use: Yes    Comment: BEER   Drug use: No   Sexual activity: Yes    Comment: number of sex partners in the last 12 months 1  Other Topics Concern   Not on file  Social History Narrative   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)   Social Drivers of Corporate investment banker Strain: Low Risk  (08/31/2023)   Overall Financial Resource Strain (CARDIA)    Difficulty of Paying Living Expenses: Not hard at all  Food Insecurity: No Food Insecurity (08/31/2023)   Hunger Vital Sign    Worried About Running Out of Food in the Last Year: Never true    Ran Out of Food in the Last Year: Never true  Transportation Needs: No Transportation Needs (08/31/2023)   PRAPARE - Administrator, Civil Service (Medical): No    Lack of Transportation (Non-Medical): No  Physical Activity: Unknown (08/31/2023)   Exercise Vital Sign    Days of Exercise per Week: 2 days    Minutes of Exercise per Session: Patient declined  Stress: No Stress Concern Present (08/31/2023)   [employer] of Occupational Health - Occupational Stress Questionnaire    Feeling of Stress : Only a little  Social Connections: Moderately Isolated (08/31/2023)   Social Connection and Isolation Panel    Frequency of Communication with Friends and Family: More than three times a week    Frequency of Social Gatherings with Friends and Family: Once a week    Attends Religious Services: Never    Database administrator or Organizations: No    Attends Engineer, structural: Not on file    Marital Status: Married  Catering manager Violence: Not  on file    Lab Results  Component Value Date   HGBA1C 7.4 (A)  12/17/2023   HGBA1C 8.0 (A) 07/26/2023   HGBA1C 8.0 (A) 02/22/2023   Lab Results  Component Value Date   CHOL 122 02/07/2024   Lab Results  Component Value Date   HDL 48 02/07/2024   Lab Results  Component Value Date   LDLCALC 58 02/07/2024   Lab Results  Component Value Date   TRIG 76 02/07/2024   Lab Results  Component Value Date   CHOLHDL 2.5 02/07/2024   Lab Results  Component Value Date   CREATININE 0.78 02/07/2024   Lab Results  Component Value Date   GFR 93.88 11/16/2022   Lab Results  Component Value Date   MICROALBUR <0.2 02/07/2024       Component Value Date/Time   NA 139 02/07/2024 0749   K 4.4 02/07/2024 0749   CL 104 02/07/2024 0749   CO2 28 02/07/2024 0749   GLUCOSE 112 (H) 02/07/2024 0749   BUN 12 02/07/2024 0749   CREATININE 0.78 02/07/2024 0749   CALCIUM  9.4 02/07/2024 0749   PROT 7.0 02/07/2024 0749   ALBUMIN 4.4 11/20/2022 0855   AST 16 02/07/2024 0749   ALT 18 02/07/2024 0749   ALKPHOS 87 11/20/2022 0855   BILITOT 0.5 02/07/2024 0749   GFRNONAA >60 01/08/2015 1736   GFRAA >60 01/08/2015 1736      Latest Ref Rng & Units 02/07/2024    7:49 AM 11/16/2022    9:27 AM 09/04/2022    9:07 AM  BMP  Glucose 65 - 99 mg/dL 887  70  837   BUN 7 - 25 mg/dL 12  10    Creatinine 9.29 - 1.30 mg/dL 9.21  9.07    BUN/Creat Ratio 6 - 22 (calc) SEE NOTE:     Sodium 135 - 146 mmol/L 139  139    Potassium 3.5 - 5.3 mmol/L 4.4  4.6    Chloride 98 - 110 mmol/L 104  102    CO2 20 - 32 mmol/L 28  28    Calcium  8.6 - 10.3 mg/dL 9.4  9.7         Component Value Date/Time   WBC 8.3 05/03/2019 0833   RBC 4.96 05/03/2019 0833   HGB 16.4 05/03/2019 0833   HCT 48.7 05/03/2019 0833   PLT 317.0 05/03/2019 0833   MCV 98.2 05/03/2019 0833   MCV 92.3 09/30/2017 1546   MCH 31.5 (A) 09/30/2017 1546   MCH 32.6 01/08/2015 1736   MCHC 33.7 05/03/2019 0833   RDW 14.0 05/03/2019 0833   LYMPHSABS  2.5 01/08/2015 1736   MONOABS 0.6 01/08/2015 1736   EOSABS 0.2 01/08/2015 1736   BASOSABS 0.0 01/08/2015 1736     Parts of this note may have been dictated using voice recognition software. There may be variances in spelling and vocabulary which are unintentional. Not all errors are proofread. Please notify the dino if any discrepancies are noted or if the meaning of any statement is not clear.

## 2024-02-11 NOTE — Patient Instructions (Addendum)
 Tresiba  46 units daily    Lispro 19-23 units around 15 min before supper  Lispro correction scale: Use in addition to your meal time/short acting insulin  based on blood sugars as follows:  151 - 190: 1 unit 191 - 230: 2 units 231 - 270: 3 units 271 - 310: 4 units 311 - 350: 5 units 351 - 390: 6 units 391 - 430: 7 units

## 2024-02-17 LAB — COMPREHENSIVE METABOLIC PANEL WITH GFR
AG Ratio: 2 (calc) (ref 1.0–2.5)
ALT: 18 U/L (ref 9–46)
AST: 16 U/L (ref 10–35)
Albumin: 4.7 g/dL (ref 3.6–5.1)
Alkaline phosphatase (APISO): 93 U/L (ref 35–144)
BUN: 12 mg/dL (ref 7–25)
CO2: 28 mmol/L (ref 20–32)
Calcium: 9.4 mg/dL (ref 8.6–10.3)
Chloride: 104 mmol/L (ref 98–110)
Creat: 0.78 mg/dL (ref 0.70–1.30)
Globulin: 2.3 g/dL (ref 1.9–3.7)
Glucose, Bld: 112 mg/dL — ABNORMAL HIGH (ref 65–99)
Potassium: 4.4 mmol/L (ref 3.5–5.3)
Sodium: 139 mmol/L (ref 135–146)
Total Bilirubin: 0.5 mg/dL (ref 0.2–1.2)
Total Protein: 7 g/dL (ref 6.1–8.1)
eGFR: 105 mL/min/1.73m2 (ref >=60)

## 2024-02-17 LAB — DIABETES TYPE 1 AUTOANTIBODY PANEL
Glutamic Acid Decarb Ab: 6 [IU]/mL — ABNORMAL HIGH (ref <5)
IA-2 Antibody: <5.4 U/mL (ref <5.4)
Insulin Antibodies, Human: <0.4 U/mL (ref <0.4)
ZINC TRANSPORTER 8 (ZnT8) ANTIBODY: <10 U/mL (ref <15)

## 2024-02-17 LAB — LIPID PANEL
Cholesterol: 122 mg/dL (ref <200)
HDL: 48 mg/dL (ref >=40)
LDL Cholesterol (Calc): 58 mg/dL
Non-HDL Cholesterol (Calc): 74 mg/dL (ref <130)
Total CHOL/HDL Ratio: 2.5 (calc) (ref <5.0)
Triglycerides: 76 mg/dL (ref <150)

## 2024-02-17 LAB — MICROALBUMIN / CREATININE URINE RATIO
Creatinine, Urine: 10 mg/dL — ABNORMAL LOW (ref 20–320)
Microalb, Ur: <0.2 mg/dL

## 2024-02-17 LAB — C-PEPTIDE: C-Peptide: 0.18 ng/mL — ABNORMAL LOW (ref 0.80–3.85)

## 2024-03-21 ENCOUNTER — Other Ambulatory Visit: Payer: Self-pay | Admitting: "Endocrinology

## 2024-03-21 DIAGNOSIS — E1065 Type 1 diabetes mellitus with hyperglycemia: Secondary | ICD-10-CM

## 2024-03-22 NOTE — Telephone Encounter (Signed)
 Requested Prescriptions   Pending Prescriptions Disp Refills   Continuous Glucose Sensor (DEXCOM G7 SENSOR) MISC [Pharmacy Med Name: DEXCOM G7 SENSOR] 9 each 2    Sig: CHANGE EVERY 10 DAYS

## 2024-03-27 ENCOUNTER — Ambulatory Visit: Admitting: Skilled Nursing Facility1

## 2024-04-28 ENCOUNTER — Ambulatory Visit: Admitting: "Endocrinology

## 2024-04-28 VITALS — BP 110/70 | HR 86 | Ht 77.0 in | Wt 218.0 lb

## 2024-04-28 DIAGNOSIS — E1065 Type 1 diabetes mellitus with hyperglycemia: Secondary | ICD-10-CM | POA: Diagnosis not present

## 2024-04-28 DIAGNOSIS — E663 Overweight: Secondary | ICD-10-CM

## 2024-04-28 DIAGNOSIS — E7800 Pure hypercholesterolemia, unspecified: Secondary | ICD-10-CM

## 2024-04-28 LAB — POCT GLYCOSYLATED HEMOGLOBIN (HGB A1C): Hemoglobin A1C: 7.4 % — AB (ref 4.0–5.6)

## 2024-04-28 NOTE — Progress Notes (Addendum)
 "   Outpatient Endocrinology Note Alex Birmingham, MD  04/28/2024   Alex Watson September 03, 1965 [id number]  Referring Provider: Thedora Garnette HERO, MD Primary Care Provider: Thedora Garnette HERO, MD Reason for consultation: Subjective   Assessment & Plan  Diagnoses and all orders for this visit:  Uncontrolled type 1 diabetes mellitus with hyperglycemia (HCC) -     POCT glycosylated hemoglobin (Hb A1C)  Pure hypercholesterolemia  Overweight   Diabetes Type I complicated by hyperglycemia   Lab Results  Component Value Date   GFR 93.88 11/16/2022   Hba1c goal less than 7, current Hba1c is  Lab Results  Component Value Date   HGBA1C 7.4 (A) 04/28/2024   Tresiba  44 units daily    Lispro 18-20 units around 15 min before supper  (take additional 5 units if BG >200 at bedtime).  Takes 3 to 5 units when he wakes up at 4 AM for correction Correction scale: Use in addition to your meal time/short acting insulin  based on blood sugars as follows:  151 - 190: 1 unit 191 - 230: 2 units 231 - 270: 3 units 271 - 310: 4 units 311 - 350: 5 units 351 - 390: 6 units 391 - 430: 7 units  02/11/24: Started mounajro 2.5mg /week to help decrease BG variability Ordered DM education  Instructed pt to cut down carb consumption variability and take lispro 15 min before meals (take half the dose of BG before meals is less than 100 and skip it if BG is less than 70) Denies insulin  pump  No history of MEN syndrome/medullary thyroid  cancer/pancreatitis or pancreatic cancer in self or family Instructed to eat 3 equal sized meals rather than 1 big and 2 minor meals -patient continues to eat biggest supper meal leading to hyperglycemia 4 hours after the meal.  No known contraindications to any of above medications  -Last LD and Tg are as follows: Lab Results  Component Value Date   LDLCALC 58 02/07/2024    Lab Results  Component Value Date   TRIG 76 02/07/2024   -On atorvastatin  40 mg every  day -Follow low fat diet and exercise   -Blood pressure goal <140/90 - Microalbumin/creatinine goal < 30 -Last MA/Cr is as follows: Lab Results  Component Value Date   MICROALBUR <0.2 02/07/2024    -not on ACE/ARB -diet changes including salt restriction -limit eating outside -counseled BP targets per standards of diabetes care -uncontrolled blood pressure can lead to retinopathy, nephropathy and cardiovascular and atherosclerotic heart disease  Reviewed and counseled on: -A1C target -Blood sugar targets -Complications of uncontrolled diabetes  -Checking blood sugar before meals and bedtime and bring log next visit -All medications with mechanism of action and side effects -Hypoglycemia management: rule of 15's, Glucagon Emergency Kit and medical alert ID -low-carb low-fat plate-method diet -At least 20 minutes of physical activity per day -Annual dilated retinal eye exam and foot exam -compliance and follow up needs -follow up as scheduled or earlier if problem gets worse  Call if blood sugar is less than 70 or consistently above 250    Take a 15 gm snack of carbohydrate at bedtime before you go to sleep if your blood sugar is less than 100.    If you are going to fast after midnight for a test or procedure, ask your physician for instructions on how to reduce/decrease your insulin  dose.    Call if blood sugar is less than 70 or consistently above 250  -Treating a low sugar  by rule of 15  (15 gms of sugar every 15 min until sugar is more than 70) If you feel your sugar is low, test your sugar to be sure If your sugar is low (less than 70), then take 15 grams of a fast acting Carbohydrate (3-4 glucose tablets or glucose gel or 4 ounces of juice or regular soda) Recheck your sugar 15 min after treating low to make sure it is more than 70 If sugar is still less than 70, treat again with 15 grams of carbohydrate          Don't drive the hour of hypoglycemia  If  unconscious/unable to eat or drink by mouth, use glucagon injection or nasal spray baqsimi and call 911. Can repeat again in 15 min if still unconscious.  Return in about 3 months (around 07/27/2024).   I have reviewed current medications, nurse's notes, allergies, vital signs, past medical and surgical history, family medical history, and social history for this encounter. Counseled patient on symptoms, examination findings, lab findings, imaging results, treatment decisions and monitoring and prognosis. The patient understood the recommendations and agrees with the treatment plan. All questions regarding treatment plan were fully answered.  Alex Birmingham, MD  04/28/2024   History of Present Illness Alex Watson is a 58 y.o. year old male who presents for follow up on Type I diabetes mellitus.  Alex Watson was first diagnosed in 2016.   Diabetes education +  Current regimen: Tresiba  44 units daily    Lispro 19-21 units around 15 min before supper  (take additional 5 units if BG >200 at bedtime) Correction scale: Use in addition to your meal time/short acting insulin  based on blood sugars as follows:  151 - 190: 1 unit 191 - 230: 2 units 231 - 270: 3 units 271 - 310: 4 units 311 - 350: 5 units 351 - 390: 6 units 391 - 430: 7 units  Previous history:  Insulin  was started about 3 months after his diagnosis since oral agents failed Appears to be mostly on some form of glargine insulin  along with mealtime insulin   COMPLICATIONS -  MI/Stroke -  retinopathy -  neuropathy -  nephropathy  BLOOD SUGAR DATA CGM interpretation: At today's visit, we reviewed her CGM downloads. The full report is scanned in the media. Reviewing the CGM trends, BG are low around 3 PM, again around 9 PM till 2 AM, high between 2 AM till 6 AM, and improve mostly in between with some highs.  Physical Exam  BP 110/70   Pulse 86   Ht 6' 5 (1.956 m)   Wt 218 lb (98.9 kg)   SpO2 98%   BMI 25.85 kg/m     Constitutional: well developed, well nourished Head: normocephalic, atraumatic Eyes: sclera anicteric, no redness Neck: supple Lungs: normal respiratory effort Neurology: alert and oriented Skin: dry, no appreciable rashes Musculoskeletal: no appreciable defects Psychiatric: normal mood and affect Diabetic Foot Exam - Simple   No data filed      Current Medications Patient's Medications  New Prescriptions   No medications on file  Previous Medications   ALBUTEROL  (VENTOLIN  HFA) 108 (90 BASE) MCG/ACT INHALER    Inhale 1-2 puffs into the lungs every 6 (six) hours as needed for wheezing or shortness of breath.   ATORVASTATIN  (LIPITOR) 40 MG TABLET    TAKE 1 TABLET(40 MG) BY MOUTH DAILY   CONTINUOUS GLUCOSE SENSOR (DEXCOM G7 SENSOR) MISC    CHANGE EVERY 10 DAYS  INSULIN  LISPRO (HUMALOG  KWIKPEN) 100 UNIT/ML KWIKPEN    Inject 22-24 Units into the skin in the morning and at bedtime.   INSULIN  SYRINGE-NEEDLE U-100 (B-D INS SYR ULTRAFINE 1CC/30G) 30G X 1/2 1 ML MISC    USE 1 SYRINGE/NEEDLE DAILY   NAPROXEN  (NAPROSYN ) 500 MG TABLET    Take 1 tablet (500 mg total) by mouth 2 (two) times daily with a meal.   TIRZEPATIDE  (MOUNJARO ) 2.5 MG/0.5ML PEN    Inject 2.5 mg into the skin once a week.   TRAZODONE  (DESYREL ) 50 MG TABLET    TAKE 1/2 TO 1 TABLET(25 TO 50 MG) BY MOUTH AT BEDTIME AS NEEDED FOR SLEEP  Modified Medications   No medications on file  Discontinued Medications   No medications on file    Allergies Allergies  Allergen Reactions   Morphine     REACTION: difficulty breathing    Past Medical History Past Medical History:  Diagnosis Date   Allergy    Diabetes mellitus without complication (HCC)    Uncontrolled daytime somnolence 03/07/2013    Past Surgical History Past Surgical History:  Procedure Laterality Date   MANDIBLE SURGERY  1987    Family History family history includes Arthritis in his mother; Asthma in his mother; COPD in his father; Diabetes in his  father; Heart disease in his sister; Lupus in his sister.  Social History Social History   Socioeconomic History   Marital status: Married    Spouse name: Not on file   Number of children: 2   Years of education: Not on file   Highest education level: Associate degree: occupational, scientist, product/process development, or vocational program  Occupational History   Occupation: [employer]    Comment: [employer]  Tobacco Use   Smoking status: Every Day    Current packs/day: 0.50    Average packs/day: 1 pack/day for 35.0 years (34.0 ttl pk-yrs)    Types: Cigarettes    Start date: 2024   Smokeless tobacco: Never  Vaping Use   Vaping status: Never Used  Substance and Sexual Activity   Alcohol use: Yes    Comment: BEER   Drug use: No   Sexual activity: Yes    Comment: number of sex partners in the last 12 months 1  Other Topics Concern   Not on file  Social History Narrative   Former [employer] (6 years)   Social Drivers of Health   Tobacco Use: High Risk (04/28/2024)   Patient History    Smoking Tobacco Use: Every Day    Smokeless Tobacco Use: Never    Passive Exposure: Not on file  Financial Resource Strain: Low Risk (08/31/2023)   Overall Financial Resource Strain (CARDIA)    Difficulty of Paying Living Expenses: Not hard at all  Food Insecurity: No Food Insecurity (08/31/2023)   Hunger Vital Sign    Worried About Running Out of Food in the Last Year: Never true    Ran Out of Food in the Last Year: Never true  Transportation Needs: No Transportation Needs (08/31/2023)   PRAPARE - Administrator, Civil Service (Medical): No    Lack of Transportation (Non-Medical): No  Physical Activity: Unknown (08/31/2023)   Exercise Vital Sign    Days of Exercise per Week: 2 days    Minutes of Exercise per Session: Patient declined  Stress: No Stress Concern Present (08/31/2023)   [employer] of Occupational Health - Occupational Stress Questionnaire    Feeling of Stress :  Only a  little  Social Connections: Moderately Isolated (08/31/2023)   Social Connection and Isolation Panel    Frequency of Communication with Friends and Family: More than three times a week    Frequency of Social Gatherings with Friends and Family: Once a week    Attends Religious Services: Never    Database Administrator or Organizations: No    Attends Engineer, Structural: Not on file    Marital Status: Married  Catering Manager Violence: Not on file  Depression (PHQ2-9): Low Risk (01/13/2023)   Depression (PHQ2-9)    PHQ-2 Score: 0  Alcohol Screen: Not on file  Housing: Low Risk (08/31/2023)   Housing Stability Vital Sign    Unable to Pay for Housing in the Last Year: No    Number of Times Moved in the Last Year: 0    Homeless in the Last Year: No  Utilities: Not on file  Health Literacy: Not on file    Lab Results  Component Value Date   HGBA1C 7.4 (A) 04/28/2024   HGBA1C 7.4 (A) 12/17/2023   HGBA1C 8.0 (A) 07/26/2023   Lab Results  Component Value Date   CHOL 122 02/07/2024   Lab Results  Component Value Date   HDL 48 02/07/2024   Lab Results  Component Value Date   LDLCALC 58 02/07/2024   Lab Results  Component Value Date   TRIG 76 02/07/2024   Lab Results  Component Value Date   CHOLHDL 2.5 02/07/2024   Lab Results  Component Value Date   CREATININE 0.78 02/07/2024   Lab Results  Component Value Date   GFR 93.88 11/16/2022   Lab Results  Component Value Date   MICROALBUR <0.2 02/07/2024       Component Value Date/Time   NA 139 02/07/2024 0749   K 4.4 02/07/2024 0749   CL 104 02/07/2024 0749   CO2 28 02/07/2024 0749   GLUCOSE 112 (H) 02/07/2024 0749   BUN 12 02/07/2024 0749   CREATININE 0.78 02/07/2024 0749   CALCIUM  9.4 02/07/2024 0749   PROT 7.0 02/07/2024 0749   ALBUMIN 4.4 11/20/2022 0855   AST 16 02/07/2024 0749   ALT 18 02/07/2024 0749   ALKPHOS 87 11/20/2022 0855   BILITOT 0.5 02/07/2024 0749   GFRNONAA >60 01/08/2015  1736   GFRAA >60 01/08/2015 1736      Latest Ref Rng & Units 02/07/2024    7:49 AM 11/16/2022    9:27 AM 09/04/2022    9:07 AM  BMP  Glucose 65 - 99 mg/dL 887  70  837   BUN 7 - 25 mg/dL 12  10    Creatinine 9.29 - 1.30 mg/dL 9.21  9.07    BUN/Creat Ratio 6 - 22 (calc) SEE NOTE:     Sodium 135 - 146 mmol/L 139  139    Potassium 3.5 - 5.3 mmol/L 4.4  4.6    Chloride 98 - 110 mmol/L 104  102    CO2 20 - 32 mmol/L 28  28    Calcium  8.6 - 10.3 mg/dL 9.4  9.7         Component Value Date/Time   WBC 8.3 05/03/2019 0833   RBC 4.96 05/03/2019 0833   HGB 16.4 05/03/2019 0833   HCT 48.7 05/03/2019 0833   PLT 317.0 05/03/2019 0833   MCV 98.2 05/03/2019 0833   MCV 92.3 09/30/2017 1546   MCH 31.5 (A) 09/30/2017 1546   MCH 32.6 01/08/2015 1736   MCHC 33.7  05/03/2019 0833   RDW 14.0 05/03/2019 0833   LYMPHSABS 2.5 01/08/2015 1736   MONOABS 0.6 01/08/2015 1736   EOSABS 0.2 01/08/2015 1736   BASOSABS 0.0 01/08/2015 1736     Parts of this note may have been dictated using voice recognition software. There may be variances in spelling and vocabulary which are unintentional. Not all errors are proofread. Please notify the dino if any discrepancies are noted or if the meaning of any statement is not clear.   "

## 2024-05-01 ENCOUNTER — Encounter: Payer: Self-pay | Admitting: "Endocrinology

## 2024-05-08 ENCOUNTER — Encounter: Payer: Self-pay | Admitting: "Endocrinology

## 2024-05-08 ENCOUNTER — Encounter: Payer: Self-pay | Admitting: Family Medicine

## 2024-05-09 NOTE — Telephone Encounter (Signed)
 Called pt and informed him that he can print his medication list off from his mychart for [facility].

## 2024-05-29 ENCOUNTER — Other Ambulatory Visit: Payer: Self-pay | Admitting: Family Medicine

## 2024-05-29 DIAGNOSIS — E782 Mixed hyperlipidemia: Secondary | ICD-10-CM

## 2024-06-15 ENCOUNTER — Other Ambulatory Visit: Payer: Self-pay | Admitting: Family Medicine

## 2024-06-15 DIAGNOSIS — F5101 Primary insomnia: Secondary | ICD-10-CM

## 2024-07-31 ENCOUNTER — Ambulatory Visit: Admitting: "Endocrinology
